# Patient Record
Sex: Male | Born: 1950 | Race: White | Hispanic: No | Marital: Married | Smoking: Current every day smoker
Health system: Southern US, Community
[De-identification: ages and names within clinical notes are randomized; demographics above are authoritative.]

## PROBLEM LIST (undated history)

## (undated) DIAGNOSIS — N529 Male erectile dysfunction, unspecified: Secondary | ICD-10-CM

## (undated) DIAGNOSIS — K5790 Diverticulosis of intestine, part unspecified, without perforation or abscess without bleeding: Secondary | ICD-10-CM

## (undated) DIAGNOSIS — H269 Unspecified cataract: Secondary | ICD-10-CM

## (undated) DIAGNOSIS — I10 Essential (primary) hypertension: Secondary | ICD-10-CM

## (undated) DIAGNOSIS — E119 Type 2 diabetes mellitus without complications: Secondary | ICD-10-CM

## (undated) DIAGNOSIS — E785 Hyperlipidemia, unspecified: Secondary | ICD-10-CM

## (undated) DIAGNOSIS — E669 Obesity, unspecified: Secondary | ICD-10-CM

## (undated) DIAGNOSIS — E1140 Type 2 diabetes mellitus with diabetic neuropathy, unspecified: Secondary | ICD-10-CM

## (undated) DIAGNOSIS — K635 Polyp of colon: Secondary | ICD-10-CM

## (undated) HISTORY — DX: Polyp of colon: K63.5

## (undated) HISTORY — DX: Obesity, unspecified: E66.9

## (undated) HISTORY — DX: Male erectile dysfunction, unspecified: N52.9

## (undated) HISTORY — DX: Essential (primary) hypertension: I10

## (undated) HISTORY — DX: Hyperlipidemia, unspecified: E78.5

## (undated) HISTORY — DX: Diverticulosis of intestine, part unspecified, without perforation or abscess without bleeding: K57.90

## (undated) HISTORY — DX: Unspecified cataract: H26.9

## (undated) HISTORY — DX: Type 2 diabetes mellitus with diabetic neuropathy, unspecified: E11.40

## (undated) HISTORY — PX: APPENDECTOMY: SHX54

## (undated) HISTORY — PX: TONSILLECTOMY: SUR1361

## (undated) HISTORY — DX: Type 2 diabetes mellitus without complications: E11.9

---

## 1972-01-16 HISTORY — PX: OTHER SURGICAL HISTORY: SHX169

## 1972-01-16 HISTORY — PX: TESTICLE REMOVAL: SHX68

## 1972-01-16 HISTORY — PX: INGUINAL HERNIA REPAIR: SHX194

## 1979-01-16 HISTORY — PX: VASECTOMY: SHX75

## 1984-01-16 HISTORY — PX: KNEE ARTHROSCOPY: SUR90

## 2001-04-08 ENCOUNTER — Emergency Department (HOSPITAL_COMMUNITY): Admission: EM | Admit: 2001-04-08 | Discharge: 2001-04-08 | Payer: Self-pay | Admitting: Emergency Medicine

## 2001-10-29 ENCOUNTER — Emergency Department (HOSPITAL_COMMUNITY): Admission: EM | Admit: 2001-10-29 | Discharge: 2001-10-29 | Payer: Self-pay | Admitting: Emergency Medicine

## 2002-01-07 ENCOUNTER — Emergency Department (HOSPITAL_COMMUNITY): Admission: EM | Admit: 2002-01-07 | Discharge: 2002-01-07 | Payer: Self-pay | Admitting: Emergency Medicine

## 2002-01-07 ENCOUNTER — Encounter: Payer: Self-pay | Admitting: Emergency Medicine

## 2002-06-30 ENCOUNTER — Encounter: Admission: RE | Admit: 2002-06-30 | Discharge: 2002-06-30 | Payer: Self-pay | Admitting: Sports Medicine

## 2002-06-30 ENCOUNTER — Encounter: Payer: Self-pay | Admitting: Diagnostic Radiology

## 2002-06-30 ENCOUNTER — Encounter: Payer: Self-pay | Admitting: Sports Medicine

## 2002-07-15 ENCOUNTER — Encounter: Payer: Self-pay | Admitting: Sports Medicine

## 2002-07-15 ENCOUNTER — Encounter: Admission: RE | Admit: 2002-07-15 | Discharge: 2002-07-15 | Payer: Self-pay | Admitting: Sports Medicine

## 2002-07-29 ENCOUNTER — Encounter: Payer: Self-pay | Admitting: Sports Medicine

## 2002-07-29 ENCOUNTER — Encounter: Admission: RE | Admit: 2002-07-29 | Discharge: 2002-07-29 | Payer: Self-pay | Admitting: Sports Medicine

## 2003-06-02 ENCOUNTER — Emergency Department (HOSPITAL_COMMUNITY): Admission: EM | Admit: 2003-06-02 | Discharge: 2003-06-02 | Payer: Self-pay | Admitting: Emergency Medicine

## 2007-01-16 HISTORY — PX: REPLACEMENT UNICONDYLAR JOINT KNEE: SUR1227

## 2008-02-12 ENCOUNTER — Ambulatory Visit: Payer: Self-pay | Admitting: Family Medicine

## 2008-02-12 DIAGNOSIS — E785 Hyperlipidemia, unspecified: Secondary | ICD-10-CM | POA: Insufficient documentation

## 2008-02-12 DIAGNOSIS — E1169 Type 2 diabetes mellitus with other specified complication: Secondary | ICD-10-CM | POA: Insufficient documentation

## 2008-02-12 DIAGNOSIS — N521 Erectile dysfunction due to diseases classified elsewhere: Secondary | ICD-10-CM

## 2008-02-12 DIAGNOSIS — I10 Essential (primary) hypertension: Secondary | ICD-10-CM | POA: Insufficient documentation

## 2008-03-08 ENCOUNTER — Ambulatory Visit: Payer: Self-pay | Admitting: Gastroenterology

## 2008-03-17 ENCOUNTER — Encounter: Payer: Self-pay | Admitting: Family Medicine

## 2008-03-19 ENCOUNTER — Encounter (INDEPENDENT_AMBULATORY_CARE_PROVIDER_SITE_OTHER): Payer: Self-pay | Admitting: *Deleted

## 2008-03-19 ENCOUNTER — Ambulatory Visit: Payer: Self-pay | Admitting: Family Medicine

## 2008-03-19 LAB — CONVERTED CEMR LAB
Cholesterol: 116 mg/dL (ref 0–200)
HDL: 27.4 mg/dL — ABNORMAL LOW (ref >39.0)
LDL Cholesterol: 58 mg/dL (ref 0–99)
Total CHOL/HDL Ratio: 4.2
Triglycerides: 153 mg/dL — ABNORMAL HIGH (ref 0–149)
VLDL: 31 mg/dL (ref 0–40)

## 2008-03-22 ENCOUNTER — Encounter: Payer: Self-pay | Admitting: Gastroenterology

## 2008-03-22 ENCOUNTER — Ambulatory Visit: Payer: Self-pay | Admitting: Gastroenterology

## 2008-03-24 ENCOUNTER — Encounter: Payer: Self-pay | Admitting: Gastroenterology

## 2008-03-26 ENCOUNTER — Telehealth: Payer: Self-pay | Admitting: Family Medicine

## 2008-03-29 ENCOUNTER — Ambulatory Visit: Payer: Self-pay | Admitting: Family Medicine

## 2008-03-29 LAB — CONVERTED CEMR LAB
ALT: 26 units/L (ref 0–53)
AST: 16 units/L (ref 0–37)
Albumin: 4.1 g/dL (ref 3.5–5.2)
Alkaline Phosphatase: 107 units/L (ref 39–117)
BUN: 15 mg/dL (ref 6–23)
Basophils Absolute: 0.1 10*3/uL (ref 0.0–0.1)
Basophils Relative: 0.6 % (ref 0.0–3.0)
Bilirubin, Direct: 0.1 mg/dL (ref 0.0–0.3)
CO2: 28 meq/L (ref 19–32)
Calcium: 9 mg/dL (ref 8.4–10.5)
Chloride: 101 meq/L (ref 96–112)
Creatinine, Ser: 1 mg/dL (ref 0.4–1.5)
Creatinine,U: 163.2 mg/dL
Eosinophils Absolute: 0.1 10*3/uL (ref 0.0–0.7)
Eosinophils Relative: 1.7 % (ref 0.0–5.0)
GFR calc Af Amer: 100 mL/min
GFR calc non Af Amer: 82 mL/min
Glucose, Bld: 388 mg/dL — ABNORMAL HIGH (ref 70–99)
HCT: 48 % (ref 39.0–52.0)
Hemoglobin: 16.7 g/dL (ref 13.0–17.0)
Hgb A1c MFr Bld: 10.9 % — ABNORMAL HIGH (ref 4.6–6.0)
Lymphocytes Relative: 28.5 % (ref 12.0–46.0)
MCHC: 34.8 g/dL (ref 30.0–36.0)
MCV: 96.1 fL (ref 78.0–100.0)
Microalb Creat Ratio: 14.1 mg/g (ref 0.0–30.0)
Microalb, Ur: 2.3 mg/dL — ABNORMAL HIGH (ref 0.0–1.9)
Monocytes Absolute: 0.6 10*3/uL (ref 0.1–1.0)
Monocytes Relative: 7.2 % (ref 3.0–12.0)
Neutro Abs: 5.3 10*3/uL (ref 1.4–7.7)
Neutrophils Relative %: 62 % (ref 43.0–77.0)
Platelets: 214 10*3/uL (ref 150–400)
Potassium: 4.4 meq/L (ref 3.5–5.1)
RBC: 5 M/uL (ref 4.22–5.81)
RDW: 11.9 % (ref 11.5–14.6)
Sodium: 137 meq/L (ref 135–145)
Total Bilirubin: 1.2 mg/dL (ref 0.3–1.2)
Total Protein: 7 g/dL (ref 6.0–8.3)
WBC: 8.5 10*3/uL (ref 4.5–10.5)

## 2008-04-01 ENCOUNTER — Ambulatory Visit: Payer: Self-pay | Admitting: Family Medicine

## 2008-04-01 DIAGNOSIS — E1142 Type 2 diabetes mellitus with diabetic polyneuropathy: Secondary | ICD-10-CM | POA: Insufficient documentation

## 2008-04-01 DIAGNOSIS — E1140 Type 2 diabetes mellitus with diabetic neuropathy, unspecified: Secondary | ICD-10-CM | POA: Insufficient documentation

## 2008-04-01 DIAGNOSIS — E1165 Type 2 diabetes mellitus with hyperglycemia: Secondary | ICD-10-CM

## 2008-04-01 DIAGNOSIS — Z794 Long term (current) use of insulin: Secondary | ICD-10-CM | POA: Insufficient documentation

## 2008-05-03 ENCOUNTER — Ambulatory Visit: Payer: Self-pay | Admitting: Family Medicine

## 2008-05-05 ENCOUNTER — Telehealth: Payer: Self-pay | Admitting: Family Medicine

## 2008-06-08 ENCOUNTER — Ambulatory Visit: Payer: Self-pay | Admitting: Family Medicine

## 2008-10-05 ENCOUNTER — Ambulatory Visit: Payer: Self-pay | Admitting: Family Medicine

## 2008-10-10 LAB — CONVERTED CEMR LAB: Hgb A1c MFr Bld: 7.1 % — ABNORMAL HIGH (ref 4.6–6.5)

## 2008-10-13 ENCOUNTER — Ambulatory Visit: Payer: Self-pay | Admitting: Family Medicine

## 2009-01-13 ENCOUNTER — Ambulatory Visit: Payer: Self-pay | Admitting: Family Medicine

## 2009-01-24 ENCOUNTER — Telehealth: Payer: Self-pay | Admitting: Family Medicine

## 2009-02-17 ENCOUNTER — Ambulatory Visit: Payer: Self-pay | Admitting: Family Medicine

## 2009-02-17 DIAGNOSIS — J069 Acute upper respiratory infection, unspecified: Secondary | ICD-10-CM | POA: Insufficient documentation

## 2009-02-17 LAB — CONVERTED CEMR LAB: Rapid Strep: NEGATIVE

## 2009-04-04 ENCOUNTER — Ambulatory Visit: Payer: Self-pay | Admitting: Family Medicine

## 2009-04-05 LAB — CONVERTED CEMR LAB
ALT: 20 units/L (ref 0–53)
AST: 13 units/L (ref 0–37)
BUN: 16 mg/dL (ref 6–23)
Bilirubin, Direct: 0.1 mg/dL (ref 0.0–0.3)
CO2: 26 meq/L (ref 19–32)
Calcium: 9.5 mg/dL (ref 8.4–10.5)
Cholesterol: 137 mg/dL (ref 0–200)
Glucose, Bld: 350 mg/dL — ABNORMAL HIGH (ref 70–99)
HDL: 44.4 mg/dL (ref >39.00)
Sodium: 137 meq/L (ref 135–145)
Total Bilirubin: 0.8 mg/dL (ref 0.3–1.2)
Total Protein: 7.4 g/dL (ref 6.0–8.3)

## 2009-05-19 ENCOUNTER — Ambulatory Visit: Payer: Self-pay | Admitting: Family Medicine

## 2009-05-19 DIAGNOSIS — F17200 Nicotine dependence, unspecified, uncomplicated: Secondary | ICD-10-CM | POA: Insufficient documentation

## 2009-05-30 ENCOUNTER — Encounter: Payer: Self-pay | Admitting: Family Medicine

## 2009-06-02 ENCOUNTER — Encounter: Payer: Self-pay | Admitting: Family Medicine

## 2009-06-23 ENCOUNTER — Telehealth: Payer: Self-pay | Admitting: Family Medicine

## 2009-07-27 ENCOUNTER — Ambulatory Visit: Payer: Self-pay | Admitting: Family Medicine

## 2009-07-27 LAB — CONVERTED CEMR LAB: Hgb A1c MFr Bld: 8.1 % — ABNORMAL HIGH (ref 4.6–6.5)

## 2009-10-03 ENCOUNTER — Telehealth: Payer: Self-pay | Admitting: Family Medicine

## 2009-11-17 ENCOUNTER — Ambulatory Visit: Payer: Self-pay | Admitting: Family Medicine

## 2009-11-17 DIAGNOSIS — J157 Pneumonia due to Mycoplasma pneumoniae: Secondary | ICD-10-CM | POA: Insufficient documentation

## 2009-11-30 ENCOUNTER — Telehealth: Payer: Self-pay | Admitting: Family Medicine

## 2010-02-16 NOTE — Progress Notes (Signed)
Summary: refill request for metformin  Phone Note Refill Request Message from:  Fax from Pharmacy  Refills Requested: Medication #1:  METFORMIN HCL 500 MG XR24H-TAB 4 by mouth daily Faxed request from [facility].  Request is for 3 tablets daily, chart says 4.  Please advise.  Initial call taken by: Lowella Petties CMA,  June 23, 2009 8:46 AM  Follow-up for Phone Call        refil 4 tabs, 3 is  wrong  Call in to the pharmacy of their choice Call in #30, 11 refills. OR if they prefer a 90 day supply, #90 with 3 refills is OK, too Prescription instructions above  Follow-up by: Hannah Beat MD,  June 23, 2009 9:02 AM    Prescriptions: METFORMIN HCL 500 MG XR24H-TAB (METFORMIN HCL) 4 by mouth daily  #360 x 4   Entered by:   Delilah Shan CMA (AAMA)   Authorized by:   Hannah Beat MD   Signed by:   Delilah Shan CMA (AAMA) on 06/23/2009   Method used:   Electronically to        [facility]* (mail-order)             ,          Ph: [phone]       Fax: [phone]   RxID:   [id number]

## 2010-02-16 NOTE — Progress Notes (Signed)
Summary: Amoxicillin  Phone Note Refill Request Message from:  [facility]line on January 24, 2009 9:01 AM  Refills Requested: Medication #1:  AMOX TR-K CLV 500-125 MG TAB [facility]  [address] [id number]   Last Fill Date:  No date sent   Pharmacy Phone:  [phone]   Method Requested: Electronic Initial call taken by: Delilah Shan CMA Duncan Dull),  January 24, 2009 9:01 AM  Follow-up for Phone Call        i would not refill augmentin  ok to call in zpak, 2 by mouth today, then 1 by mouth x 4 days. #6 pills, no refills  f/u with me if not better after this Follow-up by: Hannah Beat MD,  January 24, 2009 9:20 AM  Additional Follow-up for Phone Call Additional follow up Details #1::        done Additional Follow-up by: Benny Lennert CMA Duncan Dull),  January 24, 2009 9:34 AM    New/Updated Medications: ZITHROMAX 250 MG TABS (AZITHROMYCIN) take 2 today and 1 a day for 4 days Prescriptions: ZITHROMAX 250 MG TABS (AZITHROMYCIN) take 2 today and 1 a day for 4 days  #6 x 0   Entered by:   Benny Lennert CMA (AAMA)   Authorized by:   Hannah Beat MD   Signed by:   Benny Lennert CMA (AAMA) on 01/24/2009   Method used:   [facility]        [facility]  [address] [phone][facility] (retail)       [address]       [place], [place]  [address]       Ph: [phone]       Fax: [phone]   RxID:   [id number]

## 2010-02-16 NOTE — Assessment & Plan Note (Signed)
Summary: B RONCHITIS/DLO   Vital Signs:  Patient profile:   60 year old male Height:      72.25 inches Weight:      215.0 pounds BMI:     29.06 Temp:     98.1 degrees F oral Pulse rate:   72 / minute Pulse rhythm:   regular BP sitting:   120 / 90  (left arm) Cuff size:   large  Vitals Entered By: Benny Lennert CMA Duncan Dull) (November 17, 2009 8:04 AM)  History of Present Illness: Chief complaint Bronchitis  60 year old:    Acute Visit History:      The patient complains of cough, fever, and nasal discharge.  These symptoms began 3 weeks ago.  He denies abdominal pain, chest pain, earache, headache, musculoskeletal symptoms, nausea, rash, sinus problems, and sore throat.  Other comments include: + 30 years tob.        His highest temperature has been subj.        The patient notes wheezing.  The character of the cough is described as productive.  There is no history of sleep interference, shortness of breath, respiratory retractions, tachypnea, cyanosis, or interference with oral intake associated with his cough.        Urine output has been normal.  He is tolerating clear liquids.        Allergies (verified): No Known Drug Allergies  Past History:  Past medical, surgical, family and social histories (including risk factors) reviewed, and no changes noted (except as noted below).  Past Medical History: Reviewed history from 04/01/2008 and no changes required. Diabetes Mellitus, Type 2 Hyperlipidemia Hypertension ED, rare Obesity Diverticulosis Colonic Polyps  GI = Christella Hartigan  Past Surgical History: Reviewed history from 03/24/2008 and no changes required. Appendectomy Inguinal herniorrhaphy, 1974 Tonsillectomy L hydrocoele surgery, 1974 L testicle removed, 1974 R unicondylar knee replacement, 2009 Vasectomy, 1981 Arthroscopy, R knee, 1986  Colonoscopy, 03/2008, diverticulosis, polyps, Christella Hartigan  Family History: Reviewed history from 02/12/2008 and no changes  required. Alcoholism, Drug Addiction: 0 Colon CA: 0 Ovarian/Uterine CA: 0 Breast CA: 0 Lung CA: 0 Prostate CA: 0 CAD: 0 CVA: 0 Sudden death < 50: 0 DM: Father, Sister Mental Illness: ? mother  Father, alive, 18, mouth CA Mother, alive, 15  Social History: Reviewed history from 02/12/2008 and no changes required. 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 CV: No chest pain or SOB GI: No noted N or V Otherwise, pertinent positives and negatives are noted in the HPI.   Physical Exam  Additional Exam:  GEN: A and O x 3. WDWN. NAD.    ENT: Nose clear, ext NML.  No LAD.  No JVD.  TM's clear. Oropharynx clear.  PULM: Normal WOB, no distress. scattered LL crackles B and rare wheezes CV: RRR, no M/G/R, No rubs, No JVD.   ABD: S, NT, ND, + BS. No rebound. No guarding. No HSM.   EXT: warm and well-perfused, No c/c/e. PSYCH: Pleasant and conversant.    Impression & Recommendations:  Problem # 1:  WALKING PNEUMONIA (ICD-483.0) Assessment New 3 week cover with abx fluids, cough meds  His updated medication list for this problem includes:    Doxycycline Hyclate 100 Mg Caps (Doxycycline hyclate) .[name]... Take 1 tab twice a day  Problem # 2:  TOBACCO USE (ICD-305.1)  Complete  Medication List: 1)  Ramipril 10 Mg Caps (Ramipril) .[name].. 1 by mouth q day 2)  Baby Aspirin 81mg   .... Take one tablet once daily 3)  Multivitamin  .... Take one tablet once daily 4)  Levitra 10 Mg Tabs (Vardenafil hcl) .... One by mouth 30 minutes prior to intercourse 5)  Metformin Hcl 500 Mg Xr24h-tab (Metformin hcl) .... 4 by mouth daily 6)  Glucometer Elite Classic Kit (Blood glucose monitoring suppl) .... 250.00 7)  Glucometer Elite Test Strp (Glucose blood) .... Check blood sugar twice daily 8)  Glucometer Lancets  .Marland KitchenMarland Kitchen. 250.00 9)  Caduet 5-20 Mg  Tabs (Amlodipine-atorvastatin) .[name].. 1 by mouth daily 10)  Onglyza 5 Mg Tabs (Saxagliptin hcl) .[name].. 1 by mouth daily 11)  Doxycycline Hyclate 100 Mg Caps (Doxycycline hyclate) .... Take 1 tab twice a day Prescriptions: DOXYCYCLINE HYCLATE 100 MG CAPS (DOXYCYCLINE HYCLATE) Take 1 tab twice a day  #20 x 0   Entered and Authorized by:   Hannah Beat MD   Signed by:   Hannah Beat MD on 11/17/2009   Method used:   [facility]        [facility]  [address] [phone][facility] (retail)       [address]       [place], [place]  [address]       Ph: [phone]       Fax: [phone]   RxID:   [id number]    Orders Added: 1)  Est. Patient Level IV [88416]    Current Allergies (reviewed today): No known allergies

## 2010-02-16 NOTE — Progress Notes (Signed)
Summary: doxcycline  Phone Note Refill Request Message from:  [facility] on November 30, 2009 8:25 AM  Refills Requested: Medication #1:  doxcycline   [facility] Requested[facility] [address]   Method Requested: Electronic Initial call taken by: Benny Lennert CMA Duncan Dull),  November 30, 2009 8:25 AM  Follow-up for Phone Call        Pt was being treated for pneumonia.  He still has cough with green mucous.  No fever.  I advised his wife that the cough will linger.  She says he is better but still has the green mucous.  Call wife and let her knowRosey Bath, [phone]. Follow-up by: Lowella Petties CMA, AAMA,  November 30, 2009 2:37 PM  Additional Follow-up for Phone Call Additional follow up Details #1::        Patient wife calling saying that patient will not be able to come in for the next 3 week and wants to know if this can be refilled.Consuello Masse CMA   Additional Follow-up by: Benny Lennert CMA Duncan Dull),  December 01, 2009 4:27 PM    Additional Follow-up for Phone Call Additional follow up Details #2::    i know him well, would broaden abx avelox 400 mg, 1 by mouth daily x 10 days, #10 Follow-up by: Hannah Beat MD,  December 02, 2009 10:21 AM  Additional Follow-up for Phone Call Additional follow up Details #3:: Details for Additional Follow-up Action Taken: [facility], advised his wife.            Lowella Petties CMA, AAMA  December 02, 2009 11:31 AM   New/Updated Medications: AVELOX 400 MG TABS (MOXIFLOXACIN HCL) take one by mouth daily x 10 days  Prior Medications: RAMIPRIL 10 MG CAPS (RAMIPRIL) 1 by mouth q day BABY ASPIRIN 81MG  () take one tablet once daily MULTIVITAMIN () take one tablet once daily LEVITRA 10 MG  TABS (VARDENAFIL HCL) one by mouth 30 minutes prior to intercourse METFORMIN HCL 500 MG XR24H-TAB (METFORMIN HCL) 4 by mouth daily GLUCOMETER ELITE CLASSIC  KIT (BLOOD GLUCOSE MONITORING SUPPL) 250.00 GLUCOMETER ELITE TEST  STRP (GLUCOSE  BLOOD) Check blood sugar twice daily GLUCOMETER LANCETS () 250.00 CADUET 5-20 MG TABS (AMLODIPINE-ATORVASTATIN) 1 by mouth daily ONGLYZA 5 MG TABS (SAXAGLIPTIN HCL) 1 by mouth daily AVELOX 400 MG TABS (MOXIFLOXACIN HCL) take one by mouth daily x 10 days Current Allergies: No known allergies

## 2010-02-16 NOTE — Assessment & Plan Note (Signed)
Summary: CPX/RBH   Vital Signs:  Patient profile:   60 year old male Height:      72.25 inches Weight:      209.4 pounds BMI:     28.31 Temp:     98.1 degrees F oral Pulse rate:   72 / minute Pulse rhythm:   regular BP sitting:   140 / 90  (left arm) Cuff size:   large  Vitals Entered By: Benny Lennert CMA Duncan Dull) (May 19, 2009 8:25 AM)   History of Present Illness: Chief complaint cpx  60 year old male:  DM: very out of control, A1c = 12.   Diabetes Management History:      The patient is a 60 years old male who comes in for evaluation of DM Type 2.  He is (or has been) enrolled in the "Diabetic Education Program".  He states understanding of dietary principles but he is not following the appropriate diet.  No sensory loss is reported.  Self foot exams are not being performed.  He is checking home blood sugars.  He says that he is exercising.  Type of exercise includes: walking.  Duration of exercise is estimated to be 90 min.  He is doing this 3 times per week.        Hypoglycemic symptoms are not occurring.  No hyperglycemic symptoms are reported.        Symptoms which suggest diabetic complications include sexual dysfunction and paresthesias.  No changes have been made to his treatment plan since last visit.    Preventive Screening-Counseling & Management  Alcohol-Tobacco     Smoking Status: current     Smoking Cessation Counseling: yes     Smoke Cessation Stage: contemplative     Packs/Day: 0.5     Tobacco Counseling: to quit use of tobacco products  Caffeine-Diet-Exercise     Diet Counseling: to improve diet; diet is suboptimal     Does Patient Exercise: yes     Type of exercise: walking     Exercise (avg: min/session): 90 min.     Times/week: 3  Hep-HIV-STD-Contraception     STD Risk: no risk noted     Testicular SE Education/Counseling to perform regular STE      Sexual History:  currently monogamous.        Drug Use:  never.    Clinical Review  Panels:  Prevention   Last Colonoscopy:  Location:  [facility].  (03/22/2008)   Last PSA:  1.23 (04/04/2009)  Lipid Management   Cholesterol:  137 (04/04/2009)   LDL (bad choesterol):  56 (04/04/2009)   HDL (good cholesterol):  44.40 (04/04/2009)  Diabetes Management   HgBA1C:  12.4 (04/04/2009)   Creatinine:  0.7 (04/04/2009)   Last Dilated Eye Exam:  rec exam (05/19/2009)   Last Foot Exam:  yes (05/19/2009)  CBC   WBC:  8.5 (03/29/2008)   RBC:  5.00 (03/29/2008)   Hgb:  16.7 (03/29/2008)   Hct:  48.0 (03/29/2008)   Platelets:  214 (03/29/2008)   MCV  96.1 (03/29/2008)   MCHC  34.8 (03/29/2008)   RDW  11.9 (03/29/2008)   PMN:  62.0 (03/29/2008)   Lymphs:  28.5 (03/29/2008)   Monos:  7.2 (03/29/2008)   Eosinophils:  1.7 (03/29/2008)   Basophil:  0.6 (03/29/2008)  Complete Metabolic Panel   Glucose:  350 (04/04/2009)   Sodium:  137 (04/04/2009)   Potassium:  4.9 (04/04/2009)   Chloride:  107 (04/04/2009)   CO2:  26 (04/04/2009)   BUN:  16 (04/04/2009)   Creatinine:  0.7 (04/04/2009)   Albumin:  4.3 (04/04/2009)   Total Protein:  7.4 (04/04/2009)   Calcium:  9.5 (04/04/2009)   Total Bili:  0.8 (04/04/2009)   Alk Phos:  74 (04/04/2009)   SGPT (ALT):  20 (04/04/2009)   SGOT (AST):  13 (04/04/2009)   Allergies (verified): No Known Drug Allergies  Past History:  Past medical, surgical, family and social histories (including risk factors) reviewed, and no changes noted (except as noted below).  Past Medical History: Reviewed history from 04/01/2008 and no changes required. Diabetes Mellitus, Type 2 Hyperlipidemia Hypertension ED, rare Obesity Diverticulosis Colonic Polyps  GI = Christella Hartigan  Past Surgical History: Reviewed history from 03/24/2008 and no changes required. Appendectomy Inguinal herniorrhaphy, 1974 Tonsillectomy L hydrocoele surgery, 1974 L testicle removed, 1974 R unicondylar knee replacement, 2009 Vasectomy,  1981 Arthroscopy, R knee, 1986  Colonoscopy, 03/2008, diverticulosis, polyps, Christella Hartigan  Family History: Reviewed history from 02/12/2008 and no changes required. Alcoholism, Drug Addiction: 0 Colon CA: 0 Ovarian/Uterine CA: 0 Breast CA: 0 Lung CA: 0 Prostate CA: 0 CAD: 0 CVA: 0 Sudden death < 50: 0 DM: Father, Sister Mental Illness: ? mother  Father, alive, 16, mouth CA Mother, alive, 21  Social History: Reviewed history from 02/12/2008 and no changes required. 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 Rare ETOH, No Drugs Regular exercise-no Smoking Status:  current Packs/Day:  0.5 STD Risk:  no risk noted Sexual History:  currently monogamous Drug Use:  never  Diabetes Management Exam:    Foot Exam (with socks and/or shoes not present):       Sensory-Pinprick/Light touch:          Left medial foot (L-4): normal          Left dorsal foot (L-5): normal          Left lateral foot (S-1): normal          Right medial foot (L-4): normal          Right dorsal foot (L-5): normal          Right lateral foot (S-1): normal       Sensory-Monofilament:          Left foot: normal          Right foot: normal       Inspection:          Left foot: normal          Right foot: normal       Nails:          Left foot: normal          Right foot: normal    Eye Exam:       Eye Exam done elsewhere          Date: 05/19/2009          Results: rec exam   Impression & Recommendations:  Problem # 1:  HEALTH MAINTENANCE EXAM (ICD-V70.0) The patient's preventative maintenance and recommended screening tests for an annual wellness exam were reviewed in full today. Brought up to date unless services declined.  Counselled on the importance of diet, exercise, and its role in overall health and mortality. The patient's FH and SH was reviewed, including their home life, tobacco status, and drug and alcohol status.   Stop smoking, exercise more.    Problem # 2:  AODM (ICD-250.00) Assessment: Deteriorated Doing poorly add onglyza and  f/u  His updated medication list for this problem includes:    Ramipril 10 Mg Caps (Ramipril) .[name]... 1 by mouth q day    Metformin Hcl 500 Mg Xr24h-tab (Metformin hcl) .Marland KitchenMarland KitchenMarland KitchenMarland Kitchen 4 by mouth daily    Onglyza 5 Mg Tabs (Saxagliptin hcl) .[name]... 1 by mouth daily  Labs Reviewed: Creat: 0.7 (04/04/2009)     Last Eye Exam: rec exam (05/19/2009) Reviewed HgBA1c results: 12.4 (04/04/2009)  7.1 (10/05/2008)  Problem # 3:  TOBACCO USE (ICD-305.1) The patient does smoke cigarettes, and we discussed that tobacco is harmful to one's overall health, and that likely quitting smoking would be the single best thing that they could do for their health.   Problem # 4:  ERECTILE DYSFUNCTION (ICD-302.72) Assessment: Unchanged  His updated medication list for this problem includes:    Levitra 10 Mg Tabs (Vardenafil hcl) ..... One by mouth 30 minutes prior to intercourse  Problem # 5:  HYPERLIPIDEMIA (ICD-272.4)  His updated medication list for this problem includes:    Caduet 5-20 Mg Tabs (Amlodipine-atorvastatin) .[name]... 1 by mouth daily  Problem # 6:  HYPERTENSION (ICD-401.9) Assessment: Deteriorated Patient reports that this an anomally and has been normal at home will recheck at next office visit  His updated medication list for this problem includes:    Ramipril 10 Mg Caps (Ramipril) .[name]... 1 by mouth q day    Caduet 5-20 Mg Tabs (Amlodipine-atorvastatin) .[name]... 1 by mouth daily  BP today: 140/90 Prior BP: 142/64 (02/17/2009)  Labs Reviewed: K+: 4.9 (04/04/2009) Creat: : 0.7 (04/04/2009)   Chol: 137 (04/04/2009)   HDL: 44.40 (04/04/2009)   LDL: 56 (04/04/2009)   TG: 182.0 (04/04/2009)  Complete Medication List: 1)  Ramipril 10 Mg Caps (Ramipril) .[name].. 1 by mouth q day 2)  Baby Aspirin 81mg   .... Take one tablet once daily 3)  Multivitamin  .... Take one tablet once daily 4)  Levitra 10 Mg Tabs (Vardenafil  hcl) .... One by mouth 30 minutes prior to intercourse 5)  Metformin Hcl 500 Mg Xr24h-tab (Metformin hcl) .... 4 by mouth daily 6)  Glucometer Elite Classic Kit (Blood glucose monitoring suppl) .... 250.00 7)  Glucometer Elite Test Strp (Glucose blood) .... Check blood sugar twice daily 8)  Glucometer Lancets  .Marland KitchenMarland Kitchen. 250.00 9)  Caduet 5-20 Mg Tabs (Amlodipine-atorvastatin) .[name].. 1 by mouth daily 10)  Onglyza 5 Mg Tabs (Saxagliptin hcl) .[name].. 1 by mouth daily  Diabetes Management Assessment/Plan:      The following lipid goals have been established for the patient: Total cholesterol goal of 200; LDL cholesterol goal of 100; HDL cholesterol goal of 40; Triglyceride goal of 200.    Patient Instructions: 1)  f/u 1 month Prescriptions: ONGLYZA 5 MG TABS (SAXAGLIPTIN HCL) 1 by mouth daily  #30 x 1   Entered and Authorized by:   Hannah Beat MD   Signed by:   Hannah Beat MD on 05/19/2009   Method used:   [facility]  [address] [phone][facility] ([facility]ail)       [address]       [place], [place]  [address]       Ph: [phone]       Fax: [phone]   RxID:   [id number]   Current Allergies (reviewed today): No known allergies     Review of  Systems General: denies fatigue, malaise, fever, weight loss Eyes: denies blurring,  diplopia, irritation, discharge Ear/Nose/Throat: denies ear pain or discharge, nasal obstruction or discharge, sore throat Cardiovascular: denies chest pain, palpitations, paroxysmal nocturnal dyspnea, orthopnea, edema Respiratory: denies coughing, wheezing, dyspnea, hemoptysis Gastrointestinal: denies abdominal pain, dysphagia, nausea, vomiting, diarrhea, constipation Genitourinary: denies hematuria, frequency, urgency, dysuria, discharge, impotence, incontinence Musculoskeletal: denies back pain, joint swelling, joint stiffness, joint pain Skin: denies rashes, itching, lumps, sores, lesions, color change Neurologic:  denies syncope, seizures, transient paralysis, weakness, SOME OCC PARASTHESIAS ON BOTTOM OF FEET Psychiatric: denies depression, anxiety, mental disturbance, difficulty sleeping, suicidal ideation, hallucinations, paranoia Endocrine: denies polyuria, polydipsia, polyphagia, weight change, heat or cold intolerance Heme/Lymphatic: denies easy or excessive bruising, history of blood transfusions, anemia, bleeding disorders, adenopathy, chills, sweats Allergic/Immunologic: denies urticaria, hay fever, frequent UTIs; denies HIV high risk behaviors   Physical Exam General Appearance: well developed, well nourished, no acute distress Eyes: conjunctiva and lids normal, PERRLA, EOMI Ears, Nose, Mouth, Throat: TM clear, nares clear, oral exam WNL Neck: supple, no lymphadenopathy, no thyromegaly, no JVD Respiratory: clear to auscultation and percussion, respiratory effort normal Cardiovascular: regular rate and rhythm, S1-S2, no murmur, rub or gallop, no bruits, peripheral pulses normal and symmetric, no cyanosis, clubbing, edema or varicosities Chest: no scars, masses, tenderness; no asymmetry, skin changes, nipple discharge, no gynecomastia   Gastrointestinal: soft, non-tender; no hepatosplenomegaly, masses; active bowel sounds all quadrants,  no masses, tenderness, hemorrhoids  Genitourinary: no hernia, testicular mass, penile discharge, priapism or prostate enlargement Lymphatic: no cervical, axillary or inguinal adenopathy Musculoskeletal: gait normal, muscle tone and strength WNL, no joint swelling, effusions, discoloration, crepitus  Skin: clear, good turgor, color WNL, no rashes, lesions, or ulcerations Neurologic: normal mental status, normal reflexes, normal strength, sensation, and motion Psychiatric: alert; oriented to person, place and time Other Exam:

## 2010-02-16 NOTE — Assessment & Plan Note (Signed)
Summary: 1 M F/U DLO   Vital Signs:  Patient profile:   60 year old male Height:      72.25 inches Weight:      210.6 pounds BMI:     28.47 Temp:     98.6 degrees F oral Pulse rate:   72 / minute Pulse rhythm:   regular BP sitting:   140 / 100  (left arm) Cuff size:   large  Vitals Entered By: Benny Lennert CMA Duncan Dull) (July 27, 2009 8:10 AM)  History of Present Illness: Chief complaint 1 month follow up  DM: improved, looking at [facility] compared to prior A1c of 12, needs recheck today. Doing better with diet  HTN, 120/78 on my recheck in the office. drank half a pot of coffee and was really active before initially coming in.  Lipids, at goal, reviewed labs from work.  ED, with rare levitra use, usually does not need  Valorie Roosevelt and Gamble.   120/78  Allergies (verified): No Known Drug Allergies  Past History:  Past medical, surgical, family and social histories (including risk factors) reviewed, and no changes noted (except as noted below).  Past Medical History: Reviewed history from 04/01/2008 and no changes required. Diabetes Mellitus, Type 2 Hyperlipidemia Hypertension ED, rare Obesity Diverticulosis Colonic Polyps  GI = Christella Hartigan  Past Surgical History: Reviewed history from 03/24/2008 and no changes required. Appendectomy Inguinal herniorrhaphy, 1974 Tonsillectomy L hydrocoele surgery, 1974 L testicle removed, 1974 R unicondylar knee replacement, 2009 Vasectomy, 1981 Arthroscopy, R knee, 1986  Colonoscopy, 03/2008, diverticulosis, polyps, Christella Hartigan  Family History: Reviewed history from 02/12/2008 and no changes required. Alcoholism, Drug Addiction: 0 Colon CA: 0 Ovarian/Uterine CA: 0 Breast CA: 0 Lung CA: 0 Prostate CA: 0 CAD: 0 CVA: 0 Sudden death < 50: 0 DM: Father, Sister Mental Illness: ? mother  Father, alive, 75, mouth CA Mother, alive, 69  Social History: Reviewed history from 02/12/2008 and no changes required. Married Scientist, research (physical sciences), [employer] Former Smoker, 30 pack years Rare ETOH, No Drugs Regular exercise-no  Review of Systems       ROS: GEN: No acute illnesses, no fevers, chills, sweats, fatigue, weight loss, or URI sx. GI: No n/v/d Pulm: No SOB, cough, wheezing Interactive and getting along well at home.  Otherwise, ROS is as per the HPI.   Physical Exam  Additional Exam:  GEN: WDWN, NAD, Non-toxic, A & O x 3 HEENT: Atraumatic, Normocephalic. Neck supple. No masses, No LAD. Ears and Nose: No external deformity. CV: RRR, No M/G/R. No JVD. No thrill. No extra heart sounds. PULM: CTA B, no wheezes, crackles, rhonchi. No retractions. No resp. distress. No accessory muscle use. EXTR: No c/c/e NEURO: Normal gait.  PSYCH: Normally interactive. Conversant. Not depressed or anxious appearing.  Calm demeanor.     Impression & Recommendations:  Problem # 1:  AODM (ICD-250.00) Assessment Improved  His updated medication list for this problem includes:    Ramipril 10 Mg Caps (Ramipril) .[name]... 1 by mouth q day    Metformin Hcl 500 Mg Xr24h-tab (Metformin hcl) .Marland KitchenMarland KitchenMarland KitchenMarland Kitchen 4 by mouth daily    Onglyza 5 Mg Tabs (Saxagliptin hcl) .[name]... 1 by mouth daily  Orders: Venipuncture (29528) TLB-A1C / Hgb A1C (Glycohemoglobin) (83036-A1C)  Problem # 2:  HYPERTENSION (ICD-401.9) Assessment: Unchanged at goal  His updated medication list for this problem includes:    Ramipril 10 Mg Caps (Ramipril) .[name]... 1 by mouth q day    Caduet 5-20 Mg Tabs (Amlodipine-atorvastatin) .[name]... 1 by  mouth daily  Problem # 3:  HYPERLIPIDEMIA (ICD-272.4) at goal  His updated medication list for this problem includes:    Caduet 5-20 Mg Tabs (Amlodipine-atorvastatin) .[name]... 1 by mouth daily  Problem # 4:  ERECTILE DYSFUNCTION (ICD-302.72) refill meds  His updated medication list for this problem includes:    Levitra 10 Mg Tabs (Vardenafil hcl) ..... One by mouth 30 minutes prior to intercourse  Complete Medication  List: 1)  Ramipril 10 Mg Caps (Ramipril) .[name].. 1 by mouth q day 2)  Baby Aspirin 81mg   .... Take one tablet once daily 3)  Multivitamin  .... Take one tablet once daily 4)  Levitra 10 Mg Tabs (Vardenafil hcl) .... One by mouth 30 minutes prior to intercourse 5)  Metformin Hcl 500 Mg Xr24h-tab (Metformin hcl) .... 4 by mouth daily 6)  Glucometer Elite Classic Kit (Blood glucose monitoring suppl) .... 250.00 7)  Glucometer Elite Test Strp (Glucose blood) .... Check blood sugar twice daily 8)  Glucometer Lancets  .Marland KitchenMarland Kitchen. 250.00 9)  Caduet 5-20 Mg Tabs (Amlodipine-atorvastatin) .[name].. 1 by mouth daily 10)  Onglyza 5 Mg Tabs (Saxagliptin hcl) .[name].. 1 by mouth daily Prescriptions: LEVITRA 10 MG  TABS (VARDENAFIL HCL) one by mouth 30 minutes prior to intercourse  #10 x 11   Entered and Authorized by:   Hannah Beat MD   Signed by:   Hannah Beat MD on 07/27/2009   Method used:   Print then Give to Patient   RxID:   [id number] ONGLYZA 5 MG TABS (SAXAGLIPTIN HCL) 1 by mouth daily  #90 x 3   Entered and Authorized by:   Hannah Beat MD   Signed by:   Hannah Beat MD on 07/27/2009   Method used:   Print then Give to Patient   RxID:   [id number]   Current Allergies (reviewed today): No known allergies

## 2010-02-16 NOTE — Assessment & Plan Note (Signed)
Summary: FEVER,BODY ACHES,ST/CLE   Vital Signs:  Patient profile:   60 year old male Weight:      221.13 pounds BMI:     29.89 Temp:     98.2 degrees F oral Pulse rate:   72 / minute Pulse rhythm:   regular BP sitting:   142 / 64  (left arm) Cuff size:   large  Vitals Entered By: Linde Gillis CMA Duncan Dull) (February 17, 2009 11:59 AM) CC: fever, sore throat, body aches   Acute Visit History:      The patient complains of cough, nasal discharge, sinus problems, and sore throat.  These symptoms began 3 days ago.  He denies chest pain and fever.        The character of the cough is described as productive.  He has no history of COPD.  There is no history of wheezing, sleep interference, shortness of breath, respiratory retractions, tachypnea, cyanosis, or interference with oral intake associated with his cough.        'Cold' or URI symptoms have been present with the sore throat.  There is no history of dysphagia, drooling, or recent exposure to strep.        Urine output has been normal.  He is tolerating clear liquids.        Allergies (verified): No Known Drug Allergies  Past History:  Past medical, surgical, family and social histories (including risk factors) reviewed, and no changes noted (except as noted below).  Past Medical History: Reviewed history from 04/01/2008 and no changes required. Diabetes Mellitus, Type 2 Hyperlipidemia Hypertension ED, rare Obesity Diverticulosis Colonic Polyps  GI = Christella Hartigan  Past Surgical History: Reviewed history from 03/24/2008 and no changes required. Appendectomy Inguinal herniorrhaphy, 1974 Tonsillectomy L hydrocoele surgery, 1974 L testicle removed, 1974 R unicondylar knee replacement, 2009 Vasectomy, 1981 Arthroscopy, R knee, 1986  Colonoscopy, 03/2008, diverticulosis, polyps, Christella Hartigan  Family History: Reviewed history from 02/12/2008 and no changes required. Alcoholism, Drug Addiction: 0 Colon CA: 0 Ovarian/Uterine CA:  0 Breast CA: 0 Lung CA: 0 Prostate CA: 0 CAD: 0 CVA: 0 Sudden death < 50: 0 DM: Father, Sister Mental Illness: ? mother  Father, alive, 46, mouth CA Mother, alive, 9  Social History: Reviewed history from 02/12/2008 and no changes required. 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 CV: No chest pain or SOB GI: No noted N or V Otherwise, pertinent positives and negatives are noted in the HPI.   Physical Exam  Additional Exam:  GEN: WDWN, NAD; alert,appropriate and cooperative throughout exam HEENT: Normocephalic and atraumatic. Throat clear, w/o exudate, no LAD, R TM clear, L TM - good landmarks, No fluid present. rhinnorhea.  Left frontal and maxillary sinuses: NT Right frontal and maxillary sinuses: NT NECK: No ant or post LAD CV: RRR, No M/G/R PULM: no resp distress, no accessory muscles.  No retractions. no w/c/r ABD: S,NT,ND,+BS, No HSM EXTR: no c/c/e PSYCH: full affect, pleasant, conversant    Impression & Recommendations:  Problem # 1:  URI (ICD-465.9)  Given Robitussin samples  I discussed upper respiratory tract infections with the patient and explained viral infections in general.  Recommended plenty of sleep. Symptomatic care with pushing fluids. Symptomatic care with over-the-counter expectorant, such as Mucinex DM or Robitussin-DM, including a cough suppressant. Oral acetaminophen or NSAIDs as tolerated for body aches,  chills, fevers.  follow-up if acutely worsens   Orders: Rapid Strep (78295)  Complete Medication List: 1)  Ramipril 10 Mg Caps (Ramipril) .[name].. 1 by mouth q day 2)  Baby Aspirin 81mg   .... Take one tablet once daily 3)  Multivitamin  .... Take one tablet once daily 4)  Levitra 10 Mg Tabs (Vardenafil hcl) .... One by mouth 30 minutes prior to intercourse 5)  Metformin Hcl 500 Mg  Xr24h-tab (Metformin hcl) .... 3 by mouth daily 6)  Glucometer Elite Classic Kit (Blood glucose monitoring suppl) .... 250.00 7)  Glucometer Elite Test Strp (Glucose blood) .... Check blood sugar twice daily 8)  Glucometer Lancets  .Marland KitchenMarland Kitchen. 250.00 9)  Caduet 5-20 Mg Tabs (Amlodipine-atorvastatin) .[name].. 1 by mouth daily 10)  Zithromax 250 Mg Tabs (Azithromycin) .... Take 2 today and 1 a day for 4 days  Current Allergies (reviewed today): No known allergies   Laboratory Results    Other Tests  Rapid Strep: negative  Kit Test Internal QC: Positive   (Normal Range: Negative)

## 2010-02-16 NOTE — Progress Notes (Signed)
Summary: need written 90 day scripts  Phone Note Call from Patient   Caller: Spouse Summary of Call: Pt has new insurance and has to start using mail order pharmacy.  He needs 90 day written scripts for ramipril, levitra, metformin, caduet and  onglyza.  His wife wants to pick these up on wednesday when she comes in for her appt. Initial call taken by: Lowella Petties CMA,  October 03, 2009 11:38 AM  Follow-up for Phone Call        in inbox for signature Follow-up by: Benny Lennert CMA Duncan Dull),  October 03, 2009 1:05 PM    New/Updated Medications: METFORMIN HCL 500 MG XR24H-TAB (METFORMIN HCL) 4 by mouth daily CADUET 5-20 MG TABS (AMLODIPINE-ATORVASTATIN) 1 by mouth daily ONGLYZA 5 MG TABS (SAXAGLIPTIN HCL) 1 by mouth daily Prescriptions: METFORMIN HCL 500 MG XR24H-TAB (METFORMIN HCL) 4 by mouth daily  #360 x 3   Entered by:   Benny Lennert CMA (AAMA)   Authorized by:   Kerby Nora MD   Signed by:   Benny Lennert CMA (AAMA) on 10/03/2009   Method used:   Print then Give to Patient   RxID:   [id number] LEVITRA 10 MG  TABS (VARDENAFIL HCL) one by mouth 30 minutes prior to intercourse  #20 x 2   Entered by:   Benny Lennert CMA (AAMA)   Authorized by:   Kerby Nora MD   Signed by:   Benny Lennert CMA (AAMA) on 10/03/2009   Method used:   Print then Give to Patient   RxID:   [id number] ONGLYZA 5 MG TABS (SAXAGLIPTIN HCL) 1 by mouth daily  #90 x 3   Entered by:   Benny Lennert CMA (AAMA)   Authorized by:   Kerby Nora MD   Signed by:   Benny Lennert CMA (AAMA) on 10/03/2009   Method used:   Print then Give to Patient   RxID:   [id number] RAMIPRIL 10 MG CAPS (RAMIPRIL) 1 by mouth q day  #90 x 3   Entered by:   Benny Lennert CMA (AAMA)   Authorized by:   Kerby Nora MD   Signed by:   Benny Lennert CMA (AAMA) on 10/03/2009   Method used:   Print then Give to Patient   RxID:   [id number] CADUET 5-20 MG TABS (AMLODIPINE-ATORVASTATIN) 1 by  mouth daily  #90 x 3   Entered by:   Benny Lennert CMA (AAMA)   Authorized by:   Kerby Nora MD   Signed by:   Benny Lennert CMA (AAMA) on 10/03/2009   Method used:   Print then Give to Patient   RxID:   [id number]

## 2010-04-11 ENCOUNTER — Encounter: Payer: Self-pay | Admitting: Family Medicine

## 2010-06-02 NOTE — Consult Note (Signed)
NAME:  Connor Moore, Connor Moore                       ACCOUNT NO.:  [id number]   MEDICAL RECORD NO.:  [id number]                   PATIENT TYPE:  EMS   LOCATION:  [facility]                                 FACILITY:  [facility]   PHYSICIAN:  Artist Pais. Mina Marble, M.D.           DATE OF BIRTH:  1950-03-07   DATE OF CONSULTATION:  DATE OF DISCHARGE:                                   CONSULTATION   REQUESTING PHYSICIAN:  Dr. Lynelle Doctor.   REASON FOR CONSULTATION:  Mr.   Bethann Humble ended at this point.                                               Artist Pais Mina Marble, M.D.    MAW/MEDQ  D:  06/03/2003  T:  06/04/2003  Job:  [id number]

## 2010-06-02 NOTE — Consult Note (Signed)
NAME:  Connor Moore, Connor Moore                       ACCOUNT NO.:  [id number]   MEDICAL RECORD NO.:  [id number]                   PATIENT TYPE:  EMS   LOCATION:  [facility]                                 FACILITY:  [facility]   PHYSICIAN:  Artist Pais. Mina Marble, M.D.           DATE OF BIRTH:  [birth year]   DATE OF CONSULTATION:  DATE OF DISCHARGE:  06/02/2003                                   CONSULTATION   REFERRING PHYSICIAN:  Devoria Albe, M.D.   REASON FOR CONSULTATION:  [name] is a 60 year old right-hand  dominant male who accidentally amputated the tip of his dominant right index  finger while at work today.  He presents today with a nail plate avulsion,  open distal phalangeal fracture and transverse type amputation.  He is 60  years old.   ALLERGIES:  No known drug allergies.   CURRENT MEDICATIONS:  None.   PAST MEDICAL HISTORY:  No recent hospitalizations or surgeries.   FAMILY HISTORY:  Noncontributory.   SOCIAL HISTORY:  Noncontributory.   PHYSICAL EXAMINATION:  Examination reveals a transverse type amputation with  exposed distal phalangeal bone, nail bed laceration, loss of the nail plate  and again exposed distal phalangeal bone.  X-rays show transverse fracture,  distal third of the distal phalanx.   PROCEDURE:  A 60 year old male with an open grade I injury of the distal  phalanx, nail bed laceration, tip amputation.  The patient was given a 2%  plain lidocaine digital sheath block.  Once adequate anesthesia was  obtained, he was prepped and draped in the usual sterile fashion.  A Penrose  drain was used as a tourniquet.  The wound was thoroughly irrigated and  debrided with a liter of normal saline.  The fracture was debrided.  A VY  flap was fashioned and slowly advanced over the tip and sewn to the lateral  edges using 4-0 nylon and 6-0 chromic to the nail bed.  The nail bed itself  was repaired using 6-0 chromic and the VY flap was closed with nylon as  well.  the  patient was then placed in a sterile dressing of Xeroform, 4x4s,  Coban wrapped and compressive dressing. He was discharged from the emergency  department with Keflex 500 mg one p.o. q.i.d. for a week for antibiotic  prophylaxis, Percocet 7.5 mg as needed for pain #30 with no refills.  Follow-  up in my office on Jun 08, 2003.                                               Artist Pais Mina Marble, M.D.    MAW/MEDQ  D:  06/02/2003  T:  06/03/2003  Job:  [id number]   cc:   Devoria Albe, M.D.

## 2010-06-02 NOTE — Consult Note (Signed)
NAME:  Connor Moore, Connor Moore                       ACCOUNT NO.:  [id number]   MEDICAL RECORD NO.:  [id number]                   PATIENT TYPE:  EMS   LOCATION:  [facility]                                 FACILITY:  [facility]   PHYSICIAN:  Charlies Constable, M.D. [facility]              DATE OF BIRTH:  1950-05-01   DATE OF CONSULTATION:  01/07/2002  DATE OF DISCHARGE:                                   CONSULTATION   CARDIOLOGY CONSULTATION:   PRIMARY CARE PHYSICIAN:  Stanley C. Andrey Campanile, M.D.   CHIEF COMPLAINT:  Chest pain.   CLINICAL HISTORY:  The patient is a 60 year old gentleman with no prior  history of heart disease but with cardiac risk factors as described below.  Two days ago, on Monday, while he was sitting in a chair, he developed some  left upper chest discomfort which he describes an intermittent squeezing  sensation which was fairly well localized to the left upper chest and lasted  about 1 minute.  He described this as a pressure feeling.  He had recurrence  yesterday that was quite similar.  His wife was quite concerned and she  arranged for him to see Dr. Andrey Campanile who saw him in the office today and sent  him over to the emergency room for further consultation.  He had no other  associated symptoms with this and specifically had no shortness of breath or  palpitations or diaphoresis or nausea.   PAST MEDICAL HISTORY:  His past medical history is significant for  hypertension and he is a smoker.  He has been told that his cholesterol is  borderline and that his glucose is borderline.   PAST SURGICAL HISTORY:  He has a history of knee surgery at age 57.   CURRENT MEDICATIONS:  His current medications are Altace 10 mg daily and  ibuprofen.   SOCIAL HISTORY:  He lives in [place] with his wife and they have two  children.  He works as a Surveyor, minerals.  His wife's father, Mitchel Honour, had  been a patient of mine.   FAMILY HISTORY:  His family history is positive for diabetes in his father  and diabetes and hypertension in a sister and brother and hypertension in  one other brother but no known heart disease.   SOCIAL HISTORY, FAMILY HISTORY AND REVIEW OF SYSTEMS:  For details of social  history, family history and review of systems please see Theodoro Grist Rinehuls  complete note.   PHYSICAL EXAMINATION:  VITAL SIGNS:  Blood pressure 156/96, pulse 56 and  regular.  NECK:  There was no venous distention.  The carotid pulses were 4 without  bruits.  CHEST:  Clear without rales or rhonchi.  HEART:  Cardiac rhythm was regular.  The first and second heart sounds were  normal.  There were no murmurs, rubs, or gallops.  ABDOMEN:  Soft without hepatosplenomegaly or pulsatile masses.  Bowel sounds  were normal.  EXTREMITIES:  No edema and good pedal pulses.  MUSCULOSKELETAL:  No deformities.  SKIN:  Warm and dry.  NEUROLOGICAL:  No focal neurological signs.   ELECTROCARDIOGRAM:  Normal.   IMPRESSION:  1. Atypical chest pain.  2. Hypertension.  3. Borderline diabetes.  4. Borderline glucose intolerance.  5. Tobacco use.   RECOMMENDATIONS:  The patient certainly has positive risk factors for  coronary heart disease and is at risk for coronary heart disease.  His  symptoms are atypical for ischemia and his EKG is normal.  I think he can be  evaluated as an outpatient if his initial enzymes and other laboratory  studies are negative.  We had him scheduled for a Cardiolite scan on Friday  and if his troponin and CK-MB's are negative we will discharge him today and  have him return for that scan on Friday with a followup visit after that.  He will be made attention to primary risk factor modification if he has no  evidence of ischemic heart disease.                                               Charlies Constable, M.D. [facility]    BB/MEDQ  D:  01/07/2002  T:  01/08/2002  Job:  [id number]   cc:   [place]ale [place]. Andrey Campanile, M.D.  [address]  [place]  [place] [address]  Fax: [phone]

## 2010-06-16 ENCOUNTER — Ambulatory Visit (INDEPENDENT_AMBULATORY_CARE_PROVIDER_SITE_OTHER): Payer: 59 | Admitting: Family Medicine

## 2010-06-16 ENCOUNTER — Ambulatory Visit: Payer: Self-pay | Admitting: Family Medicine

## 2010-06-16 ENCOUNTER — Telehealth: Payer: Self-pay | Admitting: *Deleted

## 2010-06-16 ENCOUNTER — Encounter: Payer: Self-pay | Admitting: Family Medicine

## 2010-06-16 VITALS — BP 128/82 | HR 82 | Temp 98.2°F | Wt 219.0 lb

## 2010-06-16 DIAGNOSIS — R600 Localized edema: Secondary | ICD-10-CM

## 2010-06-16 DIAGNOSIS — R609 Edema, unspecified: Secondary | ICD-10-CM

## 2010-06-16 NOTE — Telephone Encounter (Signed)
Called report from [facility]- negative for DVT but pt does have one large popliteal fossa cyst behind knee, to low to mid calf.  Per Dr. Para March, advised pt to go home and rest, take it easy over the weekend, elevate leg and call back on Monday with report on how he is doing.

## 2010-06-16 NOTE — Assessment & Plan Note (Addendum)
Potentially a DVT.  Refer for u/s today.  It is possible this is just a calf strain, but with the swelling I would eval for DVT.  D/w pt and he understood.  Not sob an okay for outpatient doppler.  >25 min spent with face to face with patient for counseling and coordinating care (>50%).

## 2010-06-16 NOTE — Progress Notes (Signed)
Golf trip 3 weeks ago, played a lot.  On concrete at work 12h per day otherwise.  R knee- prev replacement- prev swollen and now R calf swollen for ~8 days.  No knee pain.  No injury, no pop or snap heard in the knee.  Pain in calf with plantar flexion but not motor deficit.  Feeling well o/w.   Meds, vitals, and allergies reviewed.   ROS: See HPI.  Otherwise, noncontributory.  nad R knee with scar noted, not ttp and not puffy R calf diffusely puffy with 1-2+ pitting edema.  R calf if 43cm, L calf is 39.5cm No erythema Pain with plantar flexion, no pain with resisted dorsiflexion of the foot.  Normal DP pulse

## 2010-06-20 ENCOUNTER — Encounter: Payer: Self-pay | Admitting: Family Medicine

## 2010-11-07 ENCOUNTER — Other Ambulatory Visit: Payer: Self-pay | Admitting: Family Medicine

## 2010-11-07 NOTE — Telephone Encounter (Signed)
Patient not seen in over a year for cpx

## 2010-11-07 NOTE — Telephone Encounter (Signed)
Patient advised to schedule appt and rx sent to pharmacy

## 2010-11-07 NOTE — Telephone Encounter (Signed)
Can you refill  3 month supply of each and schedule for CPX?

## 2010-12-06 ENCOUNTER — Encounter: Payer: Self-pay | Admitting: Family Medicine

## 2010-12-06 ENCOUNTER — Ambulatory Visit (INDEPENDENT_AMBULATORY_CARE_PROVIDER_SITE_OTHER): Payer: 59 | Admitting: Family Medicine

## 2010-12-06 VITALS — BP 130/90 | HR 72 | Temp 97.9°F | Ht 71.0 in | Wt 218.1 lb

## 2010-12-06 DIAGNOSIS — Z125 Encounter for screening for malignant neoplasm of prostate: Secondary | ICD-10-CM

## 2010-12-06 DIAGNOSIS — F17200 Nicotine dependence, unspecified, uncomplicated: Secondary | ICD-10-CM

## 2010-12-06 DIAGNOSIS — E1140 Type 2 diabetes mellitus with diabetic neuropathy, unspecified: Secondary | ICD-10-CM

## 2010-12-06 DIAGNOSIS — E119 Type 2 diabetes mellitus without complications: Secondary | ICD-10-CM

## 2010-12-06 DIAGNOSIS — I10 Essential (primary) hypertension: Secondary | ICD-10-CM

## 2010-12-06 DIAGNOSIS — E785 Hyperlipidemia, unspecified: Secondary | ICD-10-CM

## 2010-12-06 DIAGNOSIS — E1149 Type 2 diabetes mellitus with other diabetic neurological complication: Secondary | ICD-10-CM

## 2010-12-06 DIAGNOSIS — E1142 Type 2 diabetes mellitus with diabetic polyneuropathy: Secondary | ICD-10-CM

## 2010-12-06 DIAGNOSIS — Z0000 Encounter for general adult medical examination without abnormal findings: Secondary | ICD-10-CM

## 2010-12-06 LAB — CBC WITH DIFFERENTIAL/PLATELET
Basophils Relative: 0.5 % (ref 0.0–3.0)
Eosinophils Absolute: 0.2 10*3/uL (ref 0.0–0.7)
Eosinophils Relative: 1.7 % (ref 0.0–5.0)
HCT: 47.3 % (ref 39.0–52.0)
Hemoglobin: 15.9 g/dL (ref 13.0–17.0)
MCHC: 33.6 g/dL (ref 30.0–36.0)
MCV: 97.8 fl (ref 78.0–100.0)
Monocytes Absolute: 0.7 10*3/uL (ref 0.1–1.0)
Neutro Abs: 6.1 10*3/uL (ref 1.4–7.7)
RBC: 4.84 Mil/uL (ref 4.22–5.81)
WBC: 9.3 10*3/uL (ref 4.5–10.5)

## 2010-12-06 LAB — MICROALBUMIN / CREATININE URINE RATIO
Microalb Creat Ratio: 0.5 mg/g (ref 0.0–30.0)
Microalb, Ur: 0.6 mg/dL (ref 0.0–1.9)

## 2010-12-06 LAB — BASIC METABOLIC PANEL
CO2: 26 mEq/L (ref 19–32)
Chloride: 103 mEq/L (ref 96–112)
Creatinine, Ser: 0.9 mg/dL (ref 0.4–1.5)
Potassium: 5.4 mEq/L — ABNORMAL HIGH (ref 3.5–5.1)
Sodium: 136 mEq/L (ref 135–145)

## 2010-12-06 LAB — LIPID PANEL
HDL: 37.7 mg/dL — ABNORMAL LOW (ref >39.00)
Total CHOL/HDL Ratio: 4
Triglycerides: 149 mg/dL (ref 0.0–149.0)

## 2010-12-06 LAB — HEPATIC FUNCTION PANEL
ALT: 26 U/L (ref 0–53)
Albumin: 4.7 g/dL (ref 3.5–5.2)
Total Protein: 7.5 g/dL (ref 6.0–8.3)

## 2010-12-06 LAB — PSA: PSA: 0.56 ng/mL (ref 0.10–4.00)

## 2010-12-06 NOTE — Progress Notes (Signed)
Patient Name: Connor Moore Date of Birth: November 11, 1950 Age: 60 y.o. Medical Record Number: [id number] Gender: male  History of Present Illness:  Connor Moore is a 59 y.o. very pleasant male patient who presents with the following:  Preventative Health Maintenance Visit:  Health Maintenance Summary Reviewed and updated, unless pt declines services.  Tobacco History Reviewed. Alcohol: No concerns, no excessive use Exercise Habits: Some activity, rec at least 30 mins 5 times a week - some labor with work but no routine exercise STD concerns: no risk or activity to increase risk Drug Use: None Encouraged self-testicular check  Health Maintenance  Topic Date Due  . Foot Exam  05/20/2010  . Hemoglobin A1c  06/05/2011  . Influenza Vaccine  10/16/2011  . Urine Microalbumin  12/06/2011  . Ophthalmology Exam  12/07/2011  . Pneumococcal Polysaccharide Vaccine (#2) 12/07/2015  . Colonoscopy  03/23/2018  . Tetanus/tdap  12/06/2020    Every 2 weeks going from days to nights.  4 on, 3 off.  Checks every so often with his BS -- not in 3 months. Sometimes  Will get a tingling sensation in feet.   Diabetes Mellitus: Tolerating Medications: yes Compliance with diet: fair Exercise: minimal aside from work Avg blood sugars at home: rarely checks -- last 3 months ago, abuout 130 Foot problems: some new tingling Hypoglycemia: none No nausea, vomitting, blurred vision, polyuria.  Lab Results  Component Value Date   HGBA1C 7.1* 12/06/2010    Wt Readings from Last 3 Encounters:  12/06/10 218 lb 1.9 oz (98.939 kg)  06/16/10 219 lb 0.6 oz (99.356 kg)  11/17/09 215 lb (97.523 kg)    Body mass index is 30.42 kg/(m^2).  HTN: Tolerating all medications without side effects Stable and at goal No CP, no sob. No HA.  BP Readings from Last 3 Encounters:  12/06/10 130/90  06/16/10 128/82  11/17/09 120/90    Basic Metabolic Panel:    Component Value Date/Time   NA 136 12/06/2010  0910   K 5.4* 12/06/2010 0910   CL 103 12/06/2010 0910   CO2 26 12/06/2010 0910   BUN 26* 12/06/2010 0910   CREATININE 0.9 12/06/2010 0910   GLUCOSE 158* 12/06/2010 0910   CALCIUM 9.7 12/06/2010 0910    Lipids: Doing well, stable. Tolerating meds fine with no SE. Panel reviewed with patient.  Lipids:    Component Value Date/Time   CHOL 143 12/06/2010 0910   TRIG 149.0 12/06/2010 0910   HDL 37.70* 12/06/2010 0910   VLDL 29.8 12/06/2010 0910   CHOLHDL 4 12/06/2010 0910    Lab Results  Component Value Date   ALT 26 12/06/2010   AST 22 12/06/2010   ALKPHOS 71 12/06/2010   BILITOT 1.0 12/06/2010    Does not want flu or pneumonia shot.   Smoking, less than a pack a day.   Past Medical History, Surgical History, Social History, Family History, and Problem List have been reviewed in EHR and updated if relevant.  Review of Systems:  General: Denies fever, chills, sweats. No significant weight loss. Eyes: Denies blurring,significant itching ENT: Denies earache, sore throat, and hoarseness. Cardiovascular: Denies chest pains, palpitations, dyspnea on exertion Respiratory: Denies cough, dyspnea at rest,wheeezing Breast: no concerns about lumps GI: Denies nausea, vomiting, diarrhea, constipation, change in bowel habits, abdominal pain, melena, hematochezia GU: Denies penile discharge, ED, urinary flow / outflow problems. No STD concerns. Musculoskeletal: Denies back pain, joint pain Derm: Denies rash, itching Neuro: above Psych: Denies depression, anxiety Endocrine: Denies  cold intolerance, heat intolerance, polydipsia Heme: Denies enlarged lymph nodes Allergy: No hayfever   Physical Examination: Filed Vitals:   12/06/10 0823  BP: 130/90  Pulse: 72  Temp: 97.9 F (36.6 C)  TempSrc: Oral  Height: 5\' 11"  (1.803 m)  Weight: 218 lb 1.9 oz (98.939 kg)  SpO2: 99%   Wt Readings from Last 3 Encounters:  12/06/10 218 lb 1.9 oz (98.939 kg)  06/16/10 219 lb 0.6 oz  (99.356 kg)  11/17/09 215 lb (97.523 kg)    GEN: well developed, well nourished, no acute distress Eyes: conjunctiva and lids normal, PERRLA, EOMI ENT: TM clear, nares clear, oral exam WNL Neck: supple, no lymphadenopathy, no thyromegaly, no JVD Pulm: clear to auscultation and percussion, respiratory effort normal CV: regular rate and rhythm, S1-S2, no murmur, rub or gallop, no bruits, peripheral pulses normal and symmetric, no cyanosis, clubbing, edema or varicosities Chest: no scars, masses GI: soft, non-tender; no hepatosplenomegaly, masses; active bowel sounds all quadrants GU: no hernia, testicular mass, penile discharge, or prostate enlargement Lymph: no cervical, axillary or inguinal adenopathy MSK: gait normal, muscle tone and strength WNL, no joint swelling, effusions, discoloration, crepitus  SKIN: clear, good turgor, color WNL, no rashes, lesions, or ulcerations Neuro: normal mental status, normal strength, sensation, and motion Psych: alert; oriented to person, place and time, normally interactive and not anxious or depressed in appearance.   Assessment and Plan: 1. Routine general medical examination at a health care facility : The patient's preventative maintenance and recommended screening tests for an annual wellness exam were reviewed in full today. Brought up to date unless services declined.  Counselled on the importance of diet, exercise, and its role in overall health and mortality. The patient's FH and SH was reviewed, including their home life, tobacco status, and drug and alcohol status.   Declines all vaccines CBC with Differential  2. Special screening for malignant neoplasm of prostate  PSA  3. AODM : stable, check labs, adjust meds if needed Basic metabolic panel, Microalbumin / creatinine urine ratio, Hemoglobin A1c  4. HYPERLIPIDEMIA : cont with atorva Hepatic function panel, Lipid panel  5. HYPERTENSION : cont current meds   6. TOBACCO USE : advised d/c    7. Diabetic neuropathy

## 2010-12-07 ENCOUNTER — Encounter: Payer: Self-pay | Admitting: Family Medicine

## 2010-12-07 DIAGNOSIS — E1140 Type 2 diabetes mellitus with diabetic neuropathy, unspecified: Secondary | ICD-10-CM

## 2010-12-07 DIAGNOSIS — E1142 Type 2 diabetes mellitus with diabetic polyneuropathy: Secondary | ICD-10-CM | POA: Insufficient documentation

## 2010-12-07 HISTORY — DX: Type 2 diabetes mellitus with diabetic neuropathy, unspecified: E11.40

## 2010-12-11 ENCOUNTER — Encounter: Payer: Self-pay | Admitting: *Deleted

## 2011-01-31 ENCOUNTER — Other Ambulatory Visit: Payer: Self-pay | Admitting: Family Medicine

## 2011-02-22 ENCOUNTER — Other Ambulatory Visit: Payer: Self-pay | Admitting: Family Medicine

## 2011-03-15 ENCOUNTER — Other Ambulatory Visit: Payer: Self-pay | Admitting: Family Medicine

## 2011-03-23 ENCOUNTER — Other Ambulatory Visit: Payer: Self-pay | Admitting: *Deleted

## 2011-03-23 MED ORDER — METFORMIN HCL ER 500 MG PO TB24: ORAL_TABLET | ORAL | Status: DC

## 2011-03-23 MED ORDER — RAMIPRIL 10 MG PO CAPS: 10.0000 mg | ORAL_CAPSULE | Freq: Every day | ORAL | Status: DC

## 2011-03-23 NOTE — Telephone Encounter (Signed)
Rx's were called to [facility] [phone].

## 2011-04-10 ENCOUNTER — Other Ambulatory Visit: Payer: Self-pay

## 2011-04-10 MED ORDER — AMLODIPINE-ATORVASTATIN 5-20 MG PO TABS: 1.0000 | ORAL_TABLET | Freq: Every day | ORAL | Status: DC

## 2011-04-10 NOTE — Telephone Encounter (Signed)
pts wife said Caduet that was sent electronically on 02/22/11 was not received by [facility]. Resend to [facility] Caduet 5-20 mg #90 x0.

## 2011-07-12 ENCOUNTER — Other Ambulatory Visit: Payer: Self-pay | Admitting: Family Medicine

## 2011-07-30 ENCOUNTER — Encounter: Payer: Self-pay | Admitting: Family Medicine

## 2011-07-30 ENCOUNTER — Ambulatory Visit (INDEPENDENT_AMBULATORY_CARE_PROVIDER_SITE_OTHER): Payer: BC Managed Care – PPO | Admitting: Family Medicine

## 2011-07-30 VITALS — BP 124/78 | HR 88 | Temp 98.8°F | Wt 220.8 lb

## 2011-07-30 DIAGNOSIS — J40 Bronchitis, not specified as acute or chronic: Secondary | ICD-10-CM

## 2011-07-30 MED ORDER — DOXYCYCLINE HYCLATE 100 MG PO TABS: 100.0000 mg | ORAL_TABLET | Freq: Two times a day (BID) | ORAL | Status: AC

## 2011-07-30 MED ORDER — ALBUTEROL SULFATE HFA 108 (90 BASE) MCG/ACT IN AERS: 2.0000 | INHALATION_SPRAY | Freq: Four times a day (QID) | RESPIRATORY_TRACT | Status: DC | PRN

## 2011-07-30 NOTE — Patient Instructions (Addendum)
Start the antibiotics and use the inhaler.  This should gradually improve.  Take care.

## 2011-07-30 NOTE — Progress Notes (Signed)
duration of symptoms: since 07/19/11 Cough with sputum, ache, fevers, pressure on chest, deep breath triggers a cough.  No inhaler use.  Stopped smoking after 40 years.   Fatigued.  Some wheeze at night, more recently.  Sugar has been controlled.   ROS: See HPI.  Otherwise negative.    Meds, vitals, and allergies reviewed.   GEN: nad, alert and oriented HEENT: mucous membranes moist, TM w/o erythema, nasal epithelium mildly injected, OP with cobblestoning NECK: supple w/o LA CV: rrr. PULM: diffuse ronchi and wheeze, no inc wob, cough noted, no UAN ABD: soft, +bs EXT: no edema

## 2011-07-31 DIAGNOSIS — J40 Bronchitis, not specified as acute or chronic: Secondary | ICD-10-CM | POA: Insufficient documentation

## 2011-07-31 NOTE — Assessment & Plan Note (Signed)
Nontoxic, avoid steroids due to DM2, use doxy and start SABA with routine cautions and instructions for both.  F/u prn. Nontoxic. He agrees.

## 2011-10-08 ENCOUNTER — Other Ambulatory Visit: Payer: Self-pay | Admitting: *Deleted

## 2011-10-08 MED ORDER — RAMIPRIL 10 MG PO CAPS: 10.0000 mg | ORAL_CAPSULE | Freq: Every day | ORAL | Status: DC

## 2011-10-08 MED ORDER — AMLODIPINE-ATORVASTATIN 5-20 MG PO TABS: ORAL_TABLET | ORAL | Status: DC

## 2011-10-08 MED ORDER — METFORMIN HCL ER 500 MG PO TB24: ORAL_TABLET | ORAL | Status: DC

## 2011-10-08 MED ORDER — SAXAGLIPTIN HCL 5 MG PO TABS: ORAL_TABLET | ORAL | Status: DC

## 2011-10-08 NOTE — Telephone Encounter (Signed)
Patient needs office visit before further refills, wife advised.  Scripts given to patients wife for mail in.

## 2011-10-22 ENCOUNTER — Encounter: Payer: Self-pay | Admitting: Family Medicine

## 2011-10-22 ENCOUNTER — Ambulatory Visit (INDEPENDENT_AMBULATORY_CARE_PROVIDER_SITE_OTHER): Payer: BC Managed Care – PPO | Admitting: Family Medicine

## 2011-10-22 VITALS — BP 140/80 | HR 64 | Temp 98.5°F | Ht 71.0 in | Wt 227.5 lb

## 2011-10-22 DIAGNOSIS — E785 Hyperlipidemia, unspecified: Secondary | ICD-10-CM

## 2011-10-22 DIAGNOSIS — I889 Nonspecific lymphadenitis, unspecified: Secondary | ICD-10-CM

## 2011-10-22 DIAGNOSIS — E119 Type 2 diabetes mellitus without complications: Secondary | ICD-10-CM

## 2011-10-22 DIAGNOSIS — F17200 Nicotine dependence, unspecified, uncomplicated: Secondary | ICD-10-CM

## 2011-10-22 LAB — CBC WITH DIFFERENTIAL/PLATELET
Basophils Relative: 0.3 % (ref 0.0–3.0)
Eosinophils Absolute: 0.1 10*3/uL (ref 0.0–0.7)
Eosinophils Relative: 1.7 % (ref 0.0–5.0)
Hemoglobin: 15.6 g/dL (ref 13.0–17.0)
Lymphocytes Relative: 31.4 % (ref 12.0–46.0)
MCHC: 32.9 g/dL (ref 30.0–36.0)
Monocytes Relative: 9 % (ref 3.0–12.0)
Neutrophils Relative %: 57.6 % (ref 43.0–77.0)
RBC: 4.87 Mil/uL (ref 4.22–5.81)
WBC: 7.6 10*3/uL (ref 4.5–10.5)

## 2011-10-22 MED ORDER — AMOXICILLIN-POT CLAVULANATE 875-125 MG PO TABS: 1.0000 | ORAL_TABLET | Freq: Two times a day (BID) | ORAL | Status: DC

## 2011-10-22 NOTE — Progress Notes (Signed)
Nature conservation officer at [facility] [address] [place] [address] Phone: [phone] Fax: [phone]  Date:  10/22/2011   Name:  Connor Moore   DOB:  [birth year]   MRN:  [id number] Gender: male Age: 61 y.o.  PCP:  Hannah Beat, MD    Chief Complaint: tender spot on throat   History of Present Illness:  Connor Moore is a 61 y.o. pleasant patient who presents with the following:  LAD enlarged: enlarged L anterior LAD, somewhat tender to palpation. Some ST with drainage. H/o 40 pack years smoking  Diabetes Mellitus: Tolerating Medications: yes Compliance with diet: fair Avg blood sugars at home: not checking Foot problems: none Hypoglycemia: none No nausea, vomitting, blurred vision, polyuria.  Lab Results  Component Value Date   HGBA1C 7.1* 12/06/2010    Wt Readings from Last 3 Encounters:  10/22/11 227 lb 8 oz (103.193 kg)  07/30/11 220 lb 12 oz (100.132 kg)  12/06/10 218 lb 1.9 oz (98.939 kg)    Body mass index is 31.73 kg/(m^2).   Lipids: Doing well, stable. Tolerating meds fine with no SE. Panel reviewed with patient.  Lipids:    Component Value Date/Time   CHOL 143 12/06/2010 0910   TRIG 149.0 12/06/2010 0910   HDL 37.70* 12/06/2010 0910   VLDL 29.8 12/06/2010 0910   CHOLHDL 4 12/06/2010 0910    Lab Results  Component Value Date   ALT 26 12/06/2010   AST 22 12/06/2010   ALKPHOS 71 12/06/2010   BILITOT 1.0 12/06/2010   Check a1c labs  Patient Active Problem List  Diagnosis  . AODM  . HYPERLIPIDEMIA  . ERECTILE DYSFUNCTION  . TOBACCO USE  . HYPERTENSION  . Diabetic neuropathy  . Bronchitis    Past Medical History  Diagnosis Date  . Diabetes mellitus type II   . HLD (hyperlipidemia)   . HTN (hypertension)   . ED (erectile dysfunction)     rare  . Obesity   . Diverticulosis   . Colon polyps   . Diabetic neuropathy 12/07/2010    Past Surgical History  Procedure Date  . Appendectomy   . Inguinal hernia  repair 1974  . Tonsillectomy   . Left hydrocele surgery 1974  . Testicle removal 1974    Left  . Replacement unicondylar joint knee 2009  . Vasectomy 1981  . Knee arthroscopy 1986    Right    History  Substance Use Topics  . Smoking status: Former Smoker -- 30 years  . Smokeless tobacco: Never Used   Comment: ? quit 2011  . Alcohol Use: Yes     Socially    Family History  Problem Relation Age of Onset  . Diabetes Father   . Diabetes Sister   . Mental illness Mother     ?   [name] Cancer Father     mouth    No Known Allergies  Medication list has been reviewed and updated.  Outpatient Prescriptions Prior to Visit  Medication Sig Dispense Refill  . albuterol (PROVENTIL HFA;VENTOLIN HFA) 108 (90 BASE) MCG/ACT inhaler Inhale 2 puffs into the lungs every 6 (six) hours as needed for wheezing.  1 Inhaler  0  . amLODipine-atorvastatin (CADUET) 5-20 MG per tablet Take one tablet by mouth daily  90 tablet  0  . aspirin 81 MG tablet Take 81 mg by mouth daily.        [name] glucose blood (GLUCOMETER ELITE TEST STRIPS) test strip 1 each by Other  route 2 (two) times daily. Use as instructed       . Lancets 28G MISC by Does not apply route as directed.        . metFORMIN (GLUCOPHAGE-XR) 500 MG 24 hr tablet Take four tablets by mouth daily.  360 tablet  0  . Multiple Vitamin (MULTIVITAMIN) tablet Take 1 tablet by mouth daily.      . ramipril (ALTACE) 10 MG capsule Take 1 capsule (10 mg total) by mouth daily.  90 capsule  0  . saxagliptin HCl (ONGLYZA) 5 MG TABS tablet Take one tablet by mouth daily  90 tablet  0  . vardenafil (LEVITRA) 10 MG tablet Take 10 mg by mouth daily as needed. 30 minutes prior to intercourse         Review of Systems:   GEN: No acute illnesses, no fevers, chills. GI: No n/v/d, eating normally Pulm: No SOB Interactive and getting along well at home.  Otherwise, ROS is as per the HPI.   Physical Examination: Filed Vitals:   10/22/11 0824  BP: 140/80  Pulse:  64  Temp: 98.5 F (36.9 C)   Filed Vitals:   10/22/11 0824  Height: 5\' 11"  (1.803 m)  Weight: 227 lb 8 oz (103.193 kg)   Body mass index is 31.73 kg/(m^2). Ideal Body Weight: Weight in (lb) to have BMI = 25: 178.9    GEN: WDWN, NAD, Non-toxic, A & O x 3 HEENT: Atraumatic, Normocephalic. Neck supple. No masses, No LAD. Ears and Nose: No external deformity. CV: RRR, No M/G/R. No JVD. No thrill. No extra heart sounds. PULM: CTA B, no wheezes, crackles, rhonchi. No retractions. No resp. distress. No accessory muscle use. EXTR: No c/c/e NEURO Normal gait.  PSYCH: Normally interactive. Conversant. Not depressed or anxious appearing.  Calm demeanor.    Assessment and Plan:  1. Lymphadenitis  CBC with Differential  2. AODM  Hemoglobin A1c  3. HYPERLIPIDEMIA    4. TOBACCO USE     Augmentin, CBC -- follow, if not improved in a month will get ENT opinion  DM, check labs  D/c tob and still off  Orders Today:  Orders Placed This Encounter  Procedures  . CBC with Differential  . Hemoglobin A1c    Updated Medication List: (Includes new medications, updates to list, dose adjustments) Meds ordered this encounter  Medications  . amoxicillin-clavulanate (AUGMENTIN) 875-125 MG per tablet    Sig: Take 1 tablet by mouth 2 (two) times daily.    Dispense:  20 tablet    Refill:  0    Medications Discontinued: There are no discontinued medications.   Hannah Beat, MD

## 2011-10-23 ENCOUNTER — Encounter: Payer: Self-pay | Admitting: *Deleted

## 2011-11-20 ENCOUNTER — Encounter: Payer: Self-pay | Admitting: Family Medicine

## 2011-11-20 ENCOUNTER — Ambulatory Visit (INDEPENDENT_AMBULATORY_CARE_PROVIDER_SITE_OTHER): Payer: BC Managed Care – PPO | Admitting: Family Medicine

## 2011-11-20 VITALS — BP 128/80 | HR 96 | Temp 99.3°F | Wt 229.0 lb

## 2011-11-20 DIAGNOSIS — J189 Pneumonia, unspecified organism: Secondary | ICD-10-CM

## 2011-11-20 MED ORDER — LEVOFLOXACIN 500 MG PO TABS: 500.0000 mg | ORAL_TABLET | Freq: Every day | ORAL | Status: DC

## 2011-11-20 MED ORDER — ALBUTEROL SULFATE HFA 108 (90 BASE) MCG/ACT IN AERS: 2.0000 | INHALATION_SPRAY | Freq: Four times a day (QID) | RESPIRATORY_TRACT | Status: DC | PRN

## 2011-11-20 NOTE — Progress Notes (Signed)
Started with sx 2 weeks ago.  Cough and sputum (clear).  Fever started yesterday.  Sweaty.  Feels worse in the last 2 days.  Dec in appetite.  Can get a deep breath but it makes him cough.  More wheeze than normal.  No vomiting, diarrhea.  Hasn't used the inhaler recently.    Meds, vitals, and allergies reviewed.   ROS: See HPI.  Otherwise, noncontributory.  NARD but looks fatigued, nontoxic.  Tm wnl ncat Mmm Neck supple, no LA rrr Diffuse exp wheeze B in all fields with rhonchi concentrated in RLL No inc in wob abd soft Ext well perfused.

## 2011-11-20 NOTE — Patient Instructions (Addendum)
Call back with update in 2 days.  If more short of breath, go to the ER.  Start the antibiotics today and use the inhaler if needed.  Take care.

## 2011-11-21 DIAGNOSIS — J189 Pneumonia, unspecified organism: Secondary | ICD-10-CM | POA: Insufficient documentation

## 2011-11-21 NOTE — Assessment & Plan Note (Addendum)
Presumed, start levaquin and SABA.  Did not image as it wouldn't change mgmt. D/w pt. If profoundly sob, then to ER.  Okay for outpatient f/u.  He'll call back with update.  He agrees. Inhaler instructions given.

## 2011-12-07 ENCOUNTER — Other Ambulatory Visit: Payer: Self-pay

## 2011-12-07 MED ORDER — SAXAGLIPTIN HCL 5 MG PO TABS: ORAL_TABLET | ORAL | Status: DC

## 2011-12-07 MED ORDER — AMLODIPINE-ATORVASTATIN 5-20 MG PO TABS: ORAL_TABLET | ORAL | Status: DC

## 2011-12-07 NOTE — Telephone Encounter (Signed)
Pt leaving for [place] this afternoon; waiting on mail order med to arrive; request 2 weeks med for onglyza and amlodipine-hctz [facility]. Advised pt done.

## 2012-02-26 IMAGING — US US EXTREM LOW VENOUS*R*
1 series · 17 of 24 positions shown · non-contrast
Comparison: none

REASON FOR EXAM: rt leg edema swelling pain eval DVT
COMMENTS:

PROCEDURE:     US  - US DOPPLER LOW EXTR RIGHT  - June 16, 2010 [DATE]
RESULT:     Comparison: None

[Series 1: us extrem low venous*right* · 17 of 45 slices shown]
[im 1/45]
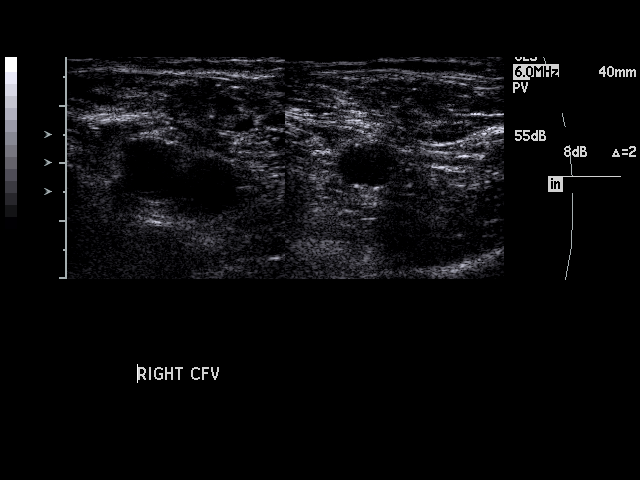
[im 4/45]
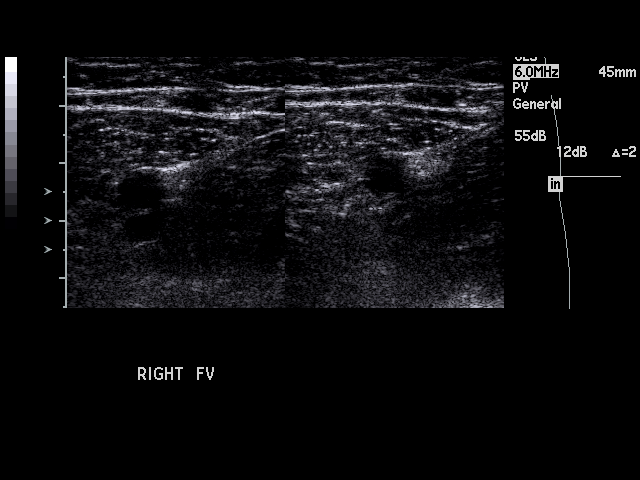
[im 6/45]
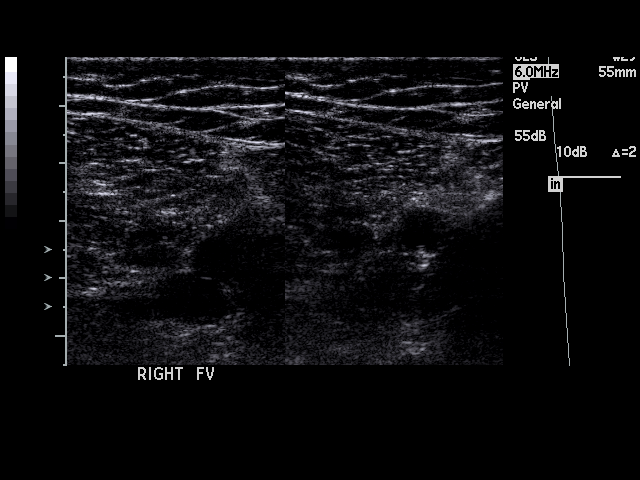
[im 8/45]
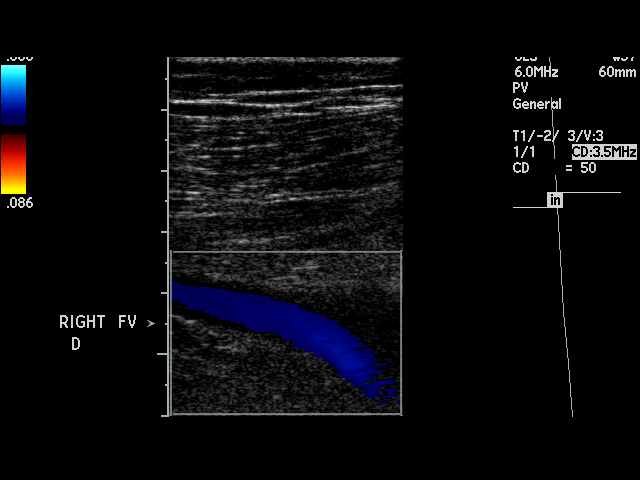
[im 12/45]
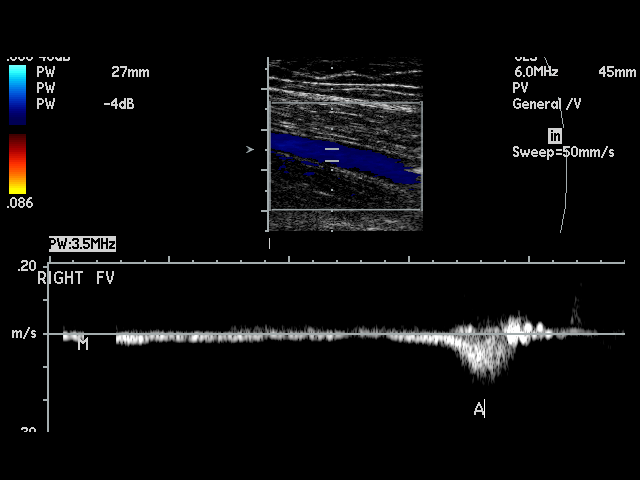
[im 14/45]
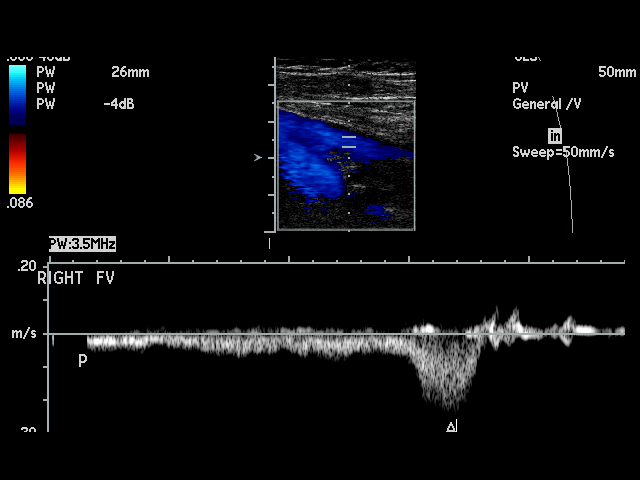
[im 18/45]
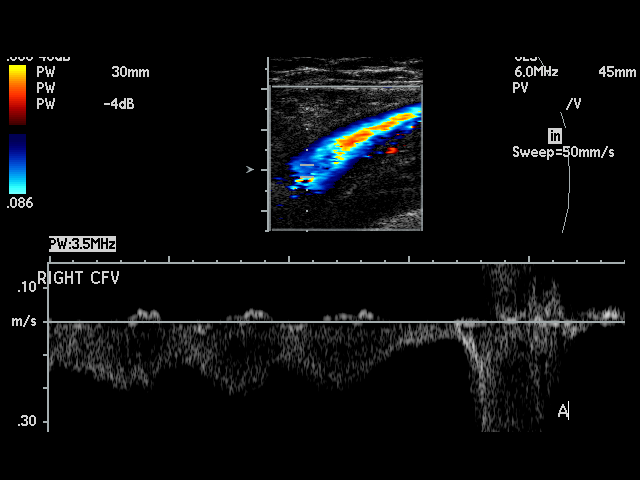
[im 20/45]
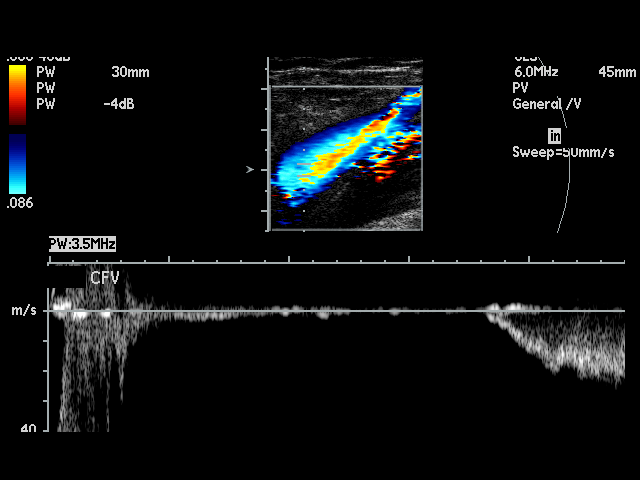
[im 23/45]
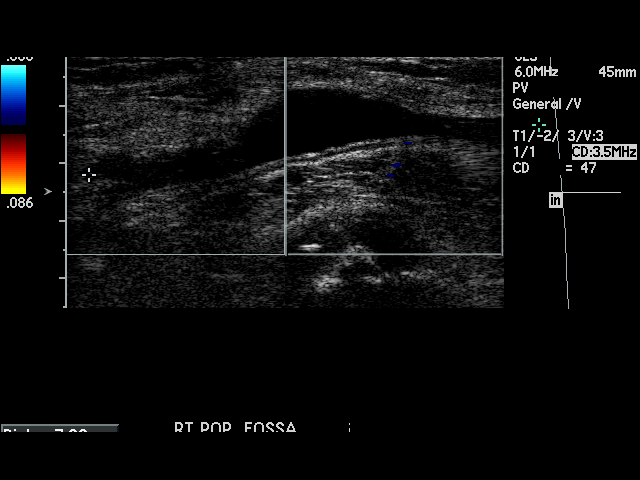
[im 25/45]
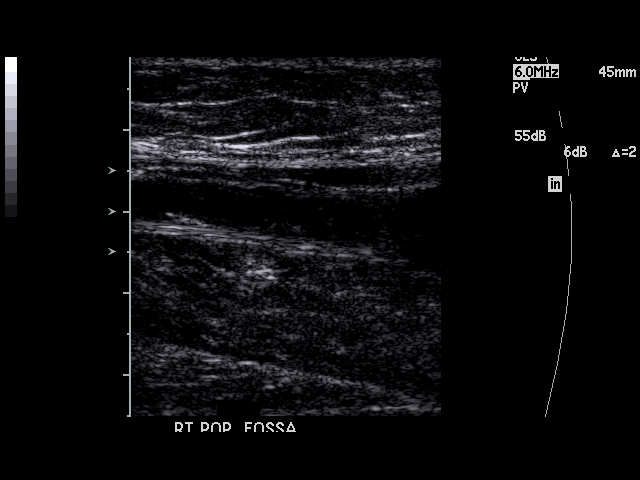
[im 27/45]
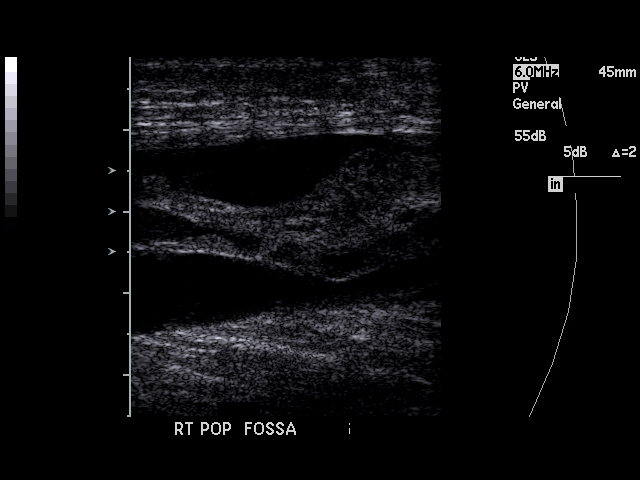
[im 31/45]
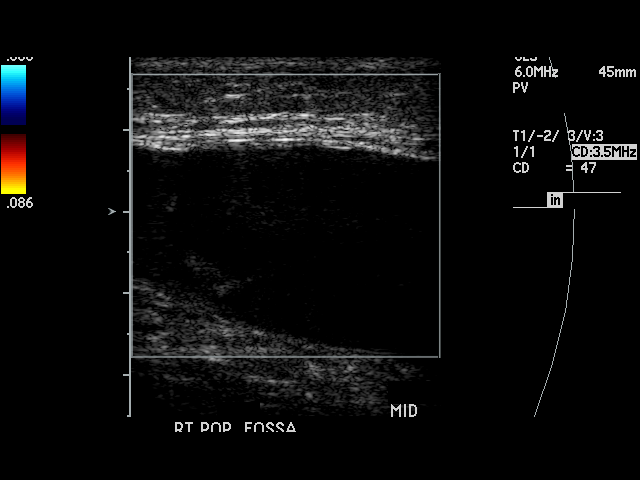
[im 33/45]
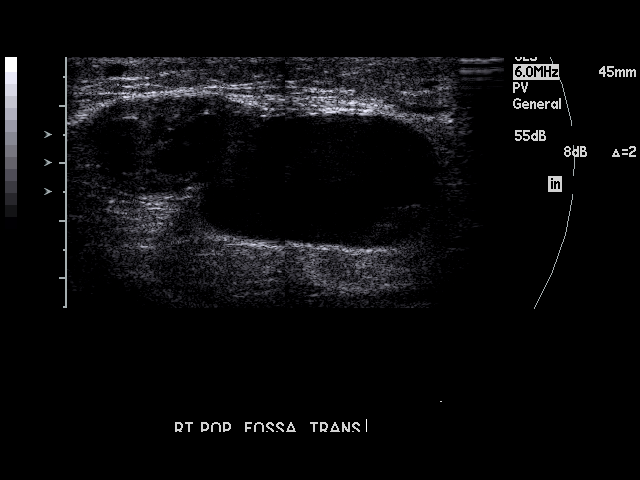
[im 37/45]
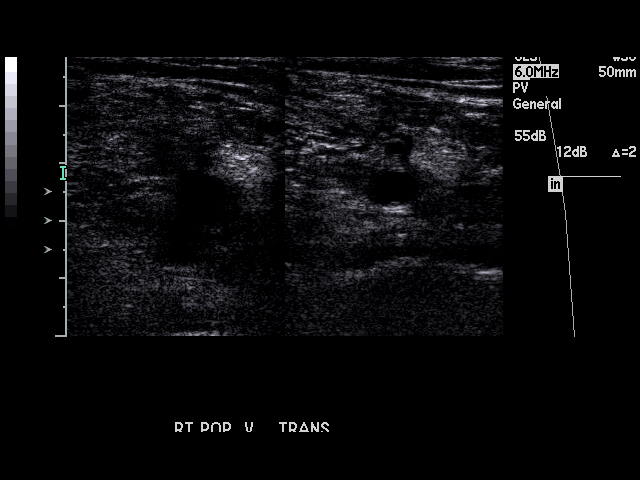
[im 39/45]
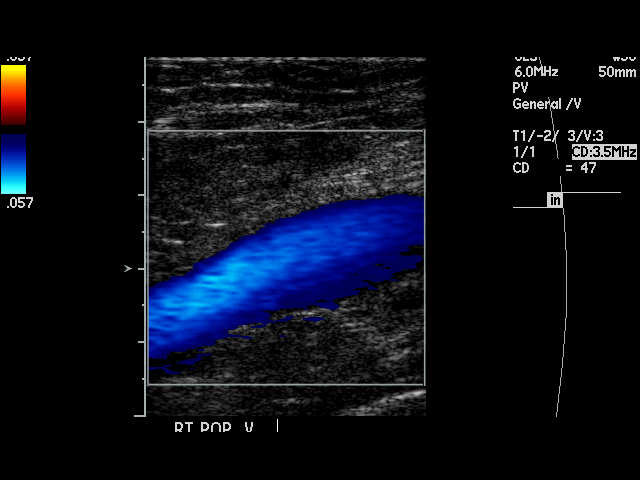
[im 41/45]
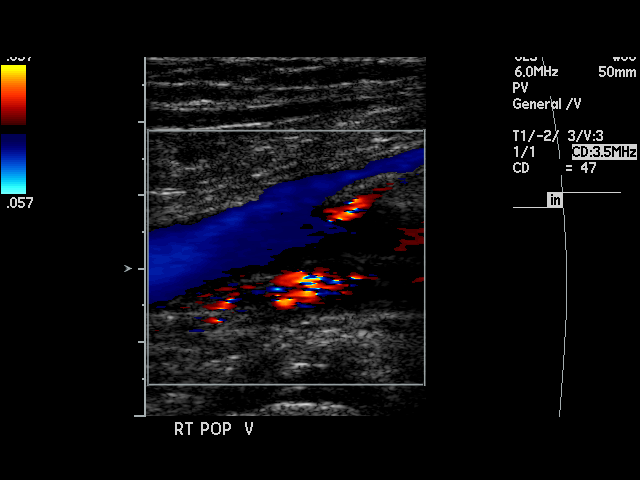
[im 45/45]
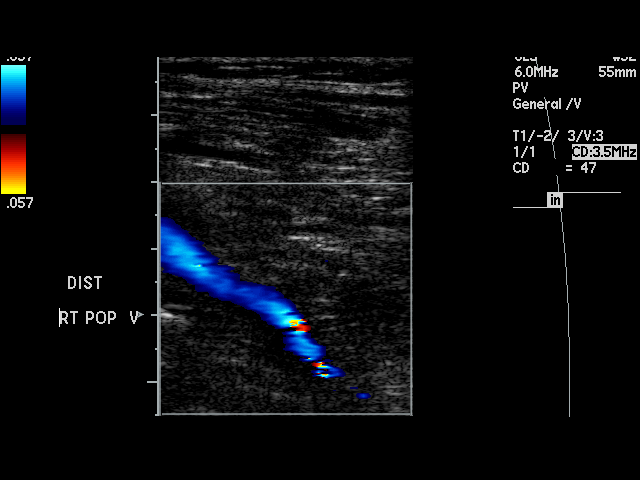

[17 of 24 positions shown; findings below may reference images not displayed]

FINDINGS: Multiple longitudinal and transverse gray-scale as well as color
and spectral Doppler images of the right lower extremity veins were obtained
from the common femoral veins through the popliteal veins.

The right common femoral, greater saphenous, femoral, popliteal veins, and
venous trifurcation are patent, demonstrating normal color-flow and
compressibility. No intraluminal thrombus is identified.There is normal
respiratory variation and augmentation demonstrated at all vein levels.

There is a large right popliteal fossa cyst extending inferiorly and into
the medial aspect of the calf most consistent with a Baker's cyst.
IMPRESSION: No evidence of DVT in the right lower extremity.

## 2012-03-10 ENCOUNTER — Other Ambulatory Visit: Payer: Self-pay | Admitting: *Deleted

## 2012-03-10 MED ORDER — SAXAGLIPTIN HCL 5 MG PO TABS: ORAL_TABLET | ORAL | Status: DC

## 2012-03-10 MED ORDER — AMLODIPINE-ATORVASTATIN 5-20 MG PO TABS: ORAL_TABLET | ORAL | Status: DC

## 2012-05-19 ENCOUNTER — Other Ambulatory Visit: Payer: Self-pay | Admitting: *Deleted

## 2012-05-19 MED ORDER — RAMIPRIL 10 MG PO CAPS: 10.0000 mg | ORAL_CAPSULE | Freq: Every day | ORAL | Status: DC

## 2012-05-19 MED ORDER — METFORMIN HCL ER 500 MG PO TB24: ORAL_TABLET | ORAL | Status: DC

## 2012-07-09 ENCOUNTER — Other Ambulatory Visit: Payer: Self-pay | Admitting: *Deleted

## 2012-07-09 MED ORDER — SAXAGLIPTIN HCL 5 MG PO TABS: ORAL_TABLET | ORAL | Status: DC

## 2012-07-09 MED ORDER — AMLODIPINE-ATORVASTATIN 5-20 MG PO TABS: ORAL_TABLET | ORAL | Status: DC

## 2012-07-24 ENCOUNTER — Other Ambulatory Visit: Payer: Self-pay

## 2012-09-16 ENCOUNTER — Other Ambulatory Visit: Payer: Self-pay | Admitting: *Deleted

## 2012-09-16 MED ORDER — AMLODIPINE-ATORVASTATIN 5-20 MG PO TABS: ORAL_TABLET | ORAL | Status: DC

## 2012-09-16 MED ORDER — METFORMIN HCL ER 500 MG PO TB24: ORAL_TABLET | ORAL | Status: DC

## 2012-09-16 MED ORDER — SAXAGLIPTIN HCL 5 MG PO TABS: ORAL_TABLET | ORAL | Status: DC

## 2012-09-16 MED ORDER — RAMIPRIL 10 MG PO CAPS: 10.0000 mg | ORAL_CAPSULE | Freq: Every day | ORAL | Status: DC

## 2013-01-26 ENCOUNTER — Other Ambulatory Visit: Payer: Self-pay | Admitting: Family Medicine

## 2013-02-09 ENCOUNTER — Other Ambulatory Visit: Payer: Self-pay | Admitting: Family Medicine

## 2013-02-10 ENCOUNTER — Encounter: Payer: Self-pay | Admitting: Gastroenterology

## 2013-02-11 LAB — HM DIABETES EYE EXAM

## 2013-02-23 ENCOUNTER — Encounter: Payer: Self-pay | Admitting: Family Medicine

## 2013-03-02 ENCOUNTER — Other Ambulatory Visit (INDEPENDENT_AMBULATORY_CARE_PROVIDER_SITE_OTHER): Payer: BC Managed Care – PPO

## 2013-03-02 DIAGNOSIS — Z79899 Other long term (current) drug therapy: Secondary | ICD-10-CM

## 2013-03-02 DIAGNOSIS — Z125 Encounter for screening for malignant neoplasm of prostate: Secondary | ICD-10-CM

## 2013-03-02 DIAGNOSIS — E119 Type 2 diabetes mellitus without complications: Secondary | ICD-10-CM

## 2013-03-02 DIAGNOSIS — E785 Hyperlipidemia, unspecified: Secondary | ICD-10-CM

## 2013-03-02 LAB — LIPID PANEL
CHOL/HDL RATIO: 3
Cholesterol: 125 mg/dL (ref 0–200)
HDL: 40.8 mg/dL (ref >39.00)
LDL CALC: 61 mg/dL (ref 0–99)
TRIGLYCERIDES: 117 mg/dL (ref 0.0–149.0)
VLDL: 23.4 mg/dL (ref 0.0–40.0)

## 2013-03-02 LAB — CBC WITH DIFFERENTIAL/PLATELET
BASOS ABS: 0 10*3/uL (ref 0.0–0.1)
Basophils Relative: 0.4 % (ref 0.0–3.0)
EOS ABS: 0.1 10*3/uL (ref 0.0–0.7)
Eosinophils Relative: 1.3 % (ref 0.0–5.0)
HCT: 51.3 % (ref 39.0–52.0)
HEMOGLOBIN: 16.7 g/dL (ref 13.0–17.0)
LYMPHS PCT: 25.7 % (ref 12.0–46.0)
Lymphs Abs: 2.7 10*3/uL (ref 0.7–4.0)
MCHC: 32.7 g/dL (ref 30.0–36.0)
MCV: 98.8 fl (ref 78.0–100.0)
Monocytes Absolute: 0.6 10*3/uL (ref 0.1–1.0)
Monocytes Relative: 5.8 % (ref 3.0–12.0)
NEUTROS ABS: 7 10*3/uL (ref 1.4–7.7)
NEUTROS PCT: 66.8 % (ref 43.0–77.0)
Platelets: 202 10*3/uL (ref 150.0–400.0)
RBC: 5.19 Mil/uL (ref 4.22–5.81)
RDW: 13.5 % (ref 11.5–14.6)
WBC: 10.4 10*3/uL (ref 4.5–10.5)

## 2013-03-02 LAB — PSA: PSA: 0.75 ng/mL (ref 0.10–4.00)

## 2013-03-02 LAB — HEMOGLOBIN A1C: Hgb A1c MFr Bld: 12.8 % — ABNORMAL HIGH (ref 4.6–6.5)

## 2013-03-09 ENCOUNTER — Encounter: Payer: Self-pay | Admitting: Family Medicine

## 2013-03-09 ENCOUNTER — Ambulatory Visit (INDEPENDENT_AMBULATORY_CARE_PROVIDER_SITE_OTHER): Payer: BC Managed Care – PPO | Admitting: Family Medicine

## 2013-03-09 VITALS — BP 130/72 | HR 82 | Temp 98.2°F | Ht 71.25 in | Wt 195.2 lb

## 2013-03-09 DIAGNOSIS — F528 Other sexual dysfunction not due to a substance or known physiological condition: Secondary | ICD-10-CM

## 2013-03-09 DIAGNOSIS — E1142 Type 2 diabetes mellitus with diabetic polyneuropathy: Secondary | ICD-10-CM

## 2013-03-09 DIAGNOSIS — E1165 Type 2 diabetes mellitus with hyperglycemia: Secondary | ICD-10-CM

## 2013-03-09 DIAGNOSIS — E119 Type 2 diabetes mellitus without complications: Secondary | ICD-10-CM

## 2013-03-09 DIAGNOSIS — E1149 Type 2 diabetes mellitus with other diabetic neurological complication: Secondary | ICD-10-CM

## 2013-03-09 DIAGNOSIS — E1140 Type 2 diabetes mellitus with diabetic neuropathy, unspecified: Secondary | ICD-10-CM

## 2013-03-09 DIAGNOSIS — IMO0002 Reserved for concepts with insufficient information to code with codable children: Secondary | ICD-10-CM

## 2013-03-09 DIAGNOSIS — K5730 Diverticulosis of large intestine without perforation or abscess without bleeding: Secondary | ICD-10-CM

## 2013-03-09 DIAGNOSIS — Z0000 Encounter for general adult medical examination without abnormal findings: Secondary | ICD-10-CM

## 2013-03-09 DIAGNOSIS — K635 Polyp of colon: Secondary | ICD-10-CM

## 2013-03-09 DIAGNOSIS — Q550 Absence and aplasia of testis: Secondary | ICD-10-CM

## 2013-03-09 DIAGNOSIS — E785 Hyperlipidemia, unspecified: Secondary | ICD-10-CM

## 2013-03-09 DIAGNOSIS — I10 Essential (primary) hypertension: Secondary | ICD-10-CM

## 2013-03-09 DIAGNOSIS — K5790 Diverticulosis of intestine, part unspecified, without perforation or abscess without bleeding: Secondary | ICD-10-CM

## 2013-03-09 DIAGNOSIS — D126 Benign neoplasm of colon, unspecified: Secondary | ICD-10-CM

## 2013-03-09 MED ORDER — GLIPIZIDE ER 10 MG PO TB24: 20.0000 mg | ORAL_TABLET | Freq: Every day | ORAL | Status: DC

## 2013-03-09 MED ORDER — ATORVASTATIN CALCIUM 20 MG PO TABS: 20.0000 mg | ORAL_TABLET | Freq: Every day | ORAL | Status: DC

## 2013-03-09 NOTE — Patient Instructions (Addendum)
Glipizide XL, 1 tablet a day for 1 week, then increase to 2 week  HIGH PROTEIN PASTA FROM BARILLA  Stop caduet Start ATORVASTATIN (HAS CHOL MEDS)  F/u 4 months  [facility] Low Glycemic Diet (Source: [facility], 2006)  Low Glycemic Foods (20-49) (Decrease risk of developing heart disease)  Best for Diabetes: Eat Mostly these  Breakfast Cereals: All-Bran All-Bran Fruit 'n Oats Fiber One Oatmeal (not instant) Oat bran  Fruits and fruit juices: (Limit to 1-2 servings per day) Apples Apricots (fresh & dried) Blackberries Blueberries Cherries Cranberries Peaches Pears Plums Prunes Grapefruit Raspberries Strawberries Tangerine  Juices: Apple juice Grapefruit juice Tomato juice  Beans and legumes (fresh-cooked): Black-eyed peas Butter beans Chick peas Lentils  Green beans Lima beans Kidney beans Navy beans Pinto beans Snow peas  Non-starchy vegetables: Asparagus, avocado, broccoli, cabbage, cauliflower, celery, cucumber, greens, lettuce, mushrooms, peppers, tomatoes, okra, onions, spinach, summer squash  Grains: Barley Bulgur Rye Wild rice  Nuts and oils : Almonds Peanuts Sunflower seeds Hazelnuts Pecans Walnuts Oils that are liquid at room temperature  Dairy, fish, meat, soy, and eggs: Milk, skim Lowfat cheese Yogurt, lowfat, fruit sugar sweetened Lean red meat Fish  Skinless chicken & [place] Shellfish Egg whites (up to 3 daily) Soy products  Egg yolks (up to 7 or _____ per week) Moderate Glycemic Foods (50-69)  OK sometimes with diabetes  Breakfast Cereals: Bran Buds Bran Chex Just Right Mini-Wheats  Special K Swiss muesli  Fruits: Banana (under-ripe) Dates Figs Grapes Kiwi Mango Oranges Raisins  Fruit Juices: Cranberry juice Orange juice  Beans and legumes: [place]-type baked beans Canned pinto, kidney, or navy beans Green peas  Vegetables: Beets Carrots  Sweet potato Yam Corn on the cob  Breads: Pita (pocket)  bread Oat bran bread Pumpernickel bread Rye bread Wheat bread, high fiber   Grains: Cornmeal Rice, brown Rice, white Couscous  Pasta: Macaroni Pizza, cheese Ravioli, meat filled Spaghetti, white   Nuts: Cashews Macadamia  Snacks: Chocolate Ice cream, lowfat Muffin Popcorn High Glycemic Foods (70-100)  Rare: Eat occaisionally with diabetes  THESE ARE THE WORST KIND OF FOODS FOR YOUR DIABETES  Breakfast Cereals: Cheerios Corn Chex Corn Flakes Cream of Wheat Grape Nuts Grape Nut Flakes Grits Nutri-Grain Puffed Rice Puffed Wheat Rice Chex Rice Krispies Shredded Wheat Team Total  Fruits: Pineapple Watermelon Banana (over-ripe) Beverages: Sodas, sweet tea, pineapple juice  Vegetables: Potato, baked, boiled, fried, mashed [place] fries Canned or frozen corn Parsnips Winter squash  Breads: Most breads (white and whole grain) Bagels Bread sticks Bread stuffing Kaiser roll Dinner rolls  Grains: Rice, instant Tapioca, with milk Candy and most cookies  Snacks: Donuts Corn chips Jelly beans Pretzels Pastries

## 2013-03-09 NOTE — Progress Notes (Signed)
Pre visit review using our clinic review tool, if applicable. No additional management support is needed unless otherwise documented below in the visit note. 

## 2013-03-09 NOTE — Progress Notes (Signed)
Date:  03/09/2013   Name:  Connor Moore   DOB:  04/10/1950   MRN:  [id number] Gender: male Age: 63 y.o.  Primary Physician:  Connor Loffler, MD   Chief Complaint: Annual Exam   Subjective:   History of Present Illness:  Connor Moore is a 63 y.o. pleasant patient who presents with the following:  Preventative Health Maintenance Visit and f/u of chronic problems.  [id number]  D/c caduet - atorvastatin  Dm: diet.   "I would rather die than be on insulin." No.   Health Maintenance Summary Reviewed and updated, unless pt declines services.  Tobacco History Reviewed. Alcohol: No concerns, no excessive use Exercise Habits: golf 2 times a week. STD concerns: no risk or activity to increase risk Drug Use: None Encouraged self-testicular check  Health Maintenance  Topic Date Due  . Foot Exam  05/20/2010  . Urine Microalbumin  12/06/2011  . Zostavax  03/30/2012  . Influenza Vaccine  08/15/2012  . Hemoglobin A1c  08/30/2013  . Ophthalmology Exam  02/11/2014  . Pneumococcal Polysaccharide Vaccine (##2) 12/07/2015  . Colonoscopy  03/23/2018  . Tetanus/tdap  12/06/2020    There is no immunization history for the selected administration types on file for this patient.  Diabetes Mellitus: Tolerating Medications: yes Compliance with diet: POOR Exercise: minimal / intermittent Avg blood sugars at home: not checking Foot problems: none Hypoglycemia: none No nausea, vomitting. NOTABLE POLYURIA AND BLURRED VISION.  Lab Results  Component Value Date   HGBA1C 12.8* 03/02/2013   HGBA1C 7.8* 10/22/2011   HGBA1C 7.1* 12/06/2010   Lab Results  Component Value Date   MICROALBUR 0.6 12/06/2010   LDLCALC 61 03/02/2013   CREATININE 0.9 12/06/2010    Wt Readings from Last 3 Encounters:  03/09/13 195 lb 4 oz (88.565 kg)  11/20/11 229 lb (103.874 kg)  10/22/11 227 lb 8 oz (103.193 kg)    Body mass index is 27.03 kg/(m^2).   Lipids: Doing well, stable.  Tolerating meds fine with no SE. Panel reviewed with patient.  Lipids:    Component Value Date/Time   CHOL 125 03/02/2013 1005   TRIG 117.0 03/02/2013 1005   HDL 40.80 03/02/2013 1005   VLDL 23.4 03/02/2013 1005   CHOLHDL 3 03/02/2013 1005    Lab Results  Component Value Date   ALT 26 12/06/2010   AST 22 12/06/2010   ALKPHOS 71 12/06/2010   BILITOT 1.0 12/06/2010    HTN: Tolerating all medications without side effects Stable and at goal No CP, no sob. No HA.  BP Readings from Last 3 Encounters:  03/09/13 130/72  11/20/11 128/80  10/22/11 [id number]    Basic Metabolic Panel:    Component Value Date/Time   NA 136 12/06/2010 0910   K 5.4* 12/06/2010 0910   CL 103 12/06/2010 0910   CO2 26 12/06/2010 0910   BUN 26* 12/06/2010 0910   CREATININE 0.9 12/06/2010 0910   GLUCOSE 158* 12/06/2010 0910   CALCIUM 9.7 12/06/2010 0910    ED is helped with meds.   Patient Active Problem List   Diagnosis Date Noted  . 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 Diverticulosis   . Colon polyps   . Diabetic neuropathy 12/07/2010  . TOBACCO USE 05/19/2009  . Type 2 diabetes, uncontrolled, with neuropathy 04/01/2008  . HYPERLIPIDEMIA 02/12/2008  . ERECTILE DYSFUNCTION 02/12/2008  . HYPERTENSION 02/12/2008    Past Medical History  Diagnosis Date  . Diabetes mellitus type  II   . HLD (hyperlipidemia)   . HTN (hypertension)   . ED (erectile dysfunction)     rare  . Obesity   . Diverticulosis   . Colon polyps   . Diabetic neuropathy 12/07/2010    Past Surgical History  Procedure Laterality Date  . Appendectomy    . Inguinal hernia repair  1974  . Tonsillectomy    . Left hydrocele surgery  1974  . Testicle removal  1974    Left  . Replacement unicondylar joint knee  2009  . Vasectomy  1981  . Knee arthroscopy  1986    Right    History   Social History  . Marital Status: Married    Spouse Name: [name]    Number of Children: [name]  . Years of  Education: [name]   Occupational History  . General Contractor, [employer]    Social History Main Topics  . Smoking status: Former Smoker -- 34 years    Start date: 09/06/2012  . Smokeless tobacco: Never Used     Comment: ? quit 2011  . Alcohol Use: Yes     Comment: Socially  . Drug Use: No  . Sexual Activity: Not on file   Other Topics Concern  . Not on file   Social History Narrative  . No narrative on file    Family History  Problem Relation Age of Onset  . Diabetes Father   . Diabetes Sister   . Mental illness Mother     ?   [name] Cancer Father     mouth    No Known Allergies  Medication list has been reviewed and updated.  Review of Systems:  General: Denies fever, chills, sweats. 30 POUND WEIGHT LOSS. Eyes: BLURRED VISION. ENT: Denies earache, sore throat, and hoarseness. Cardiovascular: Denies chest pains, palpitations, dyspnea on exertion Respiratory: Denies cough, dyspnea at rest,wheeezing Breast: no concerns about lumps GI: Denies nausea, vomiting, diarrhea, constipation, change in bowel habits, abdominal pain, melena, hematochezia GU: Denies penile discharge, SIGNIFICANT FREQUENT URINATION. ED. Musculoskeletal: Denies back pain, joint pain Derm: Denies rash, itching Neuro: Denies  paresthesias, frequent falls, frequent headaches Psych: Denies depression, anxiety Endocrine: Denies cold intolerance, heat intolerance, polydipsia Heme: Denies enlarged lymph nodes Allergy: No hayfever  Objective:   Physical Examination: BP 130/72  Pulse 82  Temp(Src) 98.2 F (36.8 C) (Oral)  Ht 5' 11.25" (1.81 m)  Wt 195 lb 4 oz (88.565 kg)  BMI 27.03 kg/m2  SpO2 96% Ideal Body Weight: Weight in (lb) to have BMI = 25: 180.1  GEN: well developed, well nourished, no acute distress Eyes: conjunctiva and lids normal, PERRLA, EOMI ENT: TM clear, nares clear, oral exam WNL Neck: supple, no lymphadenopathy, no thyromegaly, no JVD Pulm: clear to auscultation and  percussion, respiratory effort normal CV: regular rate and rhythm, S1-S2, no murmur, rub or gallop, no bruits, peripheral pulses normal and symmetric, no cyanosis, clubbing, edema or varicosities GI: soft, non-tender; no hepatosplenomegaly, masses; active bowel sounds all quadrants GU: no hernia, penile discharge. LEFT testicle absent. Lymph: no cervical, axillary or inguinal adenopathy MSK: gait normal, muscle tone and strength WNL, no joint swelling, effusions, discoloration, crepitus  SKIN: clear, good turgor, color WNL, no rashes, lesions, or ulcerations Neuro: normal mental status, normal strength, sensation, and motion Psych: alert; oriented to person, place and time, normally interactive and not anxious or depressed in appearance.  All labs reviewed with patient.  Lipids:    Component Value Date/Time   CHOL 125  03/02/2013 1005   TRIG 117.0 03/02/2013 1005   HDL 40.80 03/02/2013 1005   VLDL 23.4 03/02/2013 1005   CHOLHDL 3 03/02/2013 1005    CBC:    Component Value Date/Time   WBC 10.4 03/02/2013 1005   HGB 16.7 03/02/2013 1005   HCT 51.3 03/02/2013 1005   PLT 202.0 03/02/2013 1005   MCV 98.8 03/02/2013 1005   NEUTROABS 7.0 03/02/2013 1005   LYMPHSABS 2.7 03/02/2013 1005   MONOABS 0.6 03/02/2013 1005   EOSABS 0.1 03/02/2013 1005   BASOSABS 0.0 03/02/2013 0630    Basic Metabolic Panel:    Component Value Date/Time   NA 136 12/06/2010 0910   K 5.4* 12/06/2010 0910   CL 103 12/06/2010 0910   CO2 26 12/06/2010 0910   BUN 26* 12/06/2010 0910   CREATININE 0.9 12/06/2010 0910   GLUCOSE 158* 12/06/2010 0910   CALCIUM 9.7 12/06/2010 0910    Lab Results  Component Value Date   ALT 26 12/06/2010   AST 22 12/06/2010   ALKPHOS 71 12/06/2010   BILITOT 1.0 12/06/2010    No results found for this basename: TSH    Lab Results  Component Value Date   PSA 0.75 03/02/2013   PSA 0.56 12/06/2010   PSA 1.23 04/04/2009    Assessment & Plan:   Health Maintenance Exam: The patient's  preventative maintenance and recommended screening tests for an annual wellness exam were reviewed in full today. Brought up to date unless services declined.  Counselled on the importance of diet, exercise, and its role in overall health and mortality. The patient's FH and SH was reviewed, including their home life, tobacco status, and drug and alcohol status.  Routine general medical examination at a health care facility  DM2 (diabetes mellitus, type 2) - Plan: Microalbumin / creatinine urine ratio  Type 2 diabetes, uncontrolled, with neuropathy: diabetes is doing very poorly with his a1c at 32. He is eating biscuits, 1-2 egg and cheese for breakfast, regularly pasta, pizza, maccaroni, and understands he is doing a poor job controlling his diet.   I tried to be as frank as possible. I recommended that he go on insulin, and I am doubtful his BS will normalize without it.  He told me, "I would rather die than go on insulin."   He is going to try to work on his diet, exercise more, and we are going to add glipizide and titrate it up.   F/u in 3-4 months.  >15 minutes spent in face to face time with patient, >50% spent in counselling or coordination of care: over and above health maintenance, almost all spent in DM discussion.  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   GLIPIZIDE (GLUCOTROL XL) 10 MG 24 HR TABLET    Take 2 tablets (20 mg total) by mouth daily with breakfast.   No orders of the defined types were placed in this encounter.   Signed,  [name]. Ayjah Show, MD, [place] at [facility] [place] [address] Phone: [phone] Fax:  [phone]  Patient Instructions   Glipizide XL, 1 tablet a day for 1 week, then increase to 2 week  HIGH PROTEIN PASTA FROM BARILLA  Stop caduet Start ATORVASTATIN (HAS CHOL MEDS)  F/u 4 months  [facility] Low Glycemic Diet (Source: [facility], 2006)  Low Glycemic Foods (20-49) (Decrease risk of developing heart disease)  Best for Diabetes: Eat Mostly these  Breakfast Cereals: All-Bran All-Bran Fruit 'n Oats Fiber One Oatmeal (not instant) Oat bran  Fruits and fruit juices: (Limit to 1-2 servings per day) Apples Apricots (fresh & dried) Blackberries Blueberries Cherries Cranberries Peaches Pears Plums Prunes Grapefruit Raspberries Strawberries Tangerine  Juices: Apple juice Grapefruit juice Tomato juice  Beans and legumes (fresh-cooked): Black-eyed peas Butter beans Chick peas Lentils  Green beans Lima beans Kidney beans Navy beans Pinto beans Snow peas  Non-starchy vegetables: Asparagus, avocado, broccoli, cabbage, cauliflower, celery, cucumber, greens, lettuce, mushrooms, peppers, tomatoes, okra, onions, spinach, summer squash  Grains: Barley Bulgur Rye Wild rice  Nuts and oils : Almonds Peanuts Sunflower seeds Hazelnuts Pecans Walnuts Oils that are liquid at room temperature  Dairy, fish, meat, soy, and eggs: Milk, skim Lowfat cheese Yogurt, lowfat, fruit sugar sweetened Lean red meat Fish  Skinless chicken & [place] Shellfish Egg whites (up to 3 daily) Soy products  Egg yolks (up to 7 or _____ per week) Moderate Glycemic Foods (50-69)  OK sometimes with diabetes  Breakfast Cereals: Bran Buds Bran Chex Just Right Mini-Wheats  Special K Swiss muesli  Fruits: Banana (under-ripe) Dates Figs Grapes Kiwi Mango Oranges Raisins  Fruit Juices: Cranberry juice Orange juice  Beans and legumes: [place]-type baked beans Canned pinto, kidney, or navy beans Green peas  Vegetables: Beets Carrots  Sweet potato Yam Corn  on the cob  Breads: Pita (pocket) bread Oat bran bread Pumpernickel bread Rye bread Wheat bread, high fiber   Grains: Cornmeal Rice, brown Rice, white Couscous  Pasta: Macaroni Pizza, cheese Ravioli, meat filled Spaghetti, white   Nuts: Cashews Macadamia  Snacks: Chocolate Ice cream, lowfat Muffin Popcorn High Glycemic Foods (70-100)  Rare: Eat occaisionally with diabetes  THESE ARE THE WORST KIND OF FOODS FOR YOUR DIABETES  Breakfast Cereals: Cheerios Corn Chex Corn Flakes Cream of Wheat Grape Nuts Grape Nut Flakes Grits Nutri-Grain Puffed Rice Puffed Wheat Rice Chex Rice Krispies Shredded Wheat Team Total  Fruits: Pineapple Watermelon Banana (over-ripe) Beverages: Sodas, sweet tea, pineapple juice  Vegetables: Potato, baked, boiled, fried, mashed [place] fries Canned or frozen corn Parsnips Winter squash  Breads: Most breads (white and whole grain) Bagels Bread sticks Bread stuffing Kaiser roll Dinner rolls  Grains: Rice, instant Tapioca, with milk Candy and most cookies  Snacks: Donuts Corn chips Jelly beans Pretzels Pastries      Patient's Medications  New Prescriptions   ATORVASTATIN (LIPITOR) 20 MG TABLET    Take 1 tablet (20 mg total) by mouth daily.   GLIPIZIDE (GLUCOTROL XL) 10 MG 24 HR TABLET    Take 2 tablets (20 mg total) by mouth daily with breakfast.  Previous Medications   ASPIRIN 81 MG TABLET    Take 81 mg by mouth daily.     GLUCOSE BLOOD (GLUCOMETER ELITE TEST STRIPS) TEST STRIP    1 each by Other route 2 (two) times daily. Use as instructed    LANCETS 28G MISC    by Does not apply route as directed.     METFORMIN (GLUCOPHAGE-XR) 500 MG 24 HR TABLET    Take four tablets by mouth daily.   MULTIPLE VITAMIN (MULTIVITAMIN) TABLET    Take 1 tablet by mouth daily.   RAMIPRIL (  ALTACE) 10 MG CAPSULE    Take 1 capsule (10 mg total) by mouth daily.   SAXAGLIPTIN HCL (ONGLYZA) 5 MG TABS TABLET    Take 1 tablet (5 mg total) by mouth  daily.   VARDENAFIL (LEVITRA) 10 MG TABLET    Take 10 mg by mouth daily as needed. 30 minutes prior to intercourse   Modified Medications   No medications on file  Discontinued Medications   ALBUTEROL (PROVENTIL HFA;VENTOLIN HFA) 108 (90 BASE) MCG/ACT INHALER    Inhale 2 puffs into the lungs every 6 (six) hours as needed for wheezing.   AMLODIPINE-ATORVASTATIN (CADUET) 5-20 MG PER TABLET    Take 1 tablet by mouth daily.   LEVOFLOXACIN (LEVAQUIN) 500 MG TABLET    Take 1 tablet (500 mg total) by mouth daily.

## 2013-03-10 ENCOUNTER — Encounter: Payer: Self-pay | Admitting: Family Medicine

## 2013-03-10 DIAGNOSIS — K635 Polyp of colon: Secondary | ICD-10-CM | POA: Insufficient documentation

## 2013-03-10 DIAGNOSIS — K5790 Diverticulosis of intestine, part unspecified, without perforation or abscess without bleeding: Secondary | ICD-10-CM | POA: Insufficient documentation

## 2013-03-10 DIAGNOSIS — Q550 Absence and aplasia of testis: Secondary | ICD-10-CM | POA: Insufficient documentation

## 2013-03-11 LAB — MICROALBUMIN / CREATININE URINE RATIO
CREATININE, U: 41.7 mg/dL
Microalb Creat Ratio: 1.4 mg/g (ref 0.0–30.0)
Microalb, Ur: 0.6 mg/dL (ref 0.0–1.9)

## 2013-03-13 ENCOUNTER — Encounter: Payer: Self-pay | Admitting: *Deleted

## 2013-04-15 ENCOUNTER — Other Ambulatory Visit: Payer: Self-pay | Admitting: Family Medicine

## 2013-07-08 ENCOUNTER — Ambulatory Visit (INDEPENDENT_AMBULATORY_CARE_PROVIDER_SITE_OTHER): Payer: BC Managed Care – PPO | Admitting: Family Medicine

## 2013-07-08 ENCOUNTER — Encounter: Payer: Self-pay | Admitting: Family Medicine

## 2013-07-08 VITALS — BP 130/80 | HR 75 | Temp 98.1°F | Ht 71.25 in | Wt 185.5 lb

## 2013-07-08 DIAGNOSIS — E1142 Type 2 diabetes mellitus with diabetic polyneuropathy: Secondary | ICD-10-CM

## 2013-07-08 DIAGNOSIS — IMO0002 Reserved for concepts with insufficient information to code with codable children: Secondary | ICD-10-CM

## 2013-07-08 DIAGNOSIS — E1140 Type 2 diabetes mellitus with diabetic neuropathy, unspecified: Secondary | ICD-10-CM

## 2013-07-08 DIAGNOSIS — E1149 Type 2 diabetes mellitus with other diabetic neurological complication: Secondary | ICD-10-CM

## 2013-07-08 DIAGNOSIS — E1165 Type 2 diabetes mellitus with hyperglycemia: Principal | ICD-10-CM

## 2013-07-08 LAB — BASIC METABOLIC PANEL
BUN: 14 mg/dL (ref 6–23)
CHLORIDE: 102 meq/L (ref 96–112)
CO2: 24 meq/L (ref 19–32)
Calcium: 9.7 mg/dL (ref 8.4–10.5)
Creatinine, Ser: 0.8 mg/dL (ref 0.4–1.5)
GFR: 112.43 mL/min (ref >60.00)
Glucose, Bld: 337 mg/dL — ABNORMAL HIGH (ref 70–99)
POTASSIUM: 4.7 meq/L (ref 3.5–5.1)
Sodium: 135 mEq/L (ref 135–145)

## 2013-07-08 LAB — HEMOGLOBIN A1C: Hgb A1c MFr Bld: 11.3 % — ABNORMAL HIGH (ref 4.6–6.5)

## 2013-07-08 NOTE — Progress Notes (Signed)
[place] [address] Phone: [phone] Fax: [phone]  Patient ID: Connor Moore MRN: [id number], DOB: 1950-06-14, 63 y.o. Date of Encounter: 07/08/2013  Primary Physician:  [name], MD   Chief Complaint: Follow-up   Subjective:   History of Present Illness:  Connor Moore is a 63 y.o. very pleasant male patient who presents with the following:  Wt Readings from Last 3 Encounters:  07/08/13 185 lb 8 oz (84.142 kg)  03/09/13 195 lb 4 oz (88.565 kg)  11/20/11 229 lb (103.874 kg)    BP Readings from Last 3 Encounters:  07/08/13 130/80  03/09/13 130/72  11/20/11 128/80   Diabetes Mellitus: Tolerating Medications: yes Compliance with diet: fair Exercise: minimal / intermittent Avg blood sugars at home: 130 in AM Foot problems: none Hypoglycemia: none No nausea, vomitting, blurred vision, polyuria.  Lab Results  Component Value Date   HGBA1C 12.8* 03/02/2013   HGBA1C 7.8* 10/22/2011   HGBA1C 7.1* 12/06/2010   Lab Results  Component Value Date   MICROALBUR 0.6 03/09/2013   LDLCALC 61 03/02/2013   CREATININE 0.9 12/06/2010    Wt Readings from Last 3 Encounters:  07/08/13 185 lb 8 oz (84.142 kg)  03/09/13 195 lb 4 oz (88.565 kg)  11/20/11 229 lb (103.874 kg)    Body mass index is 25.68 kg/(m^2).   Past Medical History, Surgical History, Social History, Family History, Problem List, Medications, and Allergies have been reviewed and updated if relevant.  Review of Systems:  GEN: No acute illnesses, no fevers, chills. GI: No n/v/d, eating normally Pulm: No SOB Interactive and getting along well at home.  Otherwise, ROS is as per the HPI.  Objective:   Physical Examination: BP 130/80  Pulse 75  Temp(Src) 98.1 F (36.7 C) (Oral)  Ht 5' 11.25" (1.81 m)  Wt 185 lb 8 oz (84.142 kg)  BMI 25.68 kg/m2   GEN: WDWN, NAD, Non-toxic, A & O x 3 HEENT: Atraumatic, Normocephalic. Neck supple. No masses, No LAD. Ears and Nose:  No external deformity. CV: RRR, No M/G/R. No JVD. No thrill. No extra heart sounds. PULM: CTA B, no wheezes, crackles, rhonchi. No retractions. No resp. distress. No accessory muscle use. EXTR: No c/c/e NEURO Normal gait.  PSYCH: Normally interactive. Conversant. Not depressed or anxious appearing.  Calm demeanor.   Laboratory and Imaging Data:  Assessment & Plan:   Type 2 diabetes, uncontrolled, with neuropathy - Plan: Hemoglobin Q7Y, Basic metabolic panel  Improving, check labs. Declines all vaccines. Nervous about insulin.  New Prescriptions   No medications on file   Modified Medications   No medications on file   Orders Placed This Encounter  Procedures  . Hemoglobin A1c  . Basic metabolic panel   Follow-up: Return in about 4 months (around 11/07/2013). Unless noted above, the patient is to follow-up if symptoms worsen. Red flags were reviewed with the patient.  Signed,  [name]. Copland, MD, [facility]   Discontinued Medications   No medications on file   Current Medications at Discharge:   Medication List       This list is accurate as of: 07/08/13  8:36 AM.  Always use your most recent med list.               aspirin 81 MG tablet  Take 81 mg by mouth daily.     atorvastatin 20 MG tablet  Commonly known as:  LIPITOR  Take 1 tablet (20 mg total)  by mouth daily.     glipiZIDE 10 MG 24 hr tablet  Commonly known as:  GLUCOTROL XL  Take 2 tablets (20 mg total) by mouth daily with breakfast.     GLUCOMETER ELITE TEST STRIPS test strip  Generic drug:  glucose blood  1 each by Other route 2 (two) times daily. Use as instructed     Lancets 28G Misc  by Does not apply route as directed.     metFORMIN 500 MG 24 hr tablet  Commonly known as:  GLUCOPHAGE-XR  Take 4 tablets (2,000 mg total) by mouth daily with breakfast.     multivitamin tablet  Take 1 tablet by mouth daily.     ramipril 10 MG capsule  Commonly known as:  ALTACE  Take 1  capsule (10 mg total) by mouth daily.     saxagliptin HCl 5 MG Tabs tablet  Commonly known as:  ONGLYZA  Take 1 tablet (5 mg total) by mouth daily.     vardenafil 10 MG tablet  Commonly known as:  LEVITRA  Take 10 mg by mouth daily as needed. 30 minutes prior to intercourse

## 2013-07-08 NOTE — Progress Notes (Signed)
Pre visit review using our clinic review tool, if applicable. No additional management support is needed unless otherwise documented below in the visit note. 

## 2013-07-08 NOTE — Progress Notes (Signed)
l °

## 2013-07-10 ENCOUNTER — Encounter: Payer: Self-pay | Admitting: *Deleted

## 2013-07-13 ENCOUNTER — Other Ambulatory Visit: Payer: Self-pay | Admitting: Family Medicine

## 2013-08-24 ENCOUNTER — Telehealth: Payer: Self-pay | Admitting: Family Medicine

## 2013-08-24 NOTE — Telephone Encounter (Signed)
Yes and set up appt

## 2013-08-24 NOTE — Telephone Encounter (Signed)
Diabetic Bundle.  Report says to recheck A1C.  Last checked 07/09/13, results 11.3.  Last OV on same day says to follow up 10/2013 but no appts scheduled.  Would you like for me to schedule lab visit to check A1C in September?

## 2013-10-02 ENCOUNTER — Other Ambulatory Visit: Payer: BC Managed Care – PPO

## 2013-10-10 ENCOUNTER — Encounter: Payer: Self-pay | Admitting: Gastroenterology

## 2013-11-09 ENCOUNTER — Other Ambulatory Visit: Payer: Self-pay | Admitting: Family Medicine

## 2013-11-18 ENCOUNTER — Other Ambulatory Visit (INDEPENDENT_AMBULATORY_CARE_PROVIDER_SITE_OTHER): Payer: BC Managed Care – PPO

## 2013-11-18 DIAGNOSIS — E1149 Type 2 diabetes mellitus with other diabetic neurological complication: Secondary | ICD-10-CM

## 2013-11-18 DIAGNOSIS — Z79899 Other long term (current) drug therapy: Secondary | ICD-10-CM

## 2013-11-18 LAB — BASIC METABOLIC PANEL
BUN: 10 mg/dL (ref 6–23)
CHLORIDE: 107 meq/L (ref 96–112)
CO2: 21 meq/L (ref 19–32)
CREATININE: 0.9 mg/dL (ref 0.4–1.5)
Calcium: 9.1 mg/dL (ref 8.4–10.5)
GFR: 97.19 mL/min (ref >60.00)
GLUCOSE: 189 mg/dL — AB (ref 70–99)
Potassium: 4.5 mEq/L (ref 3.5–5.1)
Sodium: 137 mEq/L (ref 135–145)

## 2013-11-18 LAB — HEMOGLOBIN A1C: Hgb A1c MFr Bld: 8.1 % — ABNORMAL HIGH (ref 4.6–6.5)

## 2013-12-07 ENCOUNTER — Telehealth: Payer: Self-pay | Admitting: Family Medicine

## 2013-12-07 NOTE — Telephone Encounter (Signed)
All people have to serve jury duty, unless there is a very severe reason like ongoing cancer treatment.   I am doubtful that his need for a hearing aid will "get him out" of jury duty. 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

## 2013-12-07 NOTE — Telephone Encounter (Signed)
[phone] pt cell  Helene Kelp (spouse) called stating [name] received a summons for federal jury (6 weeks).  Helene Kelp stated [name] has 2 hearing aides and still has problems hearing  Can dr copland write a note to get him out of jury duty.  They need this asap.  Pt goes to [employer] to get his hearing aides

## 2013-12-07 NOTE — Telephone Encounter (Addendum)
[name] notified that we will need his jury summons with his juror number on it before Dr. Lorelei Pont can write him a letter.  He will drop by the paperwork.

## 2013-12-08 ENCOUNTER — Telehealth: Payer: Self-pay | Admitting: Family Medicine

## 2013-12-08 NOTE — Telephone Encounter (Signed)
Pt walked in to drop off copy of form from [place] [address] for [employer]. Pt would like a letter excusing him from jury due to poor hearing. Please see sheet attached. Pt may be reached at [phone] when letter is ready. Papers placed on Donna's desk to give to Dr Lorelei Pont. Thank you!

## 2013-12-08 NOTE — Telephone Encounter (Signed)
Form placed in Dr. Lillie Fragmin in box to write letter.

## 2013-12-08 NOTE — Telephone Encounter (Signed)
Connor Moore notified letter is ready to be picked up at front desk.

## 2014-01-05 LAB — HM DIABETES EYE EXAM

## 2014-01-20 ENCOUNTER — Encounter: Payer: Self-pay | Admitting: Family Medicine

## 2014-01-25 ENCOUNTER — Other Ambulatory Visit: Payer: Self-pay | Admitting: *Deleted

## 2014-01-25 MED ORDER — SAXAGLIPTIN HCL 5 MG PO TABS: ORAL_TABLET | ORAL | Status: DC

## 2014-05-16 ENCOUNTER — Other Ambulatory Visit: Payer: Self-pay | Admitting: Family Medicine

## 2014-06-22 ENCOUNTER — Other Ambulatory Visit: Payer: Self-pay | Admitting: Family Medicine

## 2014-06-22 DIAGNOSIS — E785 Hyperlipidemia, unspecified: Secondary | ICD-10-CM

## 2014-06-22 DIAGNOSIS — Z125 Encounter for screening for malignant neoplasm of prostate: Secondary | ICD-10-CM

## 2014-06-22 DIAGNOSIS — Z79899 Other long term (current) drug therapy: Secondary | ICD-10-CM

## 2014-06-22 DIAGNOSIS — E119 Type 2 diabetes mellitus without complications: Secondary | ICD-10-CM

## 2014-06-28 ENCOUNTER — Other Ambulatory Visit (INDEPENDENT_AMBULATORY_CARE_PROVIDER_SITE_OTHER): Payer: 59

## 2014-06-28 DIAGNOSIS — E119 Type 2 diabetes mellitus without complications: Secondary | ICD-10-CM

## 2014-06-28 DIAGNOSIS — E785 Hyperlipidemia, unspecified: Secondary | ICD-10-CM

## 2014-06-28 DIAGNOSIS — Z79899 Other long term (current) drug therapy: Secondary | ICD-10-CM

## 2014-06-28 DIAGNOSIS — Z125 Encounter for screening for malignant neoplasm of prostate: Secondary | ICD-10-CM | POA: Diagnosis not present

## 2014-06-28 LAB — HEPATIC FUNCTION PANEL
ALT: 12 U/L (ref 0–53)
AST: 13 U/L (ref 0–37)
Albumin: 4.3 g/dL (ref 3.5–5.2)
Alkaline Phosphatase: 76 U/L (ref 39–117)
BILIRUBIN TOTAL: 0.4 mg/dL (ref 0.2–1.2)
Bilirubin, Direct: 0.1 mg/dL (ref 0.0–0.3)
Total Protein: 6.7 g/dL (ref 6.0–8.3)

## 2014-06-28 LAB — HEMOGLOBIN A1C: HEMOGLOBIN A1C: 7.7 % — AB (ref 4.6–6.5)

## 2014-06-28 LAB — PSA: PSA: 0.77 ng/mL (ref 0.10–4.00)

## 2014-06-28 LAB — BASIC METABOLIC PANEL
BUN: 15 mg/dL (ref 6–23)
CHLORIDE: 107 meq/L (ref 96–112)
CO2: 23 mEq/L (ref 19–32)
Calcium: 9.5 mg/dL (ref 8.4–10.5)
Creatinine, Ser: 0.76 mg/dL (ref 0.40–1.50)
GFR: 110.37 mL/min (ref >60.00)
Glucose, Bld: 157 mg/dL — ABNORMAL HIGH (ref 70–99)
POTASSIUM: 4.3 meq/L (ref 3.5–5.1)
Sodium: 136 mEq/L (ref 135–145)

## 2014-06-28 LAB — CBC WITH DIFFERENTIAL/PLATELET
BASOS ABS: 0 10*3/uL (ref 0.0–0.1)
Basophils Relative: 0.5 % (ref 0.0–3.0)
EOS ABS: 0.3 10*3/uL (ref 0.0–0.7)
Eosinophils Relative: 3.2 % (ref 0.0–5.0)
HCT: 51 % (ref 39.0–52.0)
Hemoglobin: 17 g/dL (ref 13.0–17.0)
LYMPHS PCT: 29.3 % (ref 12.0–46.0)
Lymphs Abs: 2.8 10*3/uL (ref 0.7–4.0)
MCHC: 33.4 g/dL (ref 30.0–36.0)
MCV: 96.5 fl (ref 78.0–100.0)
MONOS PCT: 6.6 % (ref 3.0–12.0)
Monocytes Absolute: 0.6 10*3/uL (ref 0.1–1.0)
NEUTROS PCT: 60.4 % (ref 43.0–77.0)
Neutro Abs: 5.8 10*3/uL (ref 1.4–7.7)
Platelets: 226 10*3/uL (ref 150.0–400.0)
RBC: 5.28 Mil/uL (ref 4.22–5.81)
RDW: 13.3 % (ref 11.5–15.5)
WBC: 9.7 10*3/uL (ref 4.0–10.5)

## 2014-06-28 LAB — LIPID PANEL
Cholesterol: 134 mg/dL (ref 0–200)
HDL: 27.7 mg/dL — AB (ref >39.00)
Total CHOL/HDL Ratio: 5

## 2014-06-28 LAB — MICROALBUMIN / CREATININE URINE RATIO
Creatinine,U: 197.7 mg/dL
MICROALB UR: 1.3 mg/dL (ref 0.0–1.9)
Microalb Creat Ratio: 0.7 mg/g (ref 0.0–30.0)

## 2014-06-28 LAB — LDL CHOLESTEROL, DIRECT: LDL DIRECT: 47 mg/dL

## 2014-07-05 ENCOUNTER — Encounter: Payer: Self-pay | Admitting: Family Medicine

## 2014-07-05 ENCOUNTER — Ambulatory Visit (INDEPENDENT_AMBULATORY_CARE_PROVIDER_SITE_OTHER): Payer: 59 | Admitting: Family Medicine

## 2014-07-05 VITALS — BP 134/86 | HR 74 | Temp 98.5°F | Ht 71.25 in | Wt 206.8 lb

## 2014-07-05 DIAGNOSIS — Z720 Tobacco use: Secondary | ICD-10-CM

## 2014-07-05 DIAGNOSIS — F17200 Nicotine dependence, unspecified, uncomplicated: Secondary | ICD-10-CM

## 2014-07-05 DIAGNOSIS — E1165 Type 2 diabetes mellitus with hyperglycemia: Secondary | ICD-10-CM

## 2014-07-05 DIAGNOSIS — E1140 Type 2 diabetes mellitus with diabetic neuropathy, unspecified: Secondary | ICD-10-CM | POA: Diagnosis not present

## 2014-07-05 DIAGNOSIS — IMO0002 Reserved for concepts with insufficient information to code with codable children: Secondary | ICD-10-CM

## 2014-07-05 DIAGNOSIS — Z0000 Encounter for general adult medical examination without abnormal findings: Secondary | ICD-10-CM | POA: Diagnosis not present

## 2014-07-05 NOTE — Progress Notes (Signed)
Dr. Frederico Hamman T. Egypt Marchiano, MD, [facility] [place], [address] Phone: [phone] Fax: [phone]  07/05/2014  Patient: Connor Moore, MRN: [id number], DOB: 1950-07-24, 64 y.o.  Primary Physician:  [name], MD  Chief Complaint: Annual Exam  Subjective:   Connor Moore is a 64 y.o. pleasant patient who presents with the following:  Preventative Health Maintenance Visit:  Health Maintenance Summary Reviewed and updated, unless pt declines services.  Tobacco History Reviewed. Alcohol: No concerns, no excessive use Exercise Habits: Some activity, rec at least 30 mins 5 times a week STD concerns: no risk or activity to increase risk Drug Use: None Encouraged self-testicular check  Health Maintenance  Topic Date Due  . HIV Screening  03/31/1967  . ZOSTAVAX  07/04/2024 (Originally 03/30/2012)  . INFLUENZA VACCINE  08/16/2014  . HEMOGLOBIN A1C  12/28/2014  . OPHTHALMOLOGY EXAM  01/06/2015  . FOOT EXAM  07/05/2015  . PNEUMOCOCCAL POLYSACCHARIDE VACCINE (2) 12/07/2015  . COLONOSCOPY  03/23/2018  . TETANUS/TDAP  12/06/2020   Diabetes Mellitus: Tolerating Medications: yes Compliance with diet: fair Exercise: minimal / intermittent Avg blood sugars at home: not checking Foot problems: none Hypoglycemia: none No nausea, vomitting, blurred vision, polyuria.  Lab Results  Component Value Date   HGBA1C 7.7* 06/28/2014   HGBA1C 8.1* 11/18/2013   HGBA1C 11.3* 07/08/2013   Lab Results  Component Value Date   MICROALBUR 1.3 06/28/2014   LDLCALC 61 03/02/2013   CREATININE 0.76 06/28/2014    Wt Readings from Last 3 Encounters:  07/05/14 206 lb 12 oz (93.781 kg)  07/08/13 185 lb 8 oz (84.142 kg)  03/09/13 195 lb 4 oz (88.565 kg)    Body mass index is 28.63 kg/(m^2).   There is no immunization history for the selected administration types on file for this patient. Patient Active Problem List   Diagnosis Date Noted  . Type 2 diabetes, uncontrolled, with neuropathy 04/01/2008    Priority: High  . Diabetic neuropathy 12/07/2010    Priority: Medium  . HYPERLIPIDEMIA 02/12/2008    Priority: Medium  . ERECTILE DYSFUNCTION 02/12/2008    Priority: Medium  . HYPERTENSION 02/12/2008    Priority: Medium  . 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 Diverticulosis   . Colon polyps   . TOBACCO USE 05/19/2009   Past Medical History  Diagnosis Date  . Diabetes mellitus type II   . HLD (hyperlipidemia)   . HTN (hypertension)   . ED (erectile dysfunction)     rare  . Obesity   . Diverticulosis   . Colon polyps   . Diabetic neuropathy 12/07/2010   Past Surgical History  Procedure Laterality Date  . Appendectomy    . Inguinal hernia repair  1974  . Tonsillectomy    . Left hydrocele surgery  1974  . Testicle removal  1974    Left  . Replacement unicondylar joint knee  2009  . Vasectomy  1981  . Knee arthroscopy  1986    Right   History   Social History  . Marital Status: Married    Spouse Name: [name]  . Number of Children: [name]  . Years of Education: [name]   Occupational History  . General Contractor, [employer]    Social History Main Topics  . Smoking status: Current Some Day Smoker -- 0.75 packs/day for 30 years    Types: Cigarettes    Start date: 09/06/2012  .  Smokeless tobacco: Never Used     Comment: ? quit 2011  . Alcohol Use: 0.0 oz/week    0 Standard drinks or equivalent per week     Comment: Socially  . Drug Use: No  . Sexual Activity: Not on file   Other Topics Concern  . Not on file   Social History Narrative   Family History  Problem Relation Age of Onset  . Diabetes Father   . Diabetes Sister   . Mental illness Mother     ?   [name] Cancer Father     mouth   No Known Allergies  Medication list has been reviewed and updated.   General: Denies fever, chills, sweats. No significant weight loss. Eyes: Denies  blurring,significant itching ENT: Denies earache, sore throat, and hoarseness. Cardiovascular: Denies chest pains, palpitations, dyspnea on exertion Respiratory: Denies cough, dyspnea at rest,wheeezing Breast: no concerns about lumps GI: Denies nausea, vomiting, diarrhea, constipation, change in bowel habits, abdominal pain, melena, hematochezia GU: Denies penile discharge, + ED - meds help, No urinary flow / outflow problems. No STD concerns. Musculoskeletal: Denies back pain, joint pain Derm: Denies rash, itching Neuro: Denies  paresthesias, frequent falls, frequent headaches Psych: Denies depression, anxiety Endocrine: Denies cold intolerance, heat intolerance, polydipsia Heme: Denies enlarged lymph nodes Allergy: No hayfever  Objective:   BP 134/86 mmHg  Pulse 74  Temp(Src) 98.5 F (36.9 C) (Oral)  Ht 5' 11.25" (1.81 m)  Wt 206 lb 12 oz (93.781 kg)  BMI 28.63 kg/m2 Ideal Body Weight: Weight in (lb) to have BMI = 25: 180.1  No exam data present  GEN: well developed, well nourished, no acute distress Eyes: conjunctiva and lids normal, PERRLA, EOMI ENT: TM clear, nares clear, oral exam WNL Neck: supple, no lymphadenopathy, no thyromegaly, no JVD Pulm: clear to auscultation and percussion, respiratory effort normal CV: regular rate and rhythm, S1-S2, no murmur, rub or gallop, no bruits, peripheral pulses normal and symmetric, no cyanosis, clubbing, edema or varicosities GI: soft, non-tender; no hepatosplenomegaly, masses; active bowel sounds all quadrants GU: no hernia, testicular mass, penile discharge Lymph: no cervical, axillary or inguinal adenopathy MSK: gait normal, muscle tone and strength WNL, no joint swelling, effusions, discoloration, crepitus  SKIN: clear, good turgor, color WNL, no rashes, lesions, or ulcerations Neuro: normal mental status, normal strength, sensation, and motion Psych: alert; oriented to person, place and time, normally interactive and not  anxious or depressed in appearance.  All labs reviewed with patient.  Lipids:    Component Value Date/Time   CHOL 134 06/28/2014 0830   TRIG * 06/28/2014 0830    448.0 Triglyceride is over 400; calculations on Lipids are invalid.   HDL 27.70* 06/28/2014 0830   LDLDIRECT 47.0 06/28/2014 0830   VLDL 23.4 03/02/2013 1005   CHOLHDL 5 06/28/2014 0830   CBC: CBC Latest Ref Rng 06/28/2014 03/02/2013 10/22/2011  WBC 4.0 - 10.5 K/uL 9.7 10.4 7.6  Hemoglobin 13.0 - 17.0 g/dL 17.0 16.7 15.6  Hematocrit 39.0 - 52.0 % 51.0 51.3 47.3  Platelets 150.0 - 400.0 K/uL 226.0 202.0 875.6    Basic Metabolic Panel:    Component Value Date/Time   NA 136 06/28/2014 0830   K 4.3 06/28/2014 0830   CL 107 06/28/2014 0830   CO2 23 06/28/2014 0830   BUN 15 06/28/2014 0830   CREATININE 0.76 06/28/2014 0830   GLUCOSE 157* 06/28/2014 0830   CALCIUM 9.5 06/28/2014 0830   Hepatic Function Latest Ref Rng 06/28/2014 12/06/2010 04/04/2009  Total Protein 6.0 - 8.3 g/dL 6.7 7.5 7.4  Albumin 3.5 - 5.2 g/dL 4.3 4.7 4.3  AST 0 - 37 U/L _0 ALT 0 - 53 U/L _1 Alk Phosphatase 39 - 117 U/L 76 71 74  Total Bilirubin 0.2 - 1.2 mg/dL 0.4 1.0 0.8  Bilirubin, Direct 0.0 - 0.3 mg/dL 0.1 0.1 0.1    No results found for: TSH Lab Results  Component Value Date   PSA 0.77 06/28/2014   PSA 0.75 03/02/2013   PSA 0.56 12/06/2010    Assessment and Plan:   Healthcare maintenance  Type 2 diabetes, uncontrolled, with neuropathy  TOBACCO USE 5 minutes spent in counselling: The patient does smoke cigarettes, and we discussed that tobacco is harmful to one's overall health, and I made the recommendation to quit tobacco products.   Health Maintenance Exam: The patient's preventative maintenance and recommended screening tests for an annual wellness exam were reviewed in full today. Brought up to date unless services declined.  Counselled on the importance of diet, exercise, and its role in overall health and  mortality. The patient's FH and SH was reviewed, including their home life, tobacco status, and drug and alcohol status.  DM doing much better  Follow-up: 6 moDM Unless noted, follow-up in 1 year for Health Maintenance Exam.  New Prescriptions   No medications on file   No orders of the defined types were placed in this encounter.    Signed,  [name]. Dunya Meiners, MD   Patient's Medications  New Prescriptions   No medications on file  Previous Medications   ASPIRIN 81 MG TABLET    Take 81 mg by mouth daily.     ATORVASTATIN (LIPITOR) 20 MG TABLET    Take 1 tablet by mouth  daily   GLIPIZIDE XL 10 MG 24 HR TABLET    Take 2 tablets by mouth  daily with breakfast   GLUCOSE BLOOD (GLUCOMETER ELITE TEST STRIPS) TEST STRIP    1 each by Other route 2 (two) times daily. Use as instructed    LANCETS 28G MISC    by Does not apply route as directed.     METFORMIN (GLUCOPHAGE-XR) 500 MG 24 HR TABLET    Take 4 tablets by mouth  daily with breakfast   RAMIPRIL (ALTACE) 10 MG CAPSULE    Take 1 capsule (10 mg total) by mouth daily.   SAXAGLIPTIN HCL (ONGLYZA) 5 MG TABS TABLET    TAKE 1 BY MOUTH DAILY   VARDENAFIL (LEVITRA) 10 MG TABLET    Take 10 mg by mouth daily as needed. 30 minutes prior to intercourse   Modified Medications   No medications on file  Discontinued Medications   MULTIPLE VITAMIN (MULTIVITAMIN) TABLET    Take 1 tablet by mouth daily.

## 2014-07-05 NOTE — Progress Notes (Signed)
Pre visit review using our clinic review tool, if applicable. No additional management support is needed unless otherwise documented below in the visit note. 

## 2014-07-09 ENCOUNTER — Other Ambulatory Visit: Payer: Self-pay | Admitting: Family Medicine

## 2014-08-28 ENCOUNTER — Other Ambulatory Visit: Payer: Self-pay | Admitting: Family Medicine

## 2014-09-02 ENCOUNTER — Encounter: Payer: Self-pay | Admitting: Family Medicine

## 2014-09-02 ENCOUNTER — Ambulatory Visit (INDEPENDENT_AMBULATORY_CARE_PROVIDER_SITE_OTHER): Payer: 59 | Admitting: Family Medicine

## 2014-09-02 VITALS — BP 134/78 | HR 68 | Temp 98.5°F | Ht 71.25 in | Wt 216.8 lb

## 2014-09-02 DIAGNOSIS — R519 Headache, unspecified: Secondary | ICD-10-CM

## 2014-09-02 DIAGNOSIS — R220 Localized swelling, mass and lump, head: Secondary | ICD-10-CM | POA: Diagnosis not present

## 2014-09-02 DIAGNOSIS — R51 Headache: Secondary | ICD-10-CM

## 2014-09-02 DIAGNOSIS — H538 Other visual disturbances: Secondary | ICD-10-CM

## 2014-09-02 LAB — CBC WITH DIFFERENTIAL/PLATELET
Basophils Absolute: 0 10*3/uL (ref 0.0–0.1)
Basophils Relative: 0.4 % (ref 0.0–3.0)
EOS ABS: 0.2 10*3/uL (ref 0.0–0.7)
Eosinophils Relative: 2 % (ref 0.0–5.0)
HCT: 48.6 % (ref 39.0–52.0)
Hemoglobin: 16.3 g/dL (ref 13.0–17.0)
Lymphocytes Relative: 27.7 % (ref 12.0–46.0)
Lymphs Abs: 2.9 10*3/uL (ref 0.7–4.0)
MCHC: 33.5 g/dL (ref 30.0–36.0)
MCV: 97.8 fl (ref 78.0–100.0)
MONO ABS: 0.8 10*3/uL (ref 0.1–1.0)
Monocytes Relative: 7.5 % (ref 3.0–12.0)
Neutro Abs: 6.5 10*3/uL (ref 1.4–7.7)
Neutrophils Relative %: 62.4 % (ref 43.0–77.0)
Platelets: 225 10*3/uL (ref 150.0–400.0)
RBC: 4.97 Mil/uL (ref 4.22–5.81)
RDW: 13.6 % (ref 11.5–15.5)
WBC: 10.3 10*3/uL (ref 4.0–10.5)

## 2014-09-02 LAB — BASIC METABOLIC PANEL
BUN: 18 mg/dL (ref 6–23)
CALCIUM: 9.3 mg/dL (ref 8.4–10.5)
CO2: 29 mEq/L (ref 19–32)
CREATININE: 0.83 mg/dL (ref 0.40–1.50)
Chloride: 106 mEq/L (ref 96–112)
GFR: 99.64 mL/min (ref >60.00)
GLUCOSE: 164 mg/dL — AB (ref 70–99)
Potassium: 4.6 mEq/L (ref 3.5–5.1)
Sodium: 139 mEq/L (ref 135–145)

## 2014-09-02 LAB — HIGH SENSITIVITY CRP: CRP HIGH SENSITIVITY: 1.08 mg/L (ref 0.000–5.000)

## 2014-09-02 LAB — SEDIMENTATION RATE: Sed Rate: 6 mm/hr (ref 0–22)

## 2014-09-02 MED ORDER — AMOXICILLIN-POT CLAVULANATE 875-125 MG PO TABS: 1.0000 | ORAL_TABLET | Freq: Two times a day (BID) | ORAL | Status: DC

## 2014-09-02 NOTE — Progress Notes (Signed)
Dr. Frederico Hamman T. Cameo Shewell, MD, [facility] [place], [address] Phone: [phone] Fax: [phone]  09/02/2014  Patient: Connor Moore, MRN: [id number], DOB: 08/01/1950, 64 y.o.  Primary Physician:  [name], MD  Chief Complaint: Sensitivity on Head; Facial Swelling; and Watery Eye  Subjective:   LEVII HAIRFIELD is a 64 y.o. very pleasant male patient who presents with the following:  2 weeks ago, and noticed on the side of his head he has had a lot of sensitivity on the left side of his head. Left side of face - dentist called in some antibiotics.   Had a dental abscess / infected tooth.  Swelling went down and left nostril is swollen up some. Still is having sensitivity to his skull. Left-sided headache 2 weeks, the patient can point directly to where it is. He also has had some blurred vision on the left side as well. He has had no other neurological complaints and is thinking very clearly. He has not had any weakness or numbness anywhere in his body. Otherwise he feels essentially perfectly normal.  Was on Clinda 150 mg for 7 days, QID  Left blurred vision New onset L sided headache  Past Medical History, Surgical History, Social History, Family History, Problem List, Medications, and Allergies have been reviewed and updated if relevant.  Patient Active Problem List   Diagnosis Date Noted  . Type 2 diabetes, uncontrolled, with neuropathy 04/01/2008    Priority: High  . Diabetic neuropathy 12/07/2010    Priority: Medium  . HYPERLIPIDEMIA 02/12/2008    Priority: Medium  . ERECTILE DYSFUNCTION 02/12/2008    Priority: Medium  . HYPERTENSION 02/12/2008    Priority: Medium  . 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 Diverticulosis   . Colon polyps   . TOBACCO USE 05/19/2009    Past Medical History  Diagnosis Date  . Diabetes mellitus type II   . HLD (hyperlipidemia)     . HTN (hypertension)   . ED (erectile dysfunction)     rare  . Obesity   . Diverticulosis   . Colon polyps   . Diabetic neuropathy 12/07/2010    Past Surgical History  Procedure Laterality Date  . Appendectomy    . Inguinal hernia repair  1974  . Tonsillectomy    . Left hydrocele surgery  1974  . Testicle removal  1974    Left  . Replacement unicondylar joint knee  2009  . Vasectomy  1981  . Knee arthroscopy  1986    Right    Social History   Social History  . Marital Status: Married    Spouse Name: [name]  . Number of Children: [name]  . Years of Education: [name]   Occupational History  . General Contractor, [employer]    Social History Main Topics  . Smoking status: Current Some Day Smoker -- 0.75 packs/day for 30 years    Types: Cigarettes    Start date: 09/06/2012  . Smokeless tobacco: Never Used     Comment: ? quit 2011  . Alcohol Use: 0.0 oz/week    0 Standard drinks or equivalent per week     Comment: Socially  . Drug Use: No  . Sexual Activity: Not on file   Other Topics Concern  . Not on file   Social History Narrative    Family History  Problem Relation Age of Onset  . Diabetes Father   .  Diabetes Sister   . Mental illness Mother     ?   [name] Cancer Father     mouth    No Known Allergies  Medication list reviewed and updated in full in [facility].  ROS: GEN: Acute illness details above GI: Tolerating PO intake GU: maintaining adequate hydration and urination Pulm: No SOB Interactive and getting along well at home.  Otherwise, ROS is as per the HPI.   Objective:   BP 134/78 mmHg  Pulse 68  Temp(Src) 98.5 F (36.9 C) (Oral)  Ht 5' 11.25" (1.81 m)  Wt 216 lb 12 oz (98.317 kg)  BMI 30.01 kg/m2   GEN: WDWN, NAD, Non-toxic, A & O x 3 HEENT: Atraumatic, Normocephalic. Neck supple. No masses, No LAD. Left eye is somewhat puffy, anterior nares is more swollen on the left compared to the right. There is no frank redness or  warmth throughout any of this area. Sinuses NT Ears and Nose: No external deformity. TM clear bilaterally CV: RRR, No M/G/R. No JVD. No thrill. No extra heart sounds. PULM: CTA B, no wheezes, crackles, rhonchi. No retractions. No resp. distress. No accessory muscle use. EXTR: No c/c/e NEURO Normal gait.  PSYCH: Normally interactive. Conversant. Not depressed or anxious appearing.  Calm demeanor.     Laboratory and Imaging Data:  Assessment and Plan:   Left-sided headache - Plan: CBC with Differential/Platelet, High sensitivity CRP, Sedimentation rate, Basic metabolic panel  Facial swelling - Plan: CBC with Differential/Platelet, High sensitivity CRP, Sedimentation rate, Basic metabolic panel  Blurred vision, left eye  Differential is broad. Check sedimentation rate and CRP. Theoretically could be temporal arteritis. Neurological injury is also possible. Recent dental abscess. I am going to treat with Augmentin and reassess his clinical state after a few days.  New Prescriptions   AMOXICILLIN-CLAVULANATE (AUGMENTIN) 875-125 MG PER TABLET    Take 1 tablet by mouth 2 (two) times daily.   Orders Placed This Encounter  Procedures  . CBC with Differential/Platelet  . High sensitivity CRP  . Sedimentation rate  . Basic metabolic panel    Signed,  Frederico Hamman T. Malania Gawthrop, MD   Patient's Medications  New Prescriptions   AMOXICILLIN-CLAVULANATE (AUGMENTIN) 875-125 MG PER TABLET    Take 1 tablet by mouth 2 (two) times daily.  Previous Medications   ASPIRIN 81 MG TABLET    Take 81 mg by mouth daily.     ATORVASTATIN (LIPITOR) 20 MG TABLET    Take 1 tablet by mouth  daily   GLIPIZIDE (GLUCOTROL XL) 10 MG 24 HR TABLET    Take 2 tablets by mouth  daily with breakfast   GLUCOSE BLOOD (GLUCOMETER ELITE TEST STRIPS) TEST STRIP    1 each by Other route 2 (two) times daily. Use as instructed    LANCETS 28G MISC    by Does not apply route as directed.     METFORMIN (GLUCOPHAGE-XR) 500 MG 24 HR  TABLET    Take 4 tablets by mouth  daily with breakfast   ONGLYZA 5 MG TABS TABLET    Take 1 tablet by mouth  daily   RAMIPRIL (ALTACE) 10 MG CAPSULE    Take 1 capsule (10 mg total) by mouth daily.   VARDENAFIL (LEVITRA) 10 MG TABLET    Take 10 mg by mouth daily as needed. 30 minutes prior to intercourse   Modified Medications   No medications on file  Discontinued Medications   No medications on file

## 2014-09-02 NOTE — Progress Notes (Signed)
Pre visit review using our clinic review tool, if applicable. No additional management support is needed unless otherwise documented below in the visit note. 

## 2014-09-02 NOTE — Patient Instructions (Signed)
Call me on Wednesday if you are not doing better

## 2014-09-13 ENCOUNTER — Other Ambulatory Visit: Payer: Self-pay

## 2014-09-13 NOTE — Telephone Encounter (Signed)
pts wife said pt was seen 09/02/14 and pt had not been taking ramipril for 2 years; pt was not sure if Dr Lorelei Pont wanted him to restart ramipril to protect kidneys. Med list has notation on 09/02/14 that pt has not been taking med; and in reviewing 09/02/14 note do not see notation where pt was advised to restart med.Please advise.If pt should be taking ramipril [facility] and also 2 week supply to [facility] while waiting on mail order pharmacy order.Pt's wife request cb.

## 2014-09-13 NOTE — Telephone Encounter (Signed)
We had discussed him not restarting this. i am sorry if there was confusion. His BP is fine now. i would only follow it.

## 2014-09-13 NOTE — Telephone Encounter (Signed)
Helene Kelp (wife) notified as instructed by telephone. Refill for ramipril refused.

## 2015-01-19 ENCOUNTER — Ambulatory Visit: Payer: 59 | Admitting: Family Medicine

## 2015-01-19 ENCOUNTER — Other Ambulatory Visit (INDEPENDENT_AMBULATORY_CARE_PROVIDER_SITE_OTHER): Payer: 59

## 2015-01-19 DIAGNOSIS — E785 Hyperlipidemia, unspecified: Secondary | ICD-10-CM | POA: Diagnosis not present

## 2015-01-26 ENCOUNTER — Ambulatory Visit: Payer: 59 | Admitting: Family Medicine

## 2015-02-15 ENCOUNTER — Other Ambulatory Visit: Payer: Self-pay | Admitting: Family Medicine

## 2015-03-23 ENCOUNTER — Other Ambulatory Visit (INDEPENDENT_AMBULATORY_CARE_PROVIDER_SITE_OTHER): Payer: 59

## 2015-03-23 DIAGNOSIS — E119 Type 2 diabetes mellitus without complications: Secondary | ICD-10-CM

## 2015-03-23 DIAGNOSIS — E785 Hyperlipidemia, unspecified: Secondary | ICD-10-CM

## 2015-03-23 LAB — LIPID PANEL
Cholesterol: 125 mg/dL (ref 0–200)
HDL: 31.9 mg/dL — AB (ref >39.00)
NonHDL: 92.92
Total CHOL/HDL Ratio: 4
Triglycerides: 258 mg/dL — ABNORMAL HIGH (ref 0.0–149.0)
VLDL: 51.6 mg/dL — AB (ref 0.0–40.0)

## 2015-03-23 LAB — LDL CHOLESTEROL, DIRECT: LDL DIRECT: 53 mg/dL

## 2015-03-23 LAB — HEMOGLOBIN A1C: Hgb A1c MFr Bld: 8.4 % — ABNORMAL HIGH (ref 4.6–6.5)

## 2015-03-24 ENCOUNTER — Other Ambulatory Visit: Payer: 59

## 2015-03-31 ENCOUNTER — Ambulatory Visit (INDEPENDENT_AMBULATORY_CARE_PROVIDER_SITE_OTHER): Payer: 59 | Admitting: Family Medicine

## 2015-03-31 ENCOUNTER — Encounter: Payer: Self-pay | Admitting: Family Medicine

## 2015-03-31 VITALS — BP 140/90 | HR 86 | Temp 98.2°F | Ht 71.25 in | Wt 222.2 lb

## 2015-03-31 DIAGNOSIS — E1140 Type 2 diabetes mellitus with diabetic neuropathy, unspecified: Secondary | ICD-10-CM | POA: Diagnosis not present

## 2015-03-31 DIAGNOSIS — Z23 Encounter for immunization: Secondary | ICD-10-CM

## 2015-03-31 DIAGNOSIS — I10 Essential (primary) hypertension: Secondary | ICD-10-CM | POA: Diagnosis not present

## 2015-03-31 DIAGNOSIS — E785 Hyperlipidemia, unspecified: Secondary | ICD-10-CM | POA: Diagnosis not present

## 2015-03-31 DIAGNOSIS — IMO0002 Reserved for concepts with insufficient information to code with codable children: Secondary | ICD-10-CM

## 2015-03-31 DIAGNOSIS — F17200 Nicotine dependence, unspecified, uncomplicated: Secondary | ICD-10-CM

## 2015-03-31 DIAGNOSIS — E1165 Type 2 diabetes mellitus with hyperglycemia: Secondary | ICD-10-CM

## 2015-03-31 NOTE — Progress Notes (Signed)
Dr. Frederico Hamman T. Kyllie Pettijohn, MD, [name] [name], [address] Phone: [phone] Fax: [phone]  03/31/2015  Patient: Connor Moore, MRN: [id number], DOB: 1950/08/22, 65 y.o.  Primary Physician:  [name], MD   Chief Complaint  Patient presents with  . Follow-up    6 month   Subjective:   ROM Connor Moore is a 65 y.o. very pleasant male patient who presents with the following:  F/u DM   Diabetes Mellitus: Tolerating Medications: yes Compliance with diet: fair/poor Exercise: minimal / intermittent Avg blood sugars at home: not checking Foot problems: none Hypoglycemia: none No nausea, vomitting, blurred vision, polyuria.  Lab Results  Component Value Date   HGBA1C 8.4* 03/23/2015   HGBA1C 7.7* 06/28/2014   HGBA1C 8.1* 11/18/2013   Lab Results  Component Value Date   MICROALBUR 1.3 06/28/2014   LDLCALC 61 03/02/2013   CREATININE 0.83 09/02/2014    Wt Readings from Last 3 Encounters:  03/31/15 222 lb 4 oz (100.812 kg)  09/02/14 216 lb 12 oz (98.317 kg)  07/05/14 206 lb 12 oz (93.781 kg)    Body mass index is 30.77 kg/(m^2).   Lipids: Doing well, stable. Tolerating meds fine with no SE. Panel reviewed with patient.  Lipids:    Component Value Date/Time   CHOL 125 03/23/2015 0758   TRIG 258.0* 03/23/2015 0758   HDL 31.90* 03/23/2015 0758   LDLDIRECT 53.0 03/23/2015 0758   VLDL 51.6* 03/23/2015 0758   CHOLHDL 4 03/23/2015 0758    Lab Results  Component Value Date   ALT 12 06/28/2014   AST 13 06/28/2014   ALKPHOS 76 06/28/2014   BILITOT 0.4 06/28/2014    HTN: Tolerating all medications without side effects increased No CP, no sob. No HA.  BP Readings from Last 3 Encounters:  03/31/15 140/90  09/02/14 134/78  07/05/14 0000000    Basic Metabolic Panel:    Component Value Date/Time   NA 139 09/02/2014 1143   K 4.6 09/02/2014 1143   CL 106 09/02/2014 1143   CO2 29  09/02/2014 1143   BUN 18 09/02/2014 1143   CREATININE 0.83 09/02/2014 1143   GLUCOSE 164* 09/02/2014 1143   CALCIUM 9.3 09/02/2014 1143      Update tdap.  Wants to lose 20 points Smoking.  Past Medical History, Surgical History, Social History, Family History, Problem List, Medications, and Allergies have been reviewed and updated if relevant.  Patient Active Problem List   Diagnosis Date Noted  . Type 2 diabetes, uncontrolled, with neuropathy ([place]) 04/01/2008    Priority: High  . Diabetic neuropathy ([place]) 12/07/2010    Priority: Medium  . HYPERLIPIDEMIA 02/12/2008    Priority: Medium  . ERECTILE DYSFUNCTION 02/12/2008    Priority: Medium  . HYPERTENSION 02/12/2008    Priority: Medium  . 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 Diverticulosis   . Colon polyps   . TOBACCO USE 05/19/2009    Past Medical History  Diagnosis Date  . Diabetes mellitus type II   . HLD (hyperlipidemia)   . HTN (hypertension)   . ED (erectile dysfunction)     rare  . Obesity   . Diverticulosis   . Colon polyps   . Diabetic neuropathy ([name]) 12/07/2010    Past Surgical History  Procedure Laterality Date  . Appendectomy    . Inguinal hernia repair  1974  . Tonsillectomy    . Left  hydrocele surgery  1974  . Testicle removal  1974    Left  . Replacement unicondylar joint knee  2009  . Vasectomy  1981  . Knee arthroscopy  1986    Right    Social History   Social History  . Marital Status: Married    Spouse Name: [name]  . Number of Children: [name]  . Years of Education: [name]   Occupational History  . General Contractor, [employer]    Social History Main Topics  . Smoking status: Current Some Day Smoker -- 0.75 packs/day for 30 years    Types: Cigarettes    Start date: 09/06/2012  . Smokeless tobacco: Never Used     Comment: ? quit 2011  . Alcohol Use: 0.0 oz/week    0 Standard drinks or equivalent per week     Comment: Socially  . Drug  Use: No  . Sexual Activity: Not on file   Other Topics Concern  . Not on file   Social History Narrative    Family History  Problem Relation Age of Onset  . Diabetes Father   . Diabetes Sister   . Mental illness Mother     ?   [name] Cancer Father     mouth    No Known Allergies  Medication list reviewed and updated in full in [facility].   GEN: No acute illnesses, no fevers, chills. GI: No n/v/d, eating normally Pulm: No SOB Interactive and getting along well at home.  Otherwise, ROS is as per the HPI.  Objective:   BP 140/90 mmHg  Pulse 86  Temp(Src) 98.2 F (36.8 C) (Oral)  Ht 5' 11.25" (1.81 m)  Wt 222 lb 4 oz (100.812 kg)  BMI 30.77 kg/m2  GEN: WDWN, NAD, Non-toxic, A & O x 3 HEENT: Atraumatic, Normocephalic. Neck supple. No masses, No LAD. Ears and Nose: No external deformity. CV: RRR, No M/G/R. No JVD. No thrill. No extra heart sounds. PULM: CTA B, no wheezes, crackles, rhonchi. No retractions. No resp. distress. No accessory muscle use. EXTR: No c/c/e NEURO Normal gait.  PSYCH: Normally interactive. Conversant. Not depressed or anxious appearing.  Calm demeanor.   Laboratory and Imaging Data:  Assessment and Plan:   Type 2 diabetes, uncontrolled, with neuropathy (Connor Moore)  Need for prophylactic vaccination with combined diphtheria-tetanus-pertussis (DTP) vaccine - Plan: Tdap vaccine greater than or equal to 7yo IM  Essential hypertension  Hyperlipidemia  TOBACCO USE  All deteriorated.  He is determined to lose 20 pounds and quit smoking. Reasonable to allow prior to med changes.  Follow-up: Return in about 6 months (around 10/01/2015) for Complete physical Exam.  Orders Placed This Encounter  Procedures  . Tdap vaccine greater than or equal to 7yo IM    Signed,  Nyisha Clippard T. Antwine Agosto, MD   Patient's Medications  New Prescriptions   No medications on file  Previous Medications   ASPIRIN 81 MG TABLET    Take 81 mg by mouth daily.      ATORVASTATIN (LIPITOR) 20 MG TABLET    Take 1 tablet by mouth  daily   GLIPIZIDE (GLUCOTROL XL) 10 MG 24 HR TABLET    Take 2 tablets by mouth  daily with breakfast   GLUCOSE BLOOD (GLUCOMETER ELITE TEST STRIPS) TEST STRIP    1 each by Other route 2 (two) times daily. Use as instructed    LANCETS 28G MISC    by Does not apply route as directed.  METFORMIN (GLUCOPHAGE-XR) 500 MG 24 HR TABLET    Take 4 tablets by mouth  daily with breakfast   ONGLYZA 5 MG TABS TABLET    Take 1 tablet by mouth  daily   VARDENAFIL (LEVITRA) 10 MG TABLET    Take 10 mg by mouth daily as needed. 30 minutes prior to intercourse   Modified Medications   No medications on file  Discontinued Medications   AMOXICILLIN-CLAVULANATE (AUGMENTIN) 875-125 MG PER TABLET    Take 1 tablet by mouth 2 (two) times daily.

## 2015-03-31 NOTE — Progress Notes (Signed)
Pre visit review using our clinic review tool, if applicable. No additional management support is needed unless otherwise documented below in the visit note. 

## 2015-06-07 ENCOUNTER — Other Ambulatory Visit: Payer: Self-pay | Admitting: Family Medicine

## 2015-06-23 ENCOUNTER — Encounter: Payer: Self-pay | Admitting: Family Medicine

## 2015-06-23 ENCOUNTER — Ambulatory Visit (INDEPENDENT_AMBULATORY_CARE_PROVIDER_SITE_OTHER): Payer: 59 | Admitting: Family Medicine

## 2015-06-23 VITALS — BP 128/74 | HR 79 | Temp 98.7°F | Ht 71.25 in | Wt 215.0 lb

## 2015-06-23 DIAGNOSIS — K130 Diseases of lips: Secondary | ICD-10-CM | POA: Diagnosis not present

## 2015-06-23 DIAGNOSIS — E1169 Type 2 diabetes mellitus with other specified complication: Secondary | ICD-10-CM

## 2015-06-23 DIAGNOSIS — N521 Erectile dysfunction due to diseases classified elsewhere: Secondary | ICD-10-CM

## 2015-06-23 DIAGNOSIS — J208 Acute bronchitis due to other specified organisms: Secondary | ICD-10-CM | POA: Diagnosis not present

## 2015-06-23 DIAGNOSIS — Z720 Tobacco use: Secondary | ICD-10-CM | POA: Diagnosis not present

## 2015-06-23 MED ORDER — AZITHROMYCIN 250 MG PO TABS: ORAL_TABLET | ORAL | Status: DC

## 2015-06-23 MED ORDER — DOXYCYCLINE HYCLATE 100 MG PO TABS: 100.0000 mg | ORAL_TABLET | Freq: Two times a day (BID) | ORAL | Status: DC

## 2015-06-23 MED ORDER — SILDENAFIL CITRATE 20 MG PO TABS: ORAL_TABLET | ORAL | Status: DC

## 2015-06-23 NOTE — Progress Notes (Signed)
Dr. Frederico Hamman T. Savier Trickett, MD, [facility] [place], [address] Phone: [phone] Fax: [phone]  06/23/2015  Patient: Connor Moore, MRN: [id number], DOB: [birth year], 65 y.o.  Primary Physician:  [name], MD   Chief Complaint  Patient presents with  . Cough    with green phelgm   Subjective:   Connor Moore is a 65 y.o. very pleasant male patient who presents with the following:  Acute bronchitis: he really hasn't been feeling well all week and initially had some cold-like symptoms, and this is progressed to restricted breathing in his chest and a productive cough of yellowish-green sputum.  He has a pulse ox of 92% today.  He has been a longtime smoker for about 45 years.  He quit for a short time a few years ago.  He is currently afebrile unable to eat and drink okay.  He also has a lip lesion is been there for about 6 months.  Thinks he probably bit this area, but is also the area where he rests his cigarette when he is smoking. It has not healed.  Dad with history oral cancer  He also continues to have some issues with erectile dysfunction.  He has tried Viagra, Cialis, and Levitra.  He thinks that he had the best response from Viagra, but that is been many years ago.  Past Medical History, Surgical History, Social History, Family History, Problem List, Medications, and Allergies have been reviewed and updated if relevant.  Patient Active Problem List   Diagnosis Date Noted  . Type 2 diabetes, uncontrolled, with neuropathy ([place]) 04/01/2008    Priority: High  . Diabetic neuropathy ([place]) 12/07/2010    Priority: Medium  . Hyperlipidemia 02/12/2008    Priority: Medium  . ERECTILE DYSFUNCTION 02/12/2008    Priority: Medium  . Essential hypertension 02/12/2008    Priority: Medium  . 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 Diverticulosis   . Colon polyps   . TOBACCO  USE 05/19/2009    Past Medical History  Diagnosis Date  . Diabetes mellitus type II   . HLD (hyperlipidemia)   . HTN (hypertension)   . ED (erectile dysfunction)     rare  . Obesity   . Diverticulosis   . Colon polyps   . Diabetic neuropathy ([place]) 12/07/2010    Past Surgical History  Procedure Laterality Date  . Appendectomy    . Inguinal hernia repair  1974  . Tonsillectomy    . Left hydrocele surgery  1974  . Testicle removal  1974    Left  . Replacement unicondylar joint knee  2009  . Vasectomy  1981  . Knee arthroscopy  1986    Right    Social History   Social History  . Marital Status: Married    Spouse Name: [name]  . Number of Children: [name]  . Years of Education: [name]   Occupational History  . General Contractor, [employer]    Social History Main Topics  . Smoking status: Current Some Day Smoker -- 0.75 packs/day for 30 years    Types: Cigarettes    Start date: 09/06/2012  . Smokeless tobacco: Never Used     Comment: ? quit 2011  . Alcohol Use: 0.0 oz/week    0 Standard drinks or equivalent per week     Comment: Socially  . Drug Use: No  . Sexual Activity: Not on file  Other Topics Concern  . Not on file   Social History Narrative    Family History  Problem Relation Age of Onset  . Diabetes Father   . Diabetes Sister   . Mental illness Mother     ?   [name] Cancer Father     mouth    No Known Allergies  Medication list reviewed and updated in full in [place].  ROS: GEN: Acute illness details above GI: Tolerating PO intake GU: maintaining adequate hydration and urination Pulm: No SOB Interactive and getting along well at home.  Otherwise, ROS is as per the HPI.  Objective:   BP 128/74 mmHg  Pulse 79  Temp(Src) 98.7 F (37.1 C) (Oral)  Ht 5' 11.25" (1.81 m)  Wt 215 lb (97.523 kg)  BMI 29.77 kg/m2  SpO2 92%   GEN: A and O x 3. WDWN. NAD.    ENT: Nose clear, ext NML.  No LAD.  No JVD.  TM's clear. Oropharynx  clear.   On his lower lip  On the inside portion there is a darkened area that is flat, but has been present for 6 months.  PULM: Normal WOB, no distress. No crackles, wheezes, rare rhonchi. CV: RRR, no M/G/R, No rubs, No JVD.   EXT: warm and well-perfused, No c/c/e. PSYCH: Pleasant and conversant.    Laboratory and Imaging Data:  Assessment and Plan:   Acute bronchitis due to other specified organisms  Lip lesion - Plan: Ambulatory referral to ENT  Tobacco abuse  Erectile dysfunction associated with type 2 diabetes mellitus ([place])  Acute bronchitis versus atypical pneumonia.  We will treat this as such.  Pulse ox 92%.  Long-standing smoker.  Concern for potential underlying COPD. He comes to the office and he is well, spirometry would be a good idea.  He has declined basically all vaccines  The entire time I have known him.  Lip lesion is concerning.  Longtime smoker.  Father with oral cancer.  Given its location, I think that this will be best addressed by ENT.  I appreciate that there  Assistance with this assessment.  Follow-up: 3 mo  New Prescriptions   DOXYCYCLINE (VIBRA-TABS) 100 MG TABLET    Take 1 tablet (100 mg total) by mouth 2 (two) times daily.   Orders Placed This Encounter  Procedures  . Ambulatory referral to ENT    Signed,  Frederico Hamman T. Norval Slaven, MD   Patient's Medications  New Prescriptions   DOXYCYCLINE (VIBRA-TABS) 100 MG TABLET    Take 1 tablet (100 mg total) by mouth 2 (two) times daily.  Previous Medications   ASPIRIN 81 MG TABLET    Take 81 mg by mouth daily.     ATORVASTATIN (LIPITOR) 20 MG TABLET    Take 1 tablet by mouth  daily   GLIPIZIDE (GLUCOTROL XL) 10 MG 24 HR TABLET    Take 2 tablets by mouth  daily with breakfast   GLUCOSE BLOOD (GLUCOMETER ELITE TEST STRIPS) TEST STRIP    1 each by Other route 2 (two) times daily. Use as instructed    LANCETS 28G MISC    by Does not apply route as directed.     METFORMIN (GLUCOPHAGE-XR) 500 MG 24 HR  TABLET    Take 4 tablets by mouth  daily with breakfast   ONGLYZA 5 MG TABS TABLET    Take 1 tablet by mouth  daily   VARDENAFIL (LEVITRA) 10 MG TABLET    Take 10 mg  by mouth daily as needed. 30 minutes prior to intercourse   Modified Medications   No medications on file  Discontinued Medications   No medications on file

## 2015-06-23 NOTE — Progress Notes (Signed)
Pre visit review using our clinic review tool, if applicable. No additional management support is needed unless otherwise documented below in the visit note. 

## 2015-06-29 ENCOUNTER — Telehealth: Payer: Self-pay

## 2015-06-29 MED ORDER — LEVOFLOXACIN 500 MG PO TABS: 500.0000 mg | ORAL_TABLET | Freq: Every day | ORAL | Status: DC

## 2015-06-29 MED ORDER — PREDNISONE 20 MG PO TABS: ORAL_TABLET | ORAL | Status: DC

## 2015-06-29 NOTE — Telephone Encounter (Signed)
I spoke with Helene Kelp (DPR signed); pt was seen 06/23/15 and pt started abx on 06/23/15; now temp 99.4,productive cough with yellow phlegm,SOB after coughing.No wheezing. Pt feels worse than when seen on 06/23/15; Helene Kelp said pt does not want to come back in for appt but request different abx to [facility]. Helene Kelp request cb.

## 2015-06-29 NOTE — Telephone Encounter (Signed)
Left message for Helene Kelp that prescriptions for Levaquin and Prednisone has been sent to [facility] for Mr. Manigault.  Advised to discontinue Doxycycline.

## 2015-06-29 NOTE — Telephone Encounter (Signed)
PLEASE NOTE: All timestamps contained within this report are represented as [place] Standard Time. CONFIDENTIALTY NOTICE: This fax transmission is intended only for the addressee. It contains information that is legally privileged, confidential or otherwise protected from use or disclosure. If you are not the intended recipient, you are strictly prohibited from reviewing, disclosing, copying using or disseminating any of this information or taking any action in reliance on or regarding this information. If you have received this fax in error, please notify us immediately by telephone so that we can arrange for its return to [place]. Phone: [phone], Toll-Free: [phone], Fax: [phone] Page: 1 of 2 Call Id: [id number] [place] Patient Name: Connor Moore Gender: Male DOB: 12-06-1950 Age: 65 Y 2 M 28 D Return Phone Number: [phone] (Primary) Address: City/State/Zip: [place] Client [place] Night - Client Client Site [place] [facility] Physician Copland, Frederico Hamman - MD Contact Type Call Who Is Calling Patient / Member / Family / Caregiver Call Type Triage / Clinical Caller Name Josedejesus Asada Relationship To Patient Spouse Return Phone Number [phone] (Primary) Chief Complaint CHEST PAIN (>=21 years) - pain, pressure, heaviness or tightness Reason for Call Symptomatic / Request for [place] states husband was diagnosed with bronchitis and was put on an antibiotic, but the antibiotic is not helping. He has a bad cough right now and has tightness in his chest. PreDisposition Call Doctor Translation No Nurse Assessment Nurse: Wynetta Emery, RN, Baker Janus Date/Time Eilene Ghazi Time): 06/28/2015 5:04:47 PM Confirm and document reason for call. If symptomatic, describe symptoms. You must click the next button to save  text entered. ---Gayle was diagnosed with bronchitis last Thursday given antibiotic and it is not helping continues to cough and not getting any better getting worse. Has the patient traveled out of the country within the last 30 days? ---No Does the patient have any new or worsening symptoms? ---Yes Will a triage be completed? ---Yes Related visit to physician within the last 2 weeks? ---Yes Does the PT have any chronic conditions? (i.e. diabetes, asthma, etc.) ---Unknown Is this a behavioral health or substance abuse call? ---No Guidelines Guideline Title Affirmed Question Affirmed Notes Nurse Date/Time (Eastern Time) Cough - Acute Productive Cough present > 10 days Ivin Booty 06/28/2015 5:07:04 PM Disp. Time Eilene Ghazi Time) Disposition Final User 06/28/2015 5:01:08 PM Send to Urgent Queue Honor Loh 06/28/2015 5:09:46 PM See PCP When Office is Open (within 3 days) Yes Wynetta Emery, RN, Baker Janus PLEASE NOTE: All timestamps contained within this report are represented as [place] Standard Time. CONFIDENTIALTY NOTICE: This fax transmission is intended only for the addressee. It contains information that is legally privileged, confidential or otherwise protected from use or disclosure. If you are not the intended recipient, you are strictly prohibited from reviewing, disclosing, copying using or disseminating any of this information or taking any action in reliance on or regarding this information. If you have received this fax in error, please notify us immediately by telephone so that we can arrange for its return to [place]. Phone: [phone], Toll-Free: [phone], Fax: [phone] Page: 2 of 2 Call Id: [phone] Caller Understands: Yes Disagree/Comply: Comply Care Advice Given Per Guideline SEE PCP WITHIN 3 DAYS: - OTC COUGH SYRUPS: The most common cough suppressant in OTC cough medications is dextromethorphan. Often the letters 'DM' appear in the name. - OTC COUGH DROPS:  Cough drops can help a lot, especially  for mild coughs. They reduce coughing by soothing your irritated throat and removing that tickle sensation in the back of the throat. Cough drops also have the advantage of portability - you can carry them with you. - HOME REMEDY - HARD CANDY: Hard candy works just as well as medicine-flavored OTC cough drops. Diabetics should use sugar-free candy. HUMIDIFIER: If the air is dry, use a humidifier in the bedroom. (Reason: dry air makes coughs worse) * Fever over 103 F (39.4 C) * Difficulty breathing occurs * You become worse. CARE ADVICE given per Cough - Acute Productive (Adult) guideline. Comments User: Michele Rockers, RN Date/Time Eilene Ghazi Time): 06/28/2015 5:14:37 PM Nurse called back line and Shirlean Mylar asked that this be done tomorrow since not an emergency

## 2015-06-29 NOTE — Telephone Encounter (Signed)
Some of this may be coming from his lungs / changes from long-term smoking.  D/c doxycycline  Levaquin 500 mg, 1 po daily x 10 days  Prednisone 20 mg, 2 tabs po for 5 days, then 1 tab po for 5 days, #15, 0 ref

## 2015-07-25 ENCOUNTER — Ambulatory Visit (INDEPENDENT_AMBULATORY_CARE_PROVIDER_SITE_OTHER): Payer: 59 | Admitting: Family Medicine

## 2015-07-25 ENCOUNTER — Encounter: Payer: Self-pay | Admitting: Family Medicine

## 2015-07-25 ENCOUNTER — Ambulatory Visit (INDEPENDENT_AMBULATORY_CARE_PROVIDER_SITE_OTHER)
Admission: RE | Admit: 2015-07-25 | Discharge: 2015-07-25 | Disposition: A | Discharge status: Home / Self Care | Payer: 59 | Source: Ambulatory Visit | Attending: Family Medicine | Admitting: Family Medicine

## 2015-07-25 VITALS — BP 140/90 | HR 69 | Temp 98.4°F | Ht 71.25 in | Wt 210.5 lb

## 2015-07-25 DIAGNOSIS — J208 Acute bronchitis due to other specified organisms: Secondary | ICD-10-CM | POA: Diagnosis not present

## 2015-07-25 DIAGNOSIS — Z720 Tobacco use: Secondary | ICD-10-CM

## 2015-07-25 DIAGNOSIS — R05 Cough: Secondary | ICD-10-CM

## 2015-07-25 DIAGNOSIS — R059 Cough, unspecified: Secondary | ICD-10-CM

## 2015-07-25 MED ORDER — ALBUTEROL SULFATE HFA 108 (90 BASE) MCG/ACT IN AERS: 2.0000 | INHALATION_SPRAY | RESPIRATORY_TRACT | Status: DC | PRN

## 2015-07-25 MED ORDER — AMOXICILLIN-POT CLAVULANATE 875-125 MG PO TABS: 1.0000 | ORAL_TABLET | Freq: Two times a day (BID) | ORAL | Status: DC

## 2015-07-25 MED ORDER — PREDNISONE 20 MG PO TABS: 40.0000 mg | ORAL_TABLET | Freq: Every day | ORAL | Status: DC

## 2015-07-25 NOTE — Progress Notes (Signed)
Pre visit review using our clinic review tool, if applicable. No additional management support is needed unless otherwise documented below in the visit note. 

## 2015-07-26 NOTE — Progress Notes (Signed)
Dr. Frederico Hamman T. Izeah Vossler, MD, [facility] [place], [address] Phone: [phone] Fax: [phone]  07/25/2015  Patient: Connor Moore, MRN: [id number], DOB: [birth year], 65 y.o.  Primary Physician:  [name], MD   Chief Complaint  Patient presents with  . Cough   Subjective:   Connor Moore is a 65 y.o. very pleasant male patient who presents with the following:  His cough and difficulty with breathing has persisted.  He is finished a course of prednisone which made him feel quite a bit better, and he also has finished 10 days of Levaquin.  He stopped smoking 18 days ago.  He is had a significant persistent cough and generally still does not feel well.  He is still afebrile.  He does not use the inhaler.  06/23/2015 Last OV with [name], MD  Acute bronchitis: he really hasn't been feeling well all week and initially had some cold-like symptoms, and this is progressed to restricted breathing in his chest and a productive cough of yellowish-green sputum.  He has a pulse ox of 92% today.  He has been a longtime smoker for about 45 years.  He quit for a short time a few years ago.  He is currently afebrile unable to eat and drink okay.  He also has a lip lesion is been there for about 6 months.  Thinks he probably bit this area, but is also the area where he rests his cigarette when he is smoking. It has not healed.  Dad with history oral cancer  He also continues to have some issues with erectile dysfunction.  He has tried Viagra, Cialis, and Levitra.  He thinks that he had the best response from Viagra, but that is been many years ago.  Past Medical History, Surgical History, Social History, Family History, Problem List, Medications, and Allergies have been reviewed and updated if relevant.  Patient Active Problem List   Diagnosis Date Noted  . Type 2 diabetes, uncontrolled, with neuropathy ([place])  04/01/2008    Priority: High  . Diabetic neuropathy ([place]) 12/07/2010    Priority: Medium  . Hyperlipidemia 02/12/2008    Priority: Medium  . Erectile dysfunction associated with type 2 diabetes mellitus ([place]) 02/12/2008    Priority: Medium  . Essential hypertension 02/12/2008    Priority: Medium  . 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 Diverticulosis   . Colon polyps   . TOBACCO USE 05/19/2009    Past Medical History  Diagnosis Date  . Diabetes mellitus type II   . HLD (hyperlipidemia)   . HTN (hypertension)   . ED (erectile dysfunction)     rare  . Obesity   . Diverticulosis   . Colon polyps   . Diabetic neuropathy ([place]) 12/07/2010    Past Surgical History  Procedure Laterality Date  . Appendectomy    . Inguinal hernia repair  1974  . Tonsillectomy    . Left hydrocele surgery  1974  . Testicle removal  1974    Left  . Replacement unicondylar joint knee  2009  . Vasectomy  1981  . Knee arthroscopy  1986    Right    Social History   Social History  . Marital Status: Married    Spouse Name: [name]  . Number of Children: [name]  . Years of Education: [name]   Occupational History  . General Contractor, [employer]  Social History Main Topics  . Smoking status: Former Smoker -- 0.75 packs/day for 30 years    Types: Cigarettes    Start date: 07/14/2015  . Smokeless tobacco: Never Used     Comment: ? quit 2011  . Alcohol Use: 0.0 oz/week    0 Standard drinks or equivalent per week     Comment: Socially  . Drug Use: No  . Sexual Activity: Not on file   Other Topics Concern  . Not on file   Social History Narrative    Family History  Problem Relation Age of Onset  . Diabetes Father   . Diabetes Sister   . Mental illness Mother     ?   [name] Cancer Father     mouth    No Known Allergies  Medication list reviewed and updated in full in [facility].  ROS: GEN: Acute illness details above GI: Tolerating PO  intake GU: maintaining adequate hydration and urination Pulm: + SOB Interactive and getting along well at home.  Otherwise, ROS is as per the HPI.  Objective:   BP 140/90 mmHg  Pulse 69  Temp(Src) 98.4 F (36.9 C) (Oral)  Ht 5' 11.25" (1.81 m)  Wt 210 lb 8 oz (95.482 kg)  BMI 29.15 kg/m2  SpO2 95%   GEN: A and O x 3. WDWN. NAD.    ENT: Nose clear, ext NML.  No LAD.  No JVD.  TM's clear. Oropharynx clear.  PULM: Normal WOB, no distress. rhonchorous sounds and intermittent wheezing in both lung fields. CV: RRR, no M/G/R, No rubs, No JVD.   EXT: warm and well-perfused, No c/c/e. PSYCH: Pleasant and conversant.    Laboratory and Imaging Data: Dg Chest 2 View  07/25/2015  CLINICAL DATA:  Cough for 1 month EXAM: CHEST  2 VIEW COMPARISON:  None. FINDINGS: Heart and mediastinal contours are within normal limits. No focal opacities or effusions. No acute bony abnormality. IMPRESSION: No active cardiopulmonary disease. Electronically Signed   By: Rolm Baptise M.D.   On: 07/25/2015 12:20     Assessment and Plan:   Cough - Plan: DG Chest 2 View  Acute bronchitis due to other specified organisms  Tobacco abuse  He continues to not do well.  He did do better when I placed him on prednisone previously, and he is also finished a course of Levaquin.  He continues to have trouble breathing, shortness of breath, and is coughing all the time.  40 years of smoking, COPD is suspected.   I will treat him with some prednisone as well as an albuterol.  Add Augmentin for different coverage.  Follow-up: 3 mo  New Prescriptions   ALBUTEROL (PROVENTIL HFA;VENTOLIN HFA) 108 (90 BASE) MCG/ACT INHALER    Inhale 2 puffs into the lungs every 4 (four) hours as needed for wheezing or shortness of breath.   AMOXICILLIN-CLAVULANATE (AUGMENTIN) 875-125 MG TABLET    Take 1 tablet by mouth 2 (two) times daily.   PREDNISONE (DELTASONE) 20 MG TABLET    Take 2 tablets (40 mg total) by mouth daily with breakfast.    Orders Placed This Encounter  Procedures  . DG Chest 2 View    Signed,  Frederico Hamman T. Jeoffrey Eleazer, MD   Patient's Medications  New Prescriptions   ALBUTEROL (PROVENTIL HFA;VENTOLIN HFA) 108 (90 BASE) MCG/ACT INHALER    Inhale 2 puffs into the lungs every 4 (four) hours as needed for wheezing or shortness of breath.   AMOXICILLIN-CLAVULANATE (AUGMENTIN) 875-125 MG  TABLET    Take 1 tablet by mouth 2 (two) times daily.   PREDNISONE (DELTASONE) 20 MG TABLET    Take 2 tablets (40 mg total) by mouth daily with breakfast.  Previous Medications   ASPIRIN 81 MG TABLET    Take 81 mg by mouth daily.     ATORVASTATIN (LIPITOR) 20 MG TABLET    Take 1 tablet by mouth  daily   GLIPIZIDE (GLUCOTROL XL) 10 MG 24 HR TABLET    Take 2 tablets by mouth  daily with breakfast   GLUCOSE BLOOD (GLUCOMETER ELITE TEST STRIPS) TEST STRIP    1 each by Other route 2 (two) times daily. Use as instructed    LANCETS 28G MISC    by Does not apply route as directed.     METFORMIN (GLUCOPHAGE-XR) 500 MG 24 HR TABLET    Take 4 tablets by mouth  daily with breakfast   ONGLYZA 5 MG TABS TABLET    Take 1 tablet by mouth  daily   VARDENAFIL (LEVITRA) 10 MG TABLET    Take 10 mg by mouth daily as needed. 30 minutes prior to intercourse   Modified Medications   No medications on file  Discontinued Medications   LEVOFLOXACIN (LEVAQUIN) 500 MG TABLET    Take 1 tablet (500 mg total) by mouth daily.   PREDNISONE (DELTASONE) 20 MG TABLET    Take 2 tablets by mouth x 5 days, then 1 tablet by mouth x 5 days.

## 2015-08-30 ENCOUNTER — Other Ambulatory Visit: Payer: Self-pay | Admitting: Family Medicine

## 2015-10-01 ENCOUNTER — Other Ambulatory Visit: Payer: Self-pay | Admitting: Family Medicine

## 2015-10-03 ENCOUNTER — Ambulatory Visit: Payer: 59 | Admitting: Family Medicine

## 2015-10-17 ENCOUNTER — Other Ambulatory Visit: Payer: Self-pay | Admitting: Family Medicine

## 2015-10-17 DIAGNOSIS — Z79899 Other long term (current) drug therapy: Secondary | ICD-10-CM

## 2015-10-17 DIAGNOSIS — E119 Type 2 diabetes mellitus without complications: Secondary | ICD-10-CM

## 2015-10-17 DIAGNOSIS — Z114 Encounter for screening for human immunodeficiency virus [HIV]: Secondary | ICD-10-CM

## 2015-10-17 DIAGNOSIS — E7849 Other hyperlipidemia: Secondary | ICD-10-CM

## 2015-10-17 DIAGNOSIS — Z1159 Encounter for screening for other viral diseases: Secondary | ICD-10-CM

## 2015-10-17 DIAGNOSIS — Z125 Encounter for screening for malignant neoplasm of prostate: Secondary | ICD-10-CM

## 2015-10-20 ENCOUNTER — Other Ambulatory Visit (INDEPENDENT_AMBULATORY_CARE_PROVIDER_SITE_OTHER): Payer: 59

## 2015-10-20 DIAGNOSIS — E784 Other hyperlipidemia: Secondary | ICD-10-CM

## 2015-10-20 DIAGNOSIS — E119 Type 2 diabetes mellitus without complications: Secondary | ICD-10-CM

## 2015-10-20 DIAGNOSIS — Z1159 Encounter for screening for other viral diseases: Secondary | ICD-10-CM

## 2015-10-20 DIAGNOSIS — E7849 Other hyperlipidemia: Secondary | ICD-10-CM

## 2015-10-20 DIAGNOSIS — Z125 Encounter for screening for malignant neoplasm of prostate: Secondary | ICD-10-CM | POA: Diagnosis not present

## 2015-10-20 DIAGNOSIS — Z114 Encounter for screening for human immunodeficiency virus [HIV]: Secondary | ICD-10-CM

## 2015-10-20 DIAGNOSIS — Z79899 Other long term (current) drug therapy: Secondary | ICD-10-CM | POA: Diagnosis not present

## 2015-10-20 LAB — LIPID PANEL
CHOLESTEROL: 152 mg/dL (ref 0–200)
HDL: 35.8 mg/dL — ABNORMAL LOW (ref >39.00)
NonHDL: 116.26
Total CHOL/HDL Ratio: 4
Triglycerides: 254 mg/dL — ABNORMAL HIGH (ref 0.0–149.0)
VLDL: 50.8 mg/dL — ABNORMAL HIGH (ref 0.0–40.0)

## 2015-10-20 LAB — HEPATIC FUNCTION PANEL
ALK PHOS: 82 U/L (ref 39–117)
ALT: 15 U/L (ref 0–53)
AST: 11 U/L (ref 0–37)
Albumin: 4.1 g/dL (ref 3.5–5.2)
BILIRUBIN TOTAL: 0.7 mg/dL (ref 0.2–1.2)
Bilirubin, Direct: 0.1 mg/dL (ref 0.0–0.3)
Total Protein: 6.8 g/dL (ref 6.0–8.3)

## 2015-10-20 LAB — BASIC METABOLIC PANEL
BUN: 19 mg/dL (ref 6–23)
CALCIUM: 9.2 mg/dL (ref 8.4–10.5)
CO2: 28 meq/L (ref 19–32)
CREATININE: 0.82 mg/dL (ref 0.40–1.50)
Chloride: 103 mEq/L (ref 96–112)
GFR: 100.68 mL/min (ref >60.00)
GLUCOSE: 274 mg/dL — AB (ref 70–99)
Potassium: 4.6 mEq/L (ref 3.5–5.1)
SODIUM: 139 meq/L (ref 135–145)

## 2015-10-20 LAB — CBC WITH DIFFERENTIAL/PLATELET
BASOS PCT: 0.4 % (ref 0.0–3.0)
Basophils Absolute: 0 10*3/uL (ref 0.0–0.1)
EOS ABS: 0.2 10*3/uL (ref 0.0–0.7)
EOS PCT: 2.2 % (ref 0.0–5.0)
HEMATOCRIT: 49.6 % (ref 39.0–52.0)
HEMOGLOBIN: 17 g/dL (ref 13.0–17.0)
Lymphocytes Relative: 30.7 % (ref 12.0–46.0)
Lymphs Abs: 3.1 10*3/uL (ref 0.7–4.0)
MCHC: 34.3 g/dL (ref 30.0–36.0)
MCV: 94.4 fl (ref 78.0–100.0)
MONO ABS: 1 10*3/uL (ref 0.1–1.0)
Monocytes Relative: 9.7 % (ref 3.0–12.0)
NEUTROS ABS: 5.7 10*3/uL (ref 1.4–7.7)
Neutrophils Relative %: 57 % (ref 43.0–77.0)
PLATELETS: 214 10*3/uL (ref 150.0–400.0)
RBC: 5.25 Mil/uL (ref 4.22–5.81)
RDW: 13.1 % (ref 11.5–15.5)
WBC: 10.1 10*3/uL (ref 4.0–10.5)

## 2015-10-20 LAB — MICROALBUMIN / CREATININE URINE RATIO
CREATININE, U: 209.1 mg/dL
MICROALB UR: 1.3 mg/dL (ref 0.0–1.9)
MICROALB/CREAT RATIO: 0.6 mg/g (ref 0.0–30.0)

## 2015-10-20 LAB — HEMOGLOBIN A1C: HEMOGLOBIN A1C: 10.1 % — AB (ref 4.6–6.5)

## 2015-10-20 LAB — PSA: PSA: 0.94 ng/mL (ref 0.10–4.00)

## 2015-10-20 LAB — LDL CHOLESTEROL, DIRECT: LDL DIRECT: 77 mg/dL

## 2015-10-21 LAB — HEPATITIS C ANTIBODY: HCV Ab: NEGATIVE

## 2015-10-21 LAB — HIV ANTIBODY (ROUTINE TESTING W REFLEX): HIV 1&2 Ab, 4th Generation: NONREACTIVE

## 2015-10-24 ENCOUNTER — Encounter: Payer: Self-pay | Admitting: Family Medicine

## 2015-10-24 ENCOUNTER — Ambulatory Visit (INDEPENDENT_AMBULATORY_CARE_PROVIDER_SITE_OTHER): Payer: 59 | Admitting: Family Medicine

## 2015-10-24 VITALS — BP 140/86 | HR 73 | Temp 98.4°F | Ht 71.25 in | Wt 210.5 lb

## 2015-10-24 DIAGNOSIS — E1165 Type 2 diabetes mellitus with hyperglycemia: Principal | ICD-10-CM

## 2015-10-24 DIAGNOSIS — E784 Other hyperlipidemia: Secondary | ICD-10-CM

## 2015-10-24 DIAGNOSIS — E7849 Other hyperlipidemia: Secondary | ICD-10-CM

## 2015-10-24 DIAGNOSIS — IMO0002 Reserved for concepts with insufficient information to code with codable children: Secondary | ICD-10-CM

## 2015-10-24 DIAGNOSIS — E1140 Type 2 diabetes mellitus with diabetic neuropathy, unspecified: Secondary | ICD-10-CM

## 2015-10-24 NOTE — Progress Notes (Signed)
Pre visit review using our clinic review tool, if applicable. No additional management support is needed unless otherwise documented below in the visit note. 

## 2015-10-24 NOTE — Progress Notes (Signed)
Dr. Frederico Hamman T. Harlen Danford, MD, [facility] [place], [address] Phone: [phone] Fax: [phone]  10/24/2015  Patient: Connor Moore, MRN: [id number], DOB: 1950/06/17, 65 y.o.  Primary Physician:  [name], MD   Chief Complaint  Patient presents with  . Diabetes   Subjective:   Connor Moore is a 66 y.o. very pleasant male patient who presents with the following:  Diabetes Mellitus: Tolerating Medications: yes Compliance with diet: poor Exercise: minimal / intermittent Avg blood sugars at home: not checking Foot problems: none Hypoglycemia: none No nausea, vomitting, blurred vision, polyuria.  Lab Results  Component Value Date   HGBA1C 10.1 (H) 10/20/2015   HGBA1C 8.4 (H) 03/23/2015   HGBA1C 7.7 (H) 06/28/2014   Lab Results  Component Value Date   MICROALBUR 1.3 10/20/2015   LDLCALC 61 03/02/2013   CREATININE 0.82 10/20/2015    Wt Readings from Last 3 Encounters:  10/24/15 210 lb 8 oz (95.5 kg)  07/25/15 210 lb 8 oz (95.5 kg)  06/23/15 215 lb (97.5 kg)    Body mass index is 29.15 kg/m.   Lipids: Doing well, stable. Tolerating meds fine with no SE. Panel reviewed with patient.  Lipids:    Component Value Date/Time   CHOL 152 10/20/2015 0749   TRIG 254.0 (H) 10/20/2015 0749   HDL 35.80 (L) 10/20/2015 0749   LDLDIRECT 77.0 10/20/2015 0749   VLDL 50.8 (H) 10/20/2015 0749   CHOLHDL 4 10/20/2015 0749    Lab Results  Component Value Date   ALT 15 10/20/2015   AST 11 10/20/2015   ALKPHOS 82 10/20/2015   BILITOT 0.7 10/20/2015     Past Medical History, Surgical History, Social History, Family History, Problem List, Medications, and Allergies have been reviewed and updated if relevant.  Patient Active Problem List   Diagnosis Date Noted  . Type 2 diabetes, uncontrolled, with neuropathy ([place]) 04/01/2008    Priority: High  . Diabetic neuropathy ([place]) 12/07/2010    Priority:  Medium  . Hyperlipidemia 02/12/2008    Priority: Medium  . Erectile dysfunction associated with type 2 diabetes mellitus ([place]) 02/12/2008    Priority: Medium  . Essential hypertension 02/12/2008    Priority: Medium  . 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 Diverticulosis   . Colon polyps   . TOBACCO USE 05/19/2009    Past Medical History:  Diagnosis Date  . Colon polyps   . Diabetes mellitus type II   . Diabetic neuropathy ([place]) 12/07/2010  . Diverticulosis   . ED (erectile dysfunction)    rare  . HLD (hyperlipidemia)   . HTN (hypertension)   . Obesity     Past Surgical History:  Procedure Laterality Date  . APPENDECTOMY    . INGUINAL HERNIA REPAIR  1974  . KNEE ARTHROSCOPY  1986   Right  . Left Hydrocele surgery  1974  . REPLACEMENT UNICONDYLAR JOINT KNEE  2009  . TESTICLE REMOVAL  1974   Left  . TONSILLECTOMY    . VASECTOMY  23    Social History   Social History  . Marital status: Married    Spouse name: [name]  . Number of children: [name]  . Years of education: [name]   Occupational History  . General Contractor, [employer]    Social History Main Topics  . Smoking status: Former Smoker    Packs/day: 0.75    Years: 30.00  Types: Cigarettes    Start date: 07/14/2015  . Smokeless tobacco: Never Used     Comment: ? quit 2011  . Alcohol use 0.0 oz/week     Comment: Socially  . Drug use: No  . Sexual activity: Not on file   Other Topics Concern  . Not on file   Social History Narrative  . No narrative on file    Family History  Problem Relation Age of Onset  . Diabetes Father   . Diabetes Sister   . Mental illness Mother     ?   [name] Cancer Father     mouth    No Known Allergies  Medication list reviewed and updated in full in [place].   GEN: No acute illnesses, no fevers, chills. GI: No n/v/d, eating normally Pulm: No SOB Interactive and getting along well at home.  Otherwise, ROS is as  per the HPI.  Objective:   BP 140/86   Pulse 73   Temp 98.4 F (36.9 C) (Oral)   Ht 5' 11.25" (1.81 m)   Wt 210 lb 8 oz (95.5 kg)   BMI 29.15 kg/m   GEN: WDWN, NAD, Non-toxic, A & O x 3 HEENT: Atraumatic, Normocephalic. Neck supple. No masses, No LAD. Ears and Nose: No external deformity. CV: RRR, No M/G/R. No JVD. No thrill. No extra heart sounds. PULM: CTA B, no wheezes, crackles, rhonchi. No retractions. No resp. distress. No accessory muscle use. EXTR: No c/c/e NEURO Normal gait.  PSYCH: Normally interactive. Conversant. Not depressed or anxious appearing.  Calm demeanor.   Laboratory and Imaging Data: Results for orders placed or performed in visit on 10/20/15  Lipid panel  Result Value Ref Range   Cholesterol 152 0 - 200 mg/dL   Triglycerides 254.0 (H) 0.0 - 149.0 mg/dL   HDL 35.80 (L) >39.00 mg/dL   VLDL 50.8 (H) 0.0 - 40.0 mg/dL   Total CHOL/HDL Ratio 4    NonHDL 116.26   Hemoglobin A1c  Result Value Ref Range   Hgb A1c MFr Bld 10.1 (H) 4.6 - 6.5 %  Microalbumin / creatinine urine ratio  Result Value Ref Range   Microalb, Ur 1.3 0.0 - 1.9 mg/dL   Creatinine,U 209.1 mg/dL   Microalb Creat Ratio 0.6 0.0 - 30.0 mg/g  Hepatic function panel  Result Value Ref Range   Total Bilirubin 0.7 0.2 - 1.2 mg/dL   Bilirubin, Direct 0.1 0.0 - 0.3 mg/dL   Alkaline Phosphatase 82 39 - 117 U/L   AST 11 0 - 37 U/L   ALT 15 0 - 53 U/L   Total Protein 6.8 6.0 - 8.3 g/dL   Albumin 4.1 3.5 - 5.2 g/dL  Basic metabolic panel  Result Value Ref Range   Sodium 139 135 - 145 mEq/L   Potassium 4.6 3.5 - 5.1 mEq/L   Chloride 103 96 - 112 mEq/L   CO2 28 19 - 32 mEq/L   Glucose, Bld 274 (H) 70 - 99 mg/dL   BUN 19 6 - 23 mg/dL   Creatinine, Ser 0.82 0.40 - 1.50 mg/dL   Calcium 9.2 8.4 - 10.5 mg/dL   GFR 100.68 >60.00 mL/min  CBC with Differential/Platelet  Result Value Ref Range   WBC 10.1 4.0 - 10.5 K/uL   RBC 5.25 4.22 - 5.81 Mil/uL   Hemoglobin 17.0 13.0 - 17.0 g/dL   HCT  49.6 39.0 - 52.0 %   MCV 94.4 78.0 - 100.0 fl   MCHC 34.3  30.0 - 36.0 g/dL   RDW 13.1 11.5 - 15.5 %   Platelets 214.0 150.0 - 400.0 K/uL   Neutrophils Relative % 57.0 43.0 - 77.0 %   Lymphocytes Relative 30.7 12.0 - 46.0 %   Monocytes Relative 9.7 3.0 - 12.0 %   Eosinophils Relative 2.2 0.0 - 5.0 %   Basophils Relative 0.4 0.0 - 3.0 %   Neutro Abs 5.7 1.4 - 7.7 K/uL   Lymphs Abs 3.1 0.7 - 4.0 K/uL   Monocytes Absolute 1.0 0.1 - 1.0 K/uL   Eosinophils Absolute 0.2 0.0 - 0.7 K/uL   Basophils Absolute 0.0 0.0 - 0.1 K/uL  PSA  Result Value Ref Range   PSA 0.94 0.10 - 4.00 ng/mL  HIV antibody  Result Value Ref Range   HIV 1&2 Ab, 4th Generation NONREACTIVE NONREACTIVE  Hepatitis C antibody  Result Value Ref Range   HCV Ab NEGATIVE NEGATIVE  LDL cholesterol, direct  Result Value Ref Range   Direct LDL 77.0 mg/dL     Assessment and Plan:   Type 2 diabetes, uncontrolled, with neuropathy ([place])  Other hyperlipidemia  >25 minutes spent in face to face time with patient, >50% spent in counselling or coordination of care  DM with poor control. Recommended going to insulin - he declines. Declines [place] or similar med - any injectable.  Work on diet and losing weight.   Lipids stable  Follow-up: Return in about 4 months (around 02/24/2016).  Signed,  [name]. Marquon Alcala, MD   Patient's Medications  New Prescriptions   No medications on file  Previous Medications   ASPIRIN 81 MG TABLET    Take 81 mg by mouth daily.     ATORVASTATIN (LIPITOR) 20 MG TABLET    Take 1 tablet by mouth  daily   GLIPIZIDE (GLUCOTROL XL) 10 MG 24 HR TABLET    Take 2 tablets by mouth  daily with breakfast   GLUCOSE BLOOD (GLUCOMETER ELITE TEST STRIPS) TEST STRIP    1 each by Other route 2 (two) times daily. Use as instructed    LANCETS 28G MISC    by Does not apply route as directed.     METFORMIN (GLUCOPHAGE-XR) 500 MG 24 HR TABLET    Take 4 tablets by mouth  daily with breakfast   ONGLYZA 5 MG TABS  TABLET    Take 1 tablet by mouth  daily   SILDENAFIL (REVATIO) 20 MG TABLET    TAKE 2 TO 5 TABLETS BY MOUTH 30 MINUTES PRIOR TOINTERCOURSE  Modified Medications   No medications on file  Discontinued Medications   ALBUTEROL (PROVENTIL HFA;VENTOLIN HFA) 108 (90 BASE) MCG/ACT INHALER    Inhale 2 puffs into the lungs every 4 (four) hours as needed for wheezing or shortness of breath.   AMOXICILLIN-CLAVULANATE (AUGMENTIN) 875-125 MG TABLET    Take 1 tablet by mouth 2 (two) times daily.   PREDNISONE (DELTASONE) 20 MG TABLET    Take 2 tablets (40 mg total) by mouth daily with breakfast.   VARDENAFIL (LEVITRA) 10 MG TABLET    Take 10 mg by mouth daily as needed. 30 minutes prior to intercourse

## 2015-12-28 LAB — HM DIABETES EYE EXAM

## 2016-01-10 ENCOUNTER — Telehealth: Payer: Self-pay

## 2016-01-10 MED ORDER — OSELTAMIVIR PHOSPHATE 75 MG PO CAPS: 75.0000 mg | ORAL_CAPSULE | Freq: Every day | ORAL | 0 refills | Status: DC

## 2016-01-10 NOTE — Telephone Encounter (Signed)
PLEASE NOTE: All timestamps contained within this report are represented as [place] Standard Time. CONFIDENTIALTY NOTICE: This fax transmission is intended only for the addressee. It contains information that is legally privileged, confidential or otherwise protected from use or disclosure. If you are not the intended recipient, you are strictly prohibited from reviewing, disclosing, copying using or disseminating any of this information or taking any action in reliance on or regarding this information. If you have received this fax in error, please notify us immediately by telephone so that we can arrange for its return to [place]. Phone: [phone], Toll-Free: [phone], Fax: [phone] Page: 1 of 2 Call Id: [id number] [place] Patient Name: Connor Moore Gender: Male DOB: December 20, 1950 Age: 65 Y 9 M 9 D Return Phone Number: [phone] (Primary) Address: City/State/Zip: [place] Client [place] Night - Client Client Site [place] Physician Copland, Frederico Hamman - MD Contact Type Call Who Is Calling Patient / Member / Family / Caregiver Call Type Triage / Clinical Caller Name Dayvian Niemeyer Relationship To Patient Spouse Return Phone Number [phone] (Primary) Chief Complaint Flu Symptom Reason for Call Symptomatic / Request for Harold states that her husband is having flu symptoms and a fever of 101.3. [place] Not Listed [facility] PreDisposition Call Doctor Translation No Nurse Assessment Nurse: Beryl Meager, RN, Mickel Baas Date/Time Eilene Ghazi Time): 01/09/2016 8:45:53 AM Confirm and document reason for call. If symptomatic, describe symptoms. ---Caller states that her husband is having flu symptoms and a fever of 101.3. He is caller's caretaker. Wife has flu. Does the patient have any new  or worsening symptoms? ---Yes Will a triage be completed? ---Yes Related visit to physician within the last 2 weeks? ---No Does the PT have any chronic conditions? (i.e. diabetes, asthma, etc.) ---Yes List chronic conditions. ---htn, dm, Is this a behavioral health or substance abuse call? ---No Guidelines Guideline Title Affirmed Question Affirmed Notes Nurse Date/Time (Eastern Time) Influenza - Seasonal [1] Fever > 101 F (38.3 C) AND [2] age > 28 Nolene Bernheim 01/09/2016 8:48:05 AM Disp. Time Eilene Ghazi Time) Disposition Final User 01/09/2016 8:41:55 AM Send To Call Back Waiting For Nurse Eather Colas 01/09/2016 8:54:25 AM See Physician within 4 Hours (or PCP triage) Yes Beryl Meager, RN, Haydee Salter NOTE: All timestamps contained within this report are represented as [place] Standard Time. CONFIDENTIALTY NOTICE: This fax transmission is intended only for the addressee. It contains information that is legally privileged, confidential or otherwise protected from use or disclosure. If you are not the intended recipient, you are strictly prohibited from reviewing, disclosing, copying using or disseminating any of this information or taking any action in reliance on or regarding this information. If you have received this fax in error, please notify us immediately by telephone so that we can arrange for its return to [place]. Phone: [phone], Toll-Free: [phone], Fax: [phone] Page: 2 of 2 Call Id: [id number] [place] Understands: Yes Disagree/Comply: Comply Care Advice Given Per Guideline SEE PHYSICIAN WITHIN 4 HOURS (or PCP triage): CARE ADVICE given per INFLUENZA - SEASONAL (Adult) guideline. CALL BACK IF: * You become worse. After Care Instructions Given Call Event Type User Date / Time Description Education document email Nolene Bernheim 01/09/2016 8:56:22 AM [facility] Connect Now Instructions Comments User: Eather Colas Date/Time Eilene Ghazi Time): 01/09/2016 8:42:20  AM [facility] 1st call 10 pm Sunday User: Lynne Leader, RN Date/Time (  Eastern Time): 01/09/2016 8:47:03 AM Symptoms started last n ight User: Lynne Leader, RN Date/Time Eilene Ghazi Time): 01/09/2016 8:50:02 AM current temp is 101, oral, without advil User: Lynne Leader, RN Date/Time (Eastern Time): 01/09/2016 8:58:17 AM Caller is upset that tamiflu will not be called in. RN advised that it is not policy to call in medications after hours. Caller states they are not able to go to facility, Advised to use [facility] telehealth. Verbalized understanding. Referrals GO TO FACILITY OTHER - SPECIFY REFERRED TO PCP OFFICE GO TO FACILITY REFUSED GO TO FACILITY OTHER - SPECIFY

## 2016-01-10 NOTE — Telephone Encounter (Signed)
Called patient's wife Mrs Dorschner and notified her that Tamiflu has been sent to the pharmacy

## 2016-01-10 NOTE — Telephone Encounter (Signed)
Tamiflu 75 mg, 1 po bid, #10, 0 refills  

## 2016-01-10 NOTE — Telephone Encounter (Signed)
Per this phone issue already taken care of.

## 2016-01-10 NOTE — Telephone Encounter (Signed)
Connor Frederique left v/m; Connor Moore was seen 01/05/16 and dx with the flu and was given tamiflu. On 01/08/16 pt developed same symptoms as Connor Moore and pt started taking Connor Moore's tamiflu. Connor Moore request rx of tamiflu to [facility] for pt. Pt is not able to come in for appt. Connor Moore request cb.

## 2016-01-25 ENCOUNTER — Encounter: Payer: Self-pay | Admitting: Family Medicine

## 2016-02-20 ENCOUNTER — Other Ambulatory Visit (INDEPENDENT_AMBULATORY_CARE_PROVIDER_SITE_OTHER): Payer: 59

## 2016-02-20 DIAGNOSIS — E1165 Type 2 diabetes mellitus with hyperglycemia: Secondary | ICD-10-CM | POA: Diagnosis not present

## 2016-02-20 DIAGNOSIS — E1140 Type 2 diabetes mellitus with diabetic neuropathy, unspecified: Secondary | ICD-10-CM

## 2016-02-20 DIAGNOSIS — IMO0002 Reserved for concepts with insufficient information to code with codable children: Secondary | ICD-10-CM

## 2016-02-20 LAB — BASIC METABOLIC PANEL
BUN: 19 mg/dL (ref 6–23)
CHLORIDE: 109 meq/L (ref 96–112)
CO2: 24 meq/L (ref 19–32)
Calcium: 9.3 mg/dL (ref 8.4–10.5)
Creatinine, Ser: 0.83 mg/dL (ref 0.40–1.50)
GFR: 99.17 mL/min (ref >60.00)
Glucose, Bld: 225 mg/dL — ABNORMAL HIGH (ref 70–99)
POTASSIUM: 4.4 meq/L (ref 3.5–5.1)
Sodium: 139 mEq/L (ref 135–145)

## 2016-02-20 LAB — HEMOGLOBIN A1C: Hgb A1c MFr Bld: 9.5 % — ABNORMAL HIGH (ref 4.6–6.5)

## 2016-02-27 ENCOUNTER — Ambulatory Visit: Payer: 59 | Admitting: Family Medicine

## 2016-03-02 ENCOUNTER — Other Ambulatory Visit: Payer: Self-pay | Admitting: Family Medicine

## 2016-04-02 ENCOUNTER — Ambulatory Visit: Payer: 59 | Admitting: Family Medicine

## 2016-04-04 ENCOUNTER — Encounter: Payer: Self-pay | Admitting: Family Medicine

## 2016-04-04 ENCOUNTER — Ambulatory Visit (INDEPENDENT_AMBULATORY_CARE_PROVIDER_SITE_OTHER): Payer: 59 | Admitting: Family Medicine

## 2016-04-04 VITALS — BP 130/84 | HR 73 | Temp 98.3°F | Ht 71.25 in | Wt 221.0 lb

## 2016-04-04 DIAGNOSIS — E1165 Type 2 diabetes mellitus with hyperglycemia: Secondary | ICD-10-CM

## 2016-04-04 DIAGNOSIS — E1140 Type 2 diabetes mellitus with diabetic neuropathy, unspecified: Secondary | ICD-10-CM | POA: Diagnosis not present

## 2016-04-04 DIAGNOSIS — IMO0002 Reserved for concepts with insufficient information to code with codable children: Secondary | ICD-10-CM

## 2016-04-04 DIAGNOSIS — Z1211 Encounter for screening for malignant neoplasm of colon: Secondary | ICD-10-CM

## 2016-04-04 MED ORDER — PIOGLITAZONE HCL 30 MG PO TABS: 30.0000 mg | ORAL_TABLET | Freq: Every day | ORAL | 3 refills | Status: DC

## 2016-04-04 NOTE — Patient Instructions (Signed)

## 2016-04-04 NOTE — Progress Notes (Signed)
Dr. Frederico Hamman T. Adalida Garver, MD, [facility] [place], [address] Phone: [phone] Fax: [phone]  04/04/2016  Patient: Connor Moore, MRN: [id number], DOB: 1950-09-02, 66 y.o.  Primary Physician:  [name], MD   Chief Complaint  Patient presents with  . Diabetes   Subjective:   Connor Moore is a 66 y.o. very pleasant male patient who presents with the following:  Diabetes Mellitus: Tolerating Medications: yes Compliance with diet: fair Exercise: minimal / intermittent Avg blood sugars at home: at least 180 when checking Foot problems: none Hypoglycemia: none No nausea, vomitting, blurred vision, polyuria.  Lab Results  Component Value Date   HGBA1C 9.5 (H) 02/20/2016   HGBA1C 10.1 (H) 10/20/2015   HGBA1C 8.4 (H) 03/23/2015   Lab Results  Component Value Date   MICROALBUR 1.3 10/20/2015   LDLCALC 61 03/02/2013   CREATININE 0.83 02/20/2016    Wt Readings from Last 3 Encounters:  04/04/16 221 lb (100.2 kg)  10/24/15 210 lb 8 oz (95.5 kg)  07/25/15 210 lb 8 oz (95.5 kg)    Body mass index is 30.61 kg/m.   Needs colon  Past Medical History, Surgical History, Social History, Family History, Problem List, Medications, and Allergies have been reviewed and updated if relevant.  Patient Active Problem List   Diagnosis Date Noted  . Type 2 diabetes, uncontrolled, with neuropathy ([place]) 04/01/2008    Priority: High  . Diabetic neuropathy ([place]) 12/07/2010    Priority: Medium  . Hyperlipidemia 02/12/2008    Priority: Medium  . Erectile dysfunction associated with type 2 diabetes mellitus ([place]) 02/12/2008    Priority: Medium  . Essential hypertension 02/12/2008    Priority: Medium  . 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 Diverticulosis   . Colon polyps   . TOBACCO USE 05/19/2009    Past Medical History:  Diagnosis Date  . Colon polyps   . Diabetes  mellitus type II   . Diabetic neuropathy ([place]) 12/07/2010  . Diverticulosis   . ED (erectile dysfunction)    rare  . HLD (hyperlipidemia)   . HTN (hypertension)   . Obesity     Past Surgical History:  Procedure Laterality Date  . APPENDECTOMY    . INGUINAL HERNIA REPAIR  1974  . KNEE ARTHROSCOPY  1986   Right  . Left Hydrocele surgery  1974  . REPLACEMENT UNICONDYLAR JOINT KNEE  2009  . TESTICLE REMOVAL  1974   Left  . TONSILLECTOMY    . VASECTOMY  36    Social History   Social History  . Marital status: Married    Spouse name: [name]  . Number of children: [name]  . Years of education: [name]   Occupational History  . General Contractor, [employer]    Social History Main Topics  . Smoking status: Former Smoker    Packs/day: 0.75    Years: 30.00    Types: Cigarettes    Start date: 07/14/2015  . Smokeless tobacco: Never Used     Comment: ? quit 2011  . Alcohol use 0.0 oz/week     Comment: Socially  . Drug use: No  . Sexual activity: Not on file   Other Topics Concern  . Not on file   Social History Narrative  . No narrative on file    Family History  Problem Relation Age of Onset  . Diabetes Father   . Diabetes Sister   .  Mental illness Mother     ?   [name] Cancer Father     mouth    No Known Allergies  Medication list reviewed and updated in full in [place].   GEN: No acute illnesses, no fevers, chills. GI: No n/v/d, eating normally Pulm: No SOB Interactive and getting along well at home.  Otherwise, ROS is as per the HPI.  Objective:   BP 130/84   Pulse 73   Temp 98.3 F (36.8 C) (Oral)   Ht 5' 11.25" (1.81 m)   Wt 221 lb (100.2 kg)   BMI 30.61 kg/m   GEN: WDWN, NAD, Non-toxic, A & O x 3 HEENT: Atraumatic, Normocephalic. Neck supple. No masses, No LAD. Ears and Nose: No external deformity. CV: RRR, No M/G/R. No JVD. No thrill. No extra heart sounds. PULM: CTA B, no wheezes, crackles, rhonchi. No retractions. No resp.  distress. No accessory muscle use. EXTR: No c/c/e NEURO Normal gait.  PSYCH: Normally interactive. Conversant. Not depressed or anxious appearing.  Calm demeanor.   Laboratory and Imaging Data: Results for orders placed or performed in visit on 02/20/16  Hemoglobin A1c  Result Value Ref Range   Hgb A1c MFr Bld 9.5 (H) 4.6 - 6.5 %  Basic metabolic panel  Result Value Ref Range   Sodium 139 135 - 145 mEq/L   Potassium 4.4 3.5 - 5.1 mEq/L   Chloride 109 96 - 112 mEq/L   CO2 24 19 - 32 mEq/L   Glucose, Bld 225 (H) 70 - 99 mg/dL   BUN 19 6 - 23 mg/dL   Creatinine, Ser 0.83 0.40 - 1.50 mg/dL   Calcium 9.3 8.4 - 10.5 mg/dL   GFR 99.17 >60.00 mL/min     Assessment and Plan:   Type 2 diabetes, uncontrolled, with neuropathy ([facility])  Screen for colon cancer - Plan: Ambulatory referral to Gastroenterology  Poor control rec insulin - he is not ready for any injectable now.  Start actos - hopefully this will make some difference.  Follow-up: 5 mo  Meds ordered this encounter  Medications  . pioglitazone (ACTOS) 30 MG tablet    Sig: Take 1 tablet (30 mg total) by mouth daily.    Dispense:  90 tablet    Refill:  3   Medications Discontinued During This Encounter  Medication Reason  . oseltamivir (TAMIFLU) 75 MG capsule    Orders Placed This Encounter  Procedures  . Ambulatory referral to Gastroenterology    Signed,  Frederico Hamman T. Shanon Becvar, MD   Allergies as of 04/04/2016   No Known Allergies     Medication List       Accurate as of 04/04/16 10:24 AM. Always use your most recent med list.          aspirin 81 MG tablet Take 81 mg by mouth daily.   atorvastatin 20 MG tablet Commonly known as:  LIPITOR Take 1 tablet by mouth  daily   glipiZIDE 10 MG 24 hr tablet Commonly known as:  GLUCOTROL XL TAKE 2 TABLETS BY MOUTH  DAILY WITH BREAKFAST   GLUCOMETER ELITE TEST STRIPS test strip Generic drug:  glucose blood 1 each by Other route 2 (two) times daily. Use as  instructed   Lancets 28G Misc by Does not apply route as directed.   metFORMIN 500 MG 24 hr tablet Commonly known as:  GLUCOPHAGE-XR TAKE 4 TABLETS BY MOUTH  DAILY WITH BREAKFAST   ONGLYZA 5 MG Tabs tablet Generic drug:  saxagliptin  HCl Take 1 tablet by mouth  daily   pioglitazone 30 MG tablet Commonly known as:  ACTOS Take 1 tablet (30 mg total) by mouth daily.   sildenafil 20 MG tablet Commonly known as:  REVATIO TAKE 2 TO 5 TABLETS BY MOUTH 30 MINUTES PRIOR TOINTERCOURSE

## 2016-04-04 NOTE — Progress Notes (Signed)
Pre visit review using our clinic review tool, if applicable. No additional management support is needed unless otherwise documented below in the visit note. 

## 2016-04-16 ENCOUNTER — Encounter: Payer: Self-pay | Admitting: Gastroenterology

## 2016-04-25 ENCOUNTER — Other Ambulatory Visit: Payer: Self-pay | Admitting: Family Medicine

## 2016-05-30 ENCOUNTER — Other Ambulatory Visit: Payer: Self-pay | Admitting: *Deleted

## 2016-05-30 MED ORDER — GLIPIZIDE ER 10 MG PO TB24: ORAL_TABLET | ORAL | 1 refills | Status: DC

## 2016-05-30 MED ORDER — METFORMIN HCL ER 500 MG PO TB24: ORAL_TABLET | ORAL | 1 refills | Status: DC

## 2016-06-05 ENCOUNTER — Ambulatory Visit (AMBULATORY_SURGERY_CENTER): Payer: Self-pay

## 2016-06-05 ENCOUNTER — Encounter: Payer: Self-pay | Admitting: Gastroenterology

## 2016-06-05 VITALS — Ht 71.0 in | Wt 219.2 lb

## 2016-06-05 DIAGNOSIS — Z86010 Personal history of colonic polyps: Secondary | ICD-10-CM

## 2016-06-05 MED ORDER — NA SULFATE-K SULFATE-MG SULF 17.5-3.13-1.6 GM/177ML PO SOLN: 1.0000 | Freq: Once | ORAL | 0 refills | Status: AC

## 2016-06-05 NOTE — Progress Notes (Signed)
Denies allergies to eggs or soy products. Denies complication of anesthesia or sedation. Denies use of weight loss medication. Denies use of O2.   Emmi instructions given for colonoscopy.  

## 2016-06-18 ENCOUNTER — Encounter: Payer: Self-pay | Admitting: Gastroenterology

## 2016-06-18 ENCOUNTER — Ambulatory Visit (AMBULATORY_SURGERY_CENTER): Payer: 59 | Admitting: Gastroenterology

## 2016-06-18 VITALS — BP 130/97 | HR 65 | Temp 97.1°F | Resp 15 | Ht 71.5 in | Wt 221.0 lb

## 2016-06-18 DIAGNOSIS — D123 Benign neoplasm of transverse colon: Secondary | ICD-10-CM | POA: Diagnosis not present

## 2016-06-18 DIAGNOSIS — Z86010 Personal history of colonic polyps: Secondary | ICD-10-CM

## 2016-06-18 DIAGNOSIS — D128 Benign neoplasm of rectum: Secondary | ICD-10-CM | POA: Diagnosis not present

## 2016-06-18 DIAGNOSIS — D129 Benign neoplasm of anus and anal canal: Secondary | ICD-10-CM

## 2016-06-18 MED ORDER — SODIUM CHLORIDE 0.9 % IV SOLN
500.0000 mL | INTRAVENOUS | Status: DC
Start: 2016-06-18 — End: 2022-08-08

## 2016-06-18 NOTE — Progress Notes (Signed)
Called to room to assist during endoscopic procedure.  Patient ID and intended procedure confirmed with present staff. Received instructions for my participation in the procedure from the performing physician.  

## 2016-06-18 NOTE — Patient Instructions (Signed)
YOU HAD AN ENDOSCOPIC PROCEDURE TODAY AT THE [facility]:   Refer to the procedure report that was given to you for any specific questions about what was found during the examination.  If the procedure report does not answer your questions, please call your gastroenterologist to clarify.  If you requested that your care partner not be given the details of your procedure findings, then the procedure report has been included in a sealed envelope for you to review at your convenience later.  YOU SHOULD EXPECT: Some feelings of bloating in the abdomen. Passage of more gas than usual.  Walking can help get rid of the air that was put into your GI tract during the procedure and reduce the bloating. If you had a lower endoscopy (such as a colonoscopy or flexible sigmoidoscopy) you may notice spotting of blood in your stool or on the toilet paper. If you underwent a bowel prep for your procedure, you may not have a normal bowel movement for a few days.  Please Note:  You might notice some irritation and congestion in your nose or some drainage.  This is from the oxygen used during your procedure.  There is no need for concern and it should clear up in a day or so.  SYMPTOMS TO REPORT IMMEDIATELY:   Following lower endoscopy (colonoscopy or flexible sigmoidoscopy):  Excessive amounts of blood in the stool  Significant tenderness or worsening of abdominal pains  Swelling of the abdomen that is new, acute  Fever of 100F or higher   For urgent or emergent issues, a gastroenterologist can be reached at any hour by calling [phone].   DIET:  We do recommend a small meal at first, but then you may proceed to your regular diet.  Drink plenty of fluids but you should avoid alcoholic beverages for 24 hours.  ACTIVITY:  You should plan to take it easy for the rest of today and you should NOT DRIVE or use heavy machinery until tomorrow (because of the sedation medicines used during the test).     FOLLOW UP: Our staff will call the number listed on your records the next business day following your procedure to check on you and address any questions or concerns that you may have regarding the information given to you following your procedure. If we do not reach you, we will leave a message.  However, if you are feeling well and you are not experiencing any problems, there is no need to return our call.  We will assume that you have returned to your regular daily activities without incident.  If any biopsies were taken you will be contacted by phone or by letter within the next 1-3 weeks.  Please call us at [phone] if you have not heard about the biopsies in 3 weeks.    SIGNATURES/CONFIDENTIALITY: You and/or your care partner have signed paperwork which will be entered into your electronic medical record.  These signatures attest to the fact that that the information above on your After Visit Summary has been reviewed and is understood.  Full responsibility of the confidentiality of this discharge information lies with you and/or your care-partner.  Read all of the handouts given to you by your recovery room nurse. 

## 2016-06-18 NOTE — Op Note (Signed)
[place] Patient Name: Connor Moore Procedure Date: 06/18/2016 8:47 AM MRN: [id number] Endoscopist: Milus Banister , MD Age: 66 Referring MD:  Date of Birth: 11-27-1950 Gender: Male Account #: [id number] Procedure:                Colonoscopy Indications:              High risk colon cancer surveillance: Personal                            history of colonic polyps; colonoscopy 2010 3 subCM                            polyps, one was adenomatous Medicines:                Monitored Anesthesia Care Procedure:                Pre-Anesthesia Assessment:                           - Prior to the procedure, a History and Physical                            was performed, and patient medications and                            allergies were reviewed. The patient's tolerance of                            previous anesthesia was also reviewed. The risks                            and benefits of the procedure and the sedation                            options and risks were discussed with the patient.                            All questions were answered, and informed consent                            was obtained. Prior Anticoagulants: The patient has                            taken no previous anticoagulant or antiplatelet                            agents. ASA Grade Assessment: II - A patient with                            mild systemic disease. After reviewing the risks                            and benefits, the patient was deemed in  satisfactory condition to undergo the procedure.                           After obtaining informed consent, the colonoscope                            was passed under direct vision. Throughout the                            procedure, the patient's blood pressure, pulse, and                            oxygen saturations were monitored continuously. The                            Colonoscope was introduced through  the anus and                            advanced to the the cecum, identified by                            appendiceal orifice and ileocecal valve. The                            colonoscopy was performed without difficulty. The                            patient tolerated the procedure well. The quality                            of the bowel preparation was good. The ileocecal                            valve, appendiceal orifice, and rectum were                            photographed. Scope In: 8:52:54 AM Scope Out: 9:10:45 AM Scope Withdrawal Time: 0 hours 12 minutes 46 seconds  Total Procedure Duration: 0 hours 17 minutes 51 seconds  Findings:                 A 4 mm polyp was found in the transverse colon. The                            polyp was sessile. The polyp was removed with a                            cold snare. Resection and retrieval were complete.                           A 11 mm polyp was found in the rectum. The polyp                            was semi-pedunculated. The polyp was removed  with a                            hot snare. Resection and retrieval were complete.                           The exam was otherwise without abnormality on                            direct and retroflexion views. Complications:            No immediate complications. Estimated blood loss:                            None. Estimated Blood Loss:     Estimated blood loss: none. Impression:               - One 4 mm polyp in the transverse colon, removed                            with a cold snare. Resected and retrieved.                           - One 11 mm polyp in the rectum, removed with a hot                            snare. Resected and retrieved.                           - The examination was otherwise normal on direct                            and retroflexion views. Recommendation:           - Patient has a contact number available for                             emergencies. The signs and symptoms of potential                            delayed complications were discussed with the                            patient. Return to normal activities tomorrow.                            Written discharge instructions were provided to the                            patient.                           - Resume previous diet.                           - Continue present medications.  You will receive a letter within 2-3 weeks with the                            pathology results and my final recommendations.                           If the polyp(s) is proven to be 'pre-cancerous' on                            pathology, you will need repeat colonoscopy in 3-5                            years. If the polyp(s) is NOT 'precancerous' on                            pathology then you should repeat colon cancer                            screening in 10 years with colonoscopy without need                            for colon cancer screening by any method prior to                            then (including stool testing). Milus Banister, MD 06/18/2016 9:13:57 AM This report has been signed electronically.

## 2016-06-18 NOTE — Progress Notes (Signed)
Report given to PACU, vss 

## 2016-06-19 ENCOUNTER — Telehealth: Payer: Self-pay

## 2016-06-19 NOTE — Telephone Encounter (Signed)
  Follow up Call-  Call back number 06/18/2016  Post procedure Call Back phone  # [phone]  Permission to leave phone message Yes  Some recent data might be hidden     Patient questions:  Do you have a fever, pain , or abdominal swelling? No. Pain Score  0 *  Have you tolerated food without any problems? Yes.    Have you been able to return to your normal activities? Yes.    Do you have any questions about your discharge instructions: Diet   No. Medications  No. Follow up visit  No.  Do you have questions or concerns about your Care? No.  Actions: * If pain score is 4 or above: No action needed, pain <4.

## 2016-07-03 ENCOUNTER — Encounter: Payer: Self-pay | Admitting: Gastroenterology

## 2016-08-01 ENCOUNTER — Other Ambulatory Visit: Payer: Self-pay | Admitting: *Deleted

## 2016-08-01 MED ORDER — PIOGLITAZONE HCL 30 MG PO TABS: 30.0000 mg | ORAL_TABLET | Freq: Every day | ORAL | 2 refills | Status: DC

## 2016-08-27 ENCOUNTER — Other Ambulatory Visit (INDEPENDENT_AMBULATORY_CARE_PROVIDER_SITE_OTHER): Payer: 59

## 2016-08-27 DIAGNOSIS — E785 Hyperlipidemia, unspecified: Secondary | ICD-10-CM

## 2016-08-27 DIAGNOSIS — E119 Type 2 diabetes mellitus without complications: Secondary | ICD-10-CM | POA: Diagnosis not present

## 2016-08-27 DIAGNOSIS — Z79899 Other long term (current) drug therapy: Secondary | ICD-10-CM

## 2016-08-27 LAB — MICROALBUMIN / CREATININE URINE RATIO
CREATININE, U: 262.9 mg/dL
MICROALB UR: 2.3 mg/dL — AB (ref 0.0–1.9)
Microalb Creat Ratio: 0.9 mg/g (ref 0.0–30.0)

## 2016-08-27 LAB — BASIC METABOLIC PANEL
BUN: 15 mg/dL (ref 6–23)
CO2: 26 mEq/L (ref 19–32)
CREATININE: 0.82 mg/dL (ref 0.40–1.50)
Calcium: 9.6 mg/dL (ref 8.4–10.5)
Chloride: 106 mEq/L (ref 96–112)
GFR: 100.4 mL/min (ref >60.00)
GLUCOSE: 201 mg/dL — AB (ref 70–99)
POTASSIUM: 5.2 meq/L — AB (ref 3.5–5.1)
Sodium: 139 mEq/L (ref 135–145)

## 2016-08-27 LAB — CBC WITH DIFFERENTIAL/PLATELET
BASOS PCT: 0.5 % (ref 0.0–3.0)
Basophils Absolute: 0 10*3/uL (ref 0.0–0.1)
EOS ABS: 0.2 10*3/uL (ref 0.0–0.7)
Eosinophils Relative: 2.1 % (ref 0.0–5.0)
HCT: 48.5 % (ref 39.0–52.0)
Hemoglobin: 16.1 g/dL (ref 13.0–17.0)
Lymphocytes Relative: 27.9 % (ref 12.0–46.0)
Lymphs Abs: 2.5 10*3/uL (ref 0.7–4.0)
MCHC: 33.2 g/dL (ref 30.0–36.0)
MCV: 100.1 fl — ABNORMAL HIGH (ref 78.0–100.0)
MONO ABS: 0.8 10*3/uL (ref 0.1–1.0)
Monocytes Relative: 8.5 % (ref 3.0–12.0)
NEUTROS ABS: 5.4 10*3/uL (ref 1.4–7.7)
Neutrophils Relative %: 61 % (ref 43.0–77.0)
PLATELETS: 208 10*3/uL (ref 150.0–400.0)
RBC: 4.84 Mil/uL (ref 4.22–5.81)
RDW: 13.6 % (ref 11.5–15.5)
WBC: 8.8 10*3/uL (ref 4.0–10.5)

## 2016-08-27 LAB — HEPATIC FUNCTION PANEL
ALT: 10 U/L (ref 0–53)
AST: 13 U/L (ref 0–37)
Albumin: 4.5 g/dL (ref 3.5–5.2)
Alkaline Phosphatase: 56 U/L (ref 39–117)
BILIRUBIN TOTAL: 0.7 mg/dL (ref 0.2–1.2)
Bilirubin, Direct: 0.2 mg/dL (ref 0.0–0.3)
Total Protein: 6.5 g/dL (ref 6.0–8.3)

## 2016-08-27 LAB — HEMOGLOBIN A1C: HEMOGLOBIN A1C: 8.5 % — AB (ref 4.6–6.5)

## 2016-08-27 LAB — LIPID PANEL
CHOLESTEROL: 93 mg/dL (ref 0–200)
HDL: 39.8 mg/dL (ref >39.00)
LDL Cholesterol: 37 mg/dL (ref 0–99)
NonHDL: 53.65
TRIGLYCERIDES: 85 mg/dL (ref 0.0–149.0)
Total CHOL/HDL Ratio: 2
VLDL: 17 mg/dL (ref 0.0–40.0)

## 2016-09-03 ENCOUNTER — Ambulatory Visit (INDEPENDENT_AMBULATORY_CARE_PROVIDER_SITE_OTHER): Payer: 59 | Admitting: Family Medicine

## 2016-09-03 ENCOUNTER — Encounter: Payer: Self-pay | Admitting: Family Medicine

## 2016-09-03 VITALS — BP 140/70 | HR 86 | Temp 98.9°F | Ht 71.25 in | Wt 210.5 lb

## 2016-09-03 DIAGNOSIS — IMO0002 Reserved for concepts with insufficient information to code with codable children: Secondary | ICD-10-CM

## 2016-09-03 DIAGNOSIS — I10 Essential (primary) hypertension: Secondary | ICD-10-CM | POA: Diagnosis not present

## 2016-09-03 DIAGNOSIS — E7800 Pure hypercholesterolemia, unspecified: Secondary | ICD-10-CM

## 2016-09-03 DIAGNOSIS — E1165 Type 2 diabetes mellitus with hyperglycemia: Secondary | ICD-10-CM

## 2016-09-03 DIAGNOSIS — E1140 Type 2 diabetes mellitus with diabetic neuropathy, unspecified: Secondary | ICD-10-CM | POA: Diagnosis not present

## 2016-09-03 MED ORDER — PIOGLITAZONE HCL 45 MG PO TABS: 45.0000 mg | ORAL_TABLET | Freq: Every day | ORAL | 3 refills | Status: DC

## 2016-09-03 NOTE — Progress Notes (Signed)
Dr. Frederico Hamman T. Debra Calabretta, MD, [facility] [place], [address] Phone: [phone] Fax: [phone]  09/03/2016  Patient: Connor Moore, MRN: [id number], DOB: March 26, 1950, 66 y.o.  Primary Physician:  [name], MD   Chief Complaint  Patient presents with  . Diabetes   Subjective:   Connor Moore is a 66 y.o. very pleasant male patient who presents with the following:  F/u DM:  Diabetes Mellitus: Tolerating Medications: yes Compliance with diet: fair Exercise: minimal / intermittent Avg blood sugars at home: not checking Foot problems: none Hypoglycemia: none No nausea, vomitting, blurred vision, polyuria.  Lab Results  Component Value Date   HGBA1C 8.5 (H) 08/27/2016   HGBA1C 9.5 (H) 02/20/2016   HGBA1C 10.1 (H) 10/20/2015   Lab Results  Component Value Date   MICROALBUR 2.3 (H) 08/27/2016   LDLCALC 37 08/27/2016   CREATININE 0.82 08/27/2016    Wt Readings from Last 3 Encounters:  09/03/16 210 lb 8 oz (95.5 kg)  06/18/16 221 lb (100.2 kg)  06/05/16 219 lb 3.2 oz (99.4 kg)    Body mass index is 29.15 kg/m.   HTN: Tolerating all medications without side effects Stable and at goal No CP, no sob. No HA.  BP Readings from Last 3 Encounters:  09/03/16 140/70  06/18/16 (!) 130/97  04/04/16 469/62    Basic Metabolic Panel:    Component Value Date/Time   NA 139 08/27/2016 0832   K 5.2 (H) 08/27/2016 0832   CL 106 08/27/2016 0832   CO2 26 08/27/2016 0832   BUN 15 08/27/2016 0832   CREATININE 0.82 08/27/2016 0832   GLUCOSE 201 (H) 08/27/2016 0832   CALCIUM 9.6 08/27/2016 0832    Lipids: Doing well, stable. Tolerating meds fine with no SE. Panel reviewed with patient.  Lipids:    Component Value Date/Time   CHOL 93 08/27/2016 0832   TRIG 85.0 08/27/2016 0832   HDL 39.80 08/27/2016 0832   LDLDIRECT 77.0 10/20/2015 0749   VLDL 17.0 08/27/2016 0832   CHOLHDL 2 08/27/2016 0832     Lab Results  Component Value Date   ALT 10 08/27/2016   AST 13 08/27/2016   ALKPHOS 56 08/27/2016   BILITOT 0.7 08/27/2016     Past Medical History, Surgical History, Social History, Family History, Problem List, Medications, and Allergies have been reviewed and updated if relevant.  Patient Active Problem List   Diagnosis Date Noted  . Type 2 diabetes, uncontrolled, with neuropathy ([facility]) 04/01/2008    Priority: High  . Diabetic neuropathy ([facility]) 12/07/2010    Priority: Medium  . Hyperlipidemia 02/12/2008    Priority: Medium  . Erectile dysfunction associated with type 2 diabetes mellitus ([facility]) 02/12/2008    Priority: Medium  . Essential hypertension 02/12/2008    Priority: Medium  . 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 Diverticulosis   . Colon polyps   . TOBACCO USE 05/19/2009    Past Medical History:  Diagnosis Date  . Colon polyps   . Diabetes mellitus type II   . Diabetic neuropathy ([facility]) 12/07/2010  . Diverticulosis   . ED (erectile dysfunction)    rare  . HLD (hyperlipidemia)   . HTN (hypertension)   . Obesity     Past Surgical History:  Procedure Laterality Date  . APPENDECTOMY    . INGUINAL HERNIA REPAIR  1974  . KNEE ARTHROSCOPY  1986   Right  .  Left Hydrocele surgery  1974  . REPLACEMENT UNICONDYLAR JOINT KNEE  2009  . TESTICLE REMOVAL  1974   Left  . TONSILLECTOMY    . VASECTOMY  51    Social History   Social History  . Marital status: Married    Spouse name: [name]  . Number of children: [name]  . Years of education: [name]   Occupational History  . General Contractor, [employer]    Social History Main Topics  . Smoking status: Former Smoker    Packs/day: 0.75    Years: 30.00    Types: Cigarettes    Start date: 07/14/2015  . Smokeless tobacco: Never Used     Comment: ? quit 2011  . Alcohol use 0.0 oz/week     Comment: Socially  . Drug use: No  . Sexual activity: Not on file   Other Topics  Concern  . Not on file   Social History Narrative  . No narrative on file    Family History  Problem Relation Age of Onset  . Diabetes Father   . Cancer Father        mouth  . Mental illness Mother        ?   . Diabetes Sister   . Colon cancer Neg Hx   . Esophageal cancer Neg Hx   . Rectal cancer Neg Hx   . Stomach cancer Neg Hx     No Known Allergies  Medication list reviewed and updated in full in [facility].   GEN: No acute illnesses, no fevers, chills. GI: No n/v/d, eating normally Pulm: No SOB Interactive and getting along well at home.  Otherwise, ROS is as per the HPI.  Objective:   BP 140/70   Pulse 86   Temp 98.9 F (37.2 C) (Oral)   Ht 5' 11.25" (1.81 m)   Wt 210 lb 8 oz (95.5 kg)   BMI 29.15 kg/m   GEN: WDWN, NAD, Non-toxic, A & O x 3 HEENT: Atraumatic, Normocephalic. Neck supple. No masses, No LAD. Ears and Nose: No external deformity. CV: RRR, No M/G/R. No JVD. No thrill. No extra heart sounds. PULM: CTA B, no wheezes, crackles, rhonchi. No retractions. No resp. distress. No accessory muscle use. EXTR: No c/c/e NEURO Normal gait.  PSYCH: Normally interactive. Conversant. Not depressed or anxious appearing.  Calm demeanor.   Laboratory and Imaging Data: Results for orders placed or performed in visit on 08/27/16  Lipid panel  Result Value Ref Range   Cholesterol 93 0 - 200 mg/dL   Triglycerides 85.0 0.0 - 149.0 mg/dL   HDL 39.80 >39.00 mg/dL   VLDL 17.0 0.0 - 40.0 mg/dL   LDL Cholesterol 37 0 - 99 mg/dL   Total CHOL/HDL Ratio 2    NonHDL 53.65   Hemoglobin A1c  Result Value Ref Range   Hgb A1c MFr Bld 8.5 (H) 4.6 - 6.5 %  Microalbumin / creatinine urine ratio  Result Value Ref Range   Microalb, Ur 2.3 (H) 0.0 - 1.9 mg/dL   Creatinine,U 262.9 mg/dL   Microalb Creat Ratio 0.9 0.0 - 30.0 mg/g  Basic metabolic panel  Result Value Ref Range   Sodium 139 135 - 145 mEq/L   Potassium 5.2 (H) 3.5 - 5.1 mEq/L   Chloride 106 96 - 112  mEq/L   CO2 26 19 - 32 mEq/L   Glucose, Bld 201 (H) 70 - 99 mg/dL   BUN 15 6 - 23 mg/dL   Creatinine,  Ser 0.82 0.40 - 1.50 mg/dL   Calcium 9.6 8.4 - 10.5 mg/dL   GFR 100.40 >60.00 mL/min  Hepatic function panel  Result Value Ref Range   Total Bilirubin 0.7 0.2 - 1.2 mg/dL   Bilirubin, Direct 0.2 0.0 - 0.3 mg/dL   Alkaline Phosphatase 56 39 - 117 U/L   AST 13 0 - 37 U/L   ALT 10 0 - 53 U/L   Total Protein 6.5 6.0 - 8.3 g/dL   Albumin 4.5 3.5 - 5.2 g/dL  CBC with Differential/Platelet  Result Value Ref Range   WBC 8.8 4.0 - 10.5 K/uL   RBC 4.84 4.22 - 5.81 Mil/uL   Hemoglobin 16.1 13.0 - 17.0 g/dL   HCT 48.5 39.0 - 52.0 %   MCV 100.1 (H) 78.0 - 100.0 fl   MCHC 33.2 30.0 - 36.0 g/dL   RDW 13.6 11.5 - 15.5 %   Platelets 208.0 150.0 - 400.0 K/uL   Neutrophils Relative % 61.0 43.0 - 77.0 %   Lymphocytes Relative 27.9 12.0 - 46.0 %   Monocytes Relative 8.5 3.0 - 12.0 %   Eosinophils Relative 2.1 0.0 - 5.0 %   Basophils Relative 0.5 0.0 - 3.0 %   Neutro Abs 5.4 1.4 - 7.7 K/uL   Lymphs Abs 2.5 0.7 - 4.0 K/uL   Monocytes Absolute 0.8 0.1 - 1.0 K/uL   Eosinophils Absolute 0.2 0.0 - 0.7 K/uL   Basophils Absolute 0.0 0.0 - 0.1 K/uL     Assessment and Plan:   Type 2 diabetes, uncontrolled, with neuropathy ([facility])  Pure hypercholesterolemia  Essential hypertension  Increase actos to 45 mg  All others stay same.  Doing reasonably with goal oral meds only by him  Follow-up: Return in about 6 months (around 03/06/2017) for f/u CPX.  Future Appointments Date Time Provider [place]  03/01/2017 7:35 AM [facility]  03/06/2017 8:30 AM Shiva Karis, Frederico Hamman, MD [facility]    Meds ordered this encounter  Medications  . DISCONTD: pioglitazone (ACTOS) 45 MG tablet    Sig: Take 1 tablet (45 mg total) by mouth daily.    Dispense:  90 tablet    Refill:  3  . pioglitazone (ACTOS) 45 MG tablet    Sig: Take 1 tablet (45 mg total) by mouth daily.     Dispense:  90 tablet    Refill:  3   Medications Discontinued During This Encounter  Medication Reason  . pioglitazone (ACTOS) 30 MG tablet Reorder  . pioglitazone (ACTOS) 45 MG tablet Reorder    Signed,  Frederico Hamman T. Chace Bisch, MD   Allergies as of 09/03/2016   No Known Allergies     Medication List       Accurate as of 09/03/16  2:02 PM. Always use your most recent med list.          aspirin 81 MG tablet Take 81 mg by mouth daily.   atorvastatin 20 MG tablet Commonly known as:  LIPITOR TAKE 1 TABLET BY MOUTH  DAILY   glipiZIDE 10 MG 24 hr tablet Commonly known as:  GLUCOTROL XL TAKE 2 TABLETS BY MOUTH  DAILY WITH BREAKFAST   GLUCOMETER ELITE TEST STRIPS test strip Generic drug:  glucose blood 1 each by Other route 2 (two) times daily. Use as instructed   Lancets 28G Misc by Does not apply route as directed.   metFORMIN 500 MG 24 hr tablet Commonly known as:  GLUCOPHAGE-XR TAKE 4 TABLETS BY MOUTH  DAILY  WITH BREAKFAST   ONGLYZA 5 MG Tabs tablet Generic drug:  saxagliptin HCl TAKE 1 TABLET BY MOUTH  DAILY   pioglitazone 45 MG tablet Commonly known as:  ACTOS Take 1 tablet (45 mg total) by mouth daily.

## 2016-09-14 LAB — HM DIABETES EYE EXAM

## 2016-09-18 ENCOUNTER — Other Ambulatory Visit: Payer: Self-pay | Admitting: Family Medicine

## 2016-09-19 ENCOUNTER — Encounter: Payer: Self-pay | Admitting: Family Medicine

## 2016-11-15 ENCOUNTER — Ambulatory Visit: Payer: 59

## 2017-01-14 ENCOUNTER — Other Ambulatory Visit: Payer: Self-pay | Admitting: Family Medicine

## 2017-01-29 ENCOUNTER — Telehealth: Payer: Self-pay | Admitting: Family Medicine

## 2017-01-29 NOTE — Telephone Encounter (Signed)
Copied from [facility]. Topic: Quick Communication - Rx Refill/Question >> Jan 29, 2017  3:16 PM Scherrie Gerlach wrote: Medication: ONGLYZA 5 MG TABS tablet    Pt starts medicare on 03/15/2017 .  He will loose current insurance at that time . Wife called to advise they cannot find any supplemental insurance company that will cover this drug. They have talked to [facility] and [facility] and neither of them give an alternative to this med.  They would like to know what you suggest

## 2017-01-29 NOTE — Telephone Encounter (Signed)
Spoke with Connor Moore. She was currently driving and ask that I text name of medications to [phone].  Medication names text to Connor Moore as requested.

## 2017-01-29 NOTE — Telephone Encounter (Signed)
Januvia and tradjenta are the other 2 most commonly used. The class is DPP-4 inhibitors

## 2017-03-01 ENCOUNTER — Other Ambulatory Visit: Payer: 59

## 2017-03-01 ENCOUNTER — Other Ambulatory Visit: Payer: Self-pay | Admitting: Family Medicine

## 2017-03-01 DIAGNOSIS — E785 Hyperlipidemia, unspecified: Secondary | ICD-10-CM

## 2017-03-01 DIAGNOSIS — Z79899 Other long term (current) drug therapy: Secondary | ICD-10-CM

## 2017-03-01 DIAGNOSIS — E119 Type 2 diabetes mellitus without complications: Secondary | ICD-10-CM

## 2017-03-01 DIAGNOSIS — Z125 Encounter for screening for malignant neoplasm of prostate: Secondary | ICD-10-CM

## 2017-03-06 ENCOUNTER — Encounter: Payer: Self-pay | Admitting: Family Medicine

## 2017-03-19 ENCOUNTER — Other Ambulatory Visit: Payer: Self-pay

## 2017-03-25 ENCOUNTER — Other Ambulatory Visit (INDEPENDENT_AMBULATORY_CARE_PROVIDER_SITE_OTHER): Payer: Medicare HMO

## 2017-03-25 DIAGNOSIS — E785 Hyperlipidemia, unspecified: Secondary | ICD-10-CM | POA: Diagnosis not present

## 2017-03-25 DIAGNOSIS — E119 Type 2 diabetes mellitus without complications: Secondary | ICD-10-CM

## 2017-03-25 DIAGNOSIS — Z79899 Other long term (current) drug therapy: Secondary | ICD-10-CM | POA: Diagnosis not present

## 2017-03-25 DIAGNOSIS — Z125 Encounter for screening for malignant neoplasm of prostate: Secondary | ICD-10-CM | POA: Diagnosis not present

## 2017-03-25 LAB — HEPATIC FUNCTION PANEL
ALK PHOS: 65 U/L (ref 39–117)
ALT: 12 U/L (ref 0–53)
AST: 10 U/L (ref 0–37)
Albumin: 4.3 g/dL (ref 3.5–5.2)
BILIRUBIN DIRECT: 0.1 mg/dL (ref 0.0–0.3)
Total Bilirubin: 0.6 mg/dL (ref 0.2–1.2)
Total Protein: 6.6 g/dL (ref 6.0–8.3)

## 2017-03-25 LAB — LIPID PANEL
Cholesterol: 127 mg/dL (ref 0–200)
HDL: 37.2 mg/dL — ABNORMAL LOW (ref >39.00)
NONHDL: 89.83
Total CHOL/HDL Ratio: 3
Triglycerides: 240 mg/dL — ABNORMAL HIGH (ref 0.0–149.0)
VLDL: 48 mg/dL — AB (ref 0.0–40.0)

## 2017-03-25 LAB — CBC WITH DIFFERENTIAL/PLATELET
BASOS PCT: 1.3 % (ref 0.0–3.0)
Basophils Absolute: 0.1 10*3/uL (ref 0.0–0.1)
EOS PCT: 2.1 % (ref 0.0–5.0)
Eosinophils Absolute: 0.2 10*3/uL (ref 0.0–0.7)
HCT: 49.2 % (ref 39.0–52.0)
Hemoglobin: 16.7 g/dL (ref 13.0–17.0)
LYMPHS ABS: 1.9 10*3/uL (ref 0.7–4.0)
Lymphocytes Relative: 21.1 % (ref 12.0–46.0)
MCHC: 33.9 g/dL (ref 30.0–36.0)
MCV: 97.3 fl (ref 78.0–100.0)
Monocytes Absolute: 0.9 10*3/uL (ref 0.1–1.0)
Monocytes Relative: 9.5 % (ref 3.0–12.0)
NEUTROS PCT: 66 % (ref 43.0–77.0)
Neutro Abs: 6.1 10*3/uL (ref 1.4–7.7)
PLATELETS: 207 10*3/uL (ref 150.0–400.0)
RBC: 5.06 Mil/uL (ref 4.22–5.81)
RDW: 13.7 % (ref 11.5–15.5)
WBC: 9.2 10*3/uL (ref 4.0–10.5)

## 2017-03-25 LAB — BASIC METABOLIC PANEL
BUN: 16 mg/dL (ref 6–23)
CO2: 27 mEq/L (ref 19–32)
Calcium: 10 mg/dL (ref 8.4–10.5)
Chloride: 106 mEq/L (ref 96–112)
Creatinine, Ser: 0.71 mg/dL (ref 0.40–1.50)
GFR: 118.35 mL/min (ref >60.00)
Glucose, Bld: 220 mg/dL — ABNORMAL HIGH (ref 70–99)
Potassium: 4.7 mEq/L (ref 3.5–5.1)
SODIUM: 139 meq/L (ref 135–145)

## 2017-03-25 LAB — HEMOGLOBIN A1C: Hgb A1c MFr Bld: 10.5 % — ABNORMAL HIGH (ref 4.6–6.5)

## 2017-03-25 LAB — PSA: PSA: 1.11 ng/mL (ref 0.10–4.00)

## 2017-03-25 LAB — LDL CHOLESTEROL, DIRECT: LDL DIRECT: 51 mg/dL

## 2017-03-27 ENCOUNTER — Encounter: Payer: Self-pay | Admitting: Family Medicine

## 2017-04-01 ENCOUNTER — Encounter: Payer: Self-pay | Admitting: Family Medicine

## 2017-04-01 ENCOUNTER — Ambulatory Visit (INDEPENDENT_AMBULATORY_CARE_PROVIDER_SITE_OTHER): Payer: Medicare HMO | Admitting: Family Medicine

## 2017-04-01 ENCOUNTER — Other Ambulatory Visit: Payer: Self-pay

## 2017-04-01 VITALS — BP 162/84 | HR 63 | Temp 98.4°F | Ht 71.25 in | Wt 220.5 lb

## 2017-04-01 DIAGNOSIS — Z0000 Encounter for general adult medical examination without abnormal findings: Secondary | ICD-10-CM

## 2017-04-01 MED ORDER — GLIPIZIDE ER 10 MG PO TB24
20.0000 mg | ORAL_TABLET | Freq: Every day | ORAL | 3 refills | Status: DC
Start: 2017-04-01 — End: 2018-06-02

## 2017-04-01 MED ORDER — ATORVASTATIN CALCIUM 20 MG PO TABS: 20.0000 mg | ORAL_TABLET | Freq: Every day | ORAL | 3 refills | Status: DC

## 2017-04-01 MED ORDER — METFORMIN HCL ER 500 MG PO TB24: 2000.0000 mg | ORAL_TABLET | Freq: Every day | ORAL | 3 refills | Status: DC

## 2017-04-01 MED ORDER — LISINOPRIL 20 MG PO TABS: 20.0000 mg | ORAL_TABLET | Freq: Every day | ORAL | 3 refills | Status: DC

## 2017-04-01 MED ORDER — PIOGLITAZONE HCL 45 MG PO TABS: 45.0000 mg | ORAL_TABLET | Freq: Every day | ORAL | 3 refills | Status: DC

## 2017-04-01 MED ORDER — SITAGLIPTIN PHOSPHATE 100 MG PO TABS: 100.0000 mg | ORAL_TABLET | Freq: Every day | ORAL | 3 refills | Status: DC

## 2017-04-01 NOTE — Progress Notes (Addendum)
Dr. Frederico Hamman T. Baily Serpe, MD, [facility] [place], [address] Phone: [phone] Fax: [phone]  04/01/2017  Patient: Connor Moore, MRN: [id number], DOB: 08-25-1950, 67 y.o.  Primary Physician:  [name], MD   Chief Complaint  Patient presents with  . Welcome to Medicare   Subjective:   Connor Moore is a 67 y.o. pleasant patient who presents for a  Welcome to medicare examination:  Health Maintenance Summary Reviewed and updated, unless pt declines services.  Tobacco History Reviewed. Still smoking - down some Alcohol: No concerns, no excessive use Exercise Habits: Some activity, rec at least 30 mins 5 times a week - building a house STD concerns: no risk or activity to increase risk Drug Use: None Encouraged self-testicular check  Health Maintenance  Topic Date Due  . FOOT EXAM  07/05/2015  . PNA vac Low Risk Adult (1 of 2 - PCV13) 03/30/2017  . INFLUENZA VACCINE  04/14/2017 (Originally 08/15/2016)  . OPHTHALMOLOGY EXAM  09/14/2017  . HEMOGLOBIN A1C  09/25/2017  . COLONOSCOPY  06/19/2019  . TETANUS/TDAP  03/30/2025  . Hepatitis C Screening  Completed  . HIV Screening  Completed    Immunization History  Administered Date(s) Administered  . Tdap 03/31/2015   Diabetes Mellitus: Tolerating Medications: yes, but he has been missing some doses Compliance with diet: fair Exercise: minimal / intermittent Avg blood sugars at home: not checking Foot problems: none Hypoglycemia: none No nausea, vomitting, blurred vision, polyuria.  Lab Results  Component Value Date   HGBA1C 10.5 (H) 03/25/2017   HGBA1C 8.5 (H) 08/27/2016   HGBA1C 9.5 (H) 02/20/2016   Lab Results  Component Value Date   MICROALBUR 2.3 (H) 08/27/2016   LDLCALC 37 08/27/2016   CREATININE 0.71 03/25/2017   HTN:  Never been on BP meds before  BP Readings from Last 3 Encounters:  04/01/17 (!) 162/84  09/03/16 140/70    06/18/16 (!) 681/15    Basic Metabolic Panel:    Component Value Date/Time   NA 139 03/25/2017 0813   K 4.7 03/25/2017 0813   CL 106 03/25/2017 0813   CO2 27 03/25/2017 0813   BUN 16 03/25/2017 0813   CREATININE 0.71 03/25/2017 0813   GLUCOSE 220 (H) 03/25/2017 0813   CALCIUM 10.0 03/25/2017 0813     Patient Active Problem List   Diagnosis Date Noted  . Type 2 diabetes, uncontrolled, with neuropathy ([place]) 04/01/2008    Priority: High  . Diabetic neuropathy ([place]) 12/07/2010    Priority: Medium  . Hyperlipidemia 02/12/2008    Priority: Medium  . Erectile dysfunction associated with type 2 diabetes mellitus ([place]) 02/12/2008    Priority: Medium  . Essential hypertension 02/12/2008    Priority: Medium  . 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 Diverticulosis   . Colon polyps   . TOBACCO USE 05/19/2009   Past Medical History:  Diagnosis Date  . Colon polyps   . Diabetes mellitus type II   . Diabetic neuropathy ([place]) 12/07/2010  . Diverticulosis   . ED (erectile dysfunction)    rare  . HLD (hyperlipidemia)   . HTN (hypertension)   . Obesity    Past Surgical History:  Procedure Laterality Date  . APPENDECTOMY    . INGUINAL HERNIA REPAIR  1974  . KNEE ARTHROSCOPY  1986   Right  . Left Hydrocele surgery  1974  . REPLACEMENT UNICONDYLAR JOINT KNEE  2009  . TESTICLE REMOVAL  1974   Left  . TONSILLECTOMY    . VASECTOMY  1981   Social History   Socioeconomic History  . Marital status: Married    Spouse name: Not on file  . Number of children: Not on file  . Years of education: Not on file  . Highest education level: Not on file  Social Needs  . Financial resource strain: Not on file  . Food insecurity - worry: Not on file  . Food insecurity - inability: Not on file  . Transportation needs - medical: Not on file  . Transportation needs - non-medical: Not on file  Occupational History  . Occupation: Clinical biochemist, [employer]  Tobacco Use  . Smoking status: Current Some Day Smoker    Packs/day: 0.75    Years: 30.00    Pack years: 22.50    Types: Cigarettes    Start date: 07/14/2015  . Smokeless tobacco: Never Used  . Tobacco comment: ? quit 2011  Substance and Sexual Activity  . Alcohol use: Yes    Alcohol/week: 0.0 oz    Comment: Socially  . Drug use: No  . Sexual activity: Not on file  Other Topics Concern  . Not on file  Social History Narrative  . Not on file   Family History  Problem Relation Age of Onset  . Diabetes Father   . Cancer Father        mouth  . Mental illness Mother        ?   . Diabetes Sister   . Colon cancer Neg Hx   . Esophageal cancer Neg Hx   . Rectal cancer Neg Hx   . Stomach cancer Neg Hx    No Known Allergies  Medication list has been reviewed and updated.   General: Denies fever, chills, sweats. No significant weight loss. Eyes: Denies blurring,significant itching ENT: Denies earache, sore throat, and hoarseness. Cardiovascular: Denies chest pains, palpitations, dyspnea on exertion Respiratory: Denies cough, dyspnea at rest,wheeezing Breast: no concerns about lumps GI: Denies nausea, vomiting, diarrhea, constipation, change in bowel habits, abdominal pain, melena, hematochezia GU: Denies penile discharge, ED, urinary flow / outflow problems. No STD concerns. Musculoskeletal: Denies back pain, joint pain Derm: Denies rash, itching Neuro: Denies  paresthesias, frequent falls, frequent headaches Psych: Denies depression, anxiety Endocrine: Denies cold intolerance, heat intolerance, polydipsia Heme: Denies enlarged lymph nodes Allergy: No hayfever  Objective:   BP (!) 162/84   Pulse 63   Temp 98.4 F (36.9 C) (Oral)   Ht 5' 11.25" (1.81 m)   Wt 220 lb 8 oz (100 kg)   BMI 30.54 kg/m   The patient completed a fall screen and PHQ-2 and PHQ-9 if necessary, which is documented in the EHR. The CMA/LPN/RN who assisted the patient verbally completed  with them and documented results in [place].  Hearing Screening Comments: Wears Bilateral Hearing Aides Vision Screening Comments: Eye Exam with Dr. Katy Fitch [place] on 2018  GEN: well developed, well nourished, no acute distress Eyes: conjunctiva and lids normal, PERRLA, EOMI ENT: TM clear, nares clear, oral exam WNL Neck: supple, no lymphadenopathy, no thyromegaly, no JVD Pulm: clear to auscultation and percussion, respiratory effort normal CV: regular rate and rhythm, S1-S2, no murmur, rub or gallop, no bruits, peripheral pulses normal and symmetric, no cyanosis, clubbing, edema or varicosities GI: soft, non-tender; no hepatosplenomegaly, masses; active bowel sounds all quadrants GU: no hernia, testicular mass, penile discharge Lymph: no  cervical, axillary or inguinal adenopathy MSK: gait normal, muscle tone and strength WNL, no joint swelling, effusions, discoloration, crepitus  SKIN: clear, good turgor, color WNL, no rashes, lesions, or ulcerations Neuro: normal mental status, normal strength, sensation, and motion Psych: alert; oriented to person, place and time, normally interactive and not anxious or depressed in appearance.  All labs reviewed with patient.  Lipids:    Component Value Date/Time   CHOL 127 03/25/2017 0813   TRIG 240.0 (H) 03/25/2017 0813   HDL 37.20 (L) 03/25/2017 0813   LDLDIRECT 51.0 03/25/2017 0813   VLDL 48.0 (H) 03/25/2017 0813   CHOLHDL 3 03/25/2017 0813   CBC: CBC Latest Ref Rng & Units 03/25/2017 08/27/2016 10/20/2015  WBC 4.0 - 10.5 K/uL 9.2 8.8 10.1  Hemoglobin 13.0 - 17.0 g/dL 16.7 16.1 17.0  Hematocrit 39.0 - 52.0 % 49.2 48.5 49.6  Platelets 150.0 - 400.0 K/uL 207.0 208.0 826.4    Basic Metabolic Panel:    Component Value Date/Time   NA 139 03/25/2017 0813   K 4.7 03/25/2017 0813   CL 106 03/25/2017 0813   CO2 27 03/25/2017 0813   BUN 16 03/25/2017 0813   CREATININE 0.71 03/25/2017 0813   GLUCOSE 220 (H) 03/25/2017 0813    CALCIUM 10.0 03/25/2017 0813   Hepatic Function Latest Ref Rng & Units 03/25/2017 08/27/2016 10/20/2015  Total Protein 6.0 - 8.3 g/dL 6.6 6.5 6.8  Albumin 3.5 - 5.2 g/dL 4.3 4.5 4.1  AST 0 - 37 U/L 10 13 11   ALT 0 - 53 U/L 12 10 15   Alk Phosphatase 39 - 117 U/L 65 56 82  Total Bilirubin 0.2 - 1.2 mg/dL 0.6 0.7 0.7  Bilirubin, Direct 0.0 - 0.3 mg/dL 0.1 0.2 0.1    No results found for: TSH Lab Results  Component Value Date   PSA 1.11 03/25/2017   PSA 0.94 10/20/2015   PSA 0.77 06/28/2014    Assessment and Plan:   Healthcare maintenance  BS up a lot. Some missed doses, a lot more than normal. I am going to convert to all QHS medication dosing 1 time a day for compliance.   Start on BP meds.   Health Maintenance Exam: The patient's preventative maintenance and recommended screening tests for an annual wellness exam were reviewed in full today. Brought up to date unless services declined.  Counselled on the importance of diet, exercise, and its role in overall health and mortality. The patient's FH and SH was reviewed, including their home life, tobacco status, and drug and alcohol status.  We discussed End of Life Care face to face in the office at the time of the evaluation.   Follow-up in 1 year for physical exam or additional follow-up below.  I have personally reviewed the Medicare Annual Wellness questionnaire and have noted 1. The patient's medical and social history 2. Their use of alcohol, tobacco or illicit drugs 3. Their current medications and supplements 4. The patient's functional ability including ADL's, fall risks, home safety risks and hearing or visual             impairment. 5. Diet and physical activities 6. Evidence for depression or mood disorders 7. Reviewed Updated provider list, see scanned forms and [facility] Snapshot.   The patients weight, height, BMI and visual acuity have been recorded in the chart I have made referrals, counseling and provided  education to the patient based review of the above and I have provided the pt with a written personalized care  plan for preventive services.  I have provided the patient with a copy of your personalized plan for preventive services. Instructed to take the time to review along with their updated medication list.  Follow-up: Return in about 3 months (around 07/02/2017). Or follow-up in 1 year if not noted.  Future Appointments  Date Time Provider [place]  07/08/2017  8:00 AM Kynley Metzger, Frederico Hamman, MD [facility]    Meds ordered this encounter  Medications  . pioglitazone (ACTOS) 45 MG tablet    Sig: Take 1 tablet (45 mg total) by mouth daily.    Dispense:  90 tablet    Refill:  3  . metFORMIN (GLUCOPHAGE-XR) 500 MG 24 hr tablet    Sig: Take 4 tablets (2,000 mg total) by mouth daily with breakfast.    Dispense:  360 tablet    Refill:  3  . glipiZIDE (GLUCOTROL XL) 10 MG 24 hr tablet    Sig: Take 2 tablets (20 mg total) by mouth daily with breakfast.    Dispense:  180 tablet    Refill:  3  . atorvastatin (LIPITOR) 20 MG tablet    Sig: Take 1 tablet (20 mg total) by mouth daily.    Dispense:  90 tablet    Refill:  3  . sitaGLIPtin (JANUVIA) 100 MG tablet    Sig: Take 1 tablet (100 mg total) by mouth daily.    Dispense:  90 tablet    Refill:  3  . lisinopril (PRINIVIL,ZESTRIL) 20 MG tablet    Sig: Take 1 tablet (20 mg total) by mouth daily.    Dispense:  90 tablet    Refill:  3   Medications Discontinued During This Encounter  Medication Reason  . ONGLYZA 5 MG TABS tablet   . pioglitazone (ACTOS) 45 MG tablet Reorder  . metFORMIN (GLUCOPHAGE-XR) 500 MG 24 hr tablet Reorder  . glipiZIDE (GLUCOTROL XL) 10 MG 24 hr tablet Reorder  . atorvastatin (LIPITOR) 20 MG tablet Reorder   No orders of the defined types were placed in this encounter.   Signed,  [name]. Ahnesti Townsend, MD   Allergies as of 04/01/2017   No Known Allergies     Medication List        Accurate as  of 04/01/17 11:59 PM. Always use your most recent med list.          aspirin 81 MG tablet Take 81 mg by mouth daily.   atorvastatin 20 MG tablet Commonly known as:  LIPITOR Take 1 tablet (20 mg total) by mouth daily.   glipiZIDE 10 MG 24 hr tablet Commonly known as:  GLUCOTROL XL Take 2 tablets (20 mg total) by mouth daily with breakfast.   GLUCOMETER ELITE TEST STRIPS test strip Generic drug:  glucose blood 1 each by Other route 2 (two) times daily. Use as instructed   Lancets 28G Misc by Does not apply route as directed.   lisinopril 20 MG tablet Commonly known as:  PRINIVIL,ZESTRIL Take 1 tablet (20 mg total) by mouth daily.   metFORMIN 500 MG 24 hr tablet Commonly known as:  GLUCOPHAGE-XR Take 4 tablets (2,000 mg total) by mouth daily with breakfast.   pioglitazone 45 MG tablet Commonly known as:  ACTOS Take 1 tablet (45 mg total) by mouth daily.   sitaGLIPtin 100 MG tablet Commonly known as:  JANUVIA Take 1 tablet (100 mg total) by mouth daily.

## 2017-04-10 DIAGNOSIS — R69 Illness, unspecified: Secondary | ICD-10-CM | POA: Diagnosis not present

## 2017-04-30 ENCOUNTER — Other Ambulatory Visit: Payer: Self-pay | Admitting: Family Medicine

## 2017-06-11 DIAGNOSIS — R69 Illness, unspecified: Secondary | ICD-10-CM | POA: Diagnosis not present

## 2017-07-07 NOTE — Progress Notes (Signed)
Dr. Frederico Hamman T. Denecia Brunette, MD, [facility] [place], [address] Phone: [phone] Fax: [phone]  07/08/2017  Patient: Connor Moore, MRN: [id number], DOB: Dec 30, 1950, 67 y.o.  Primary Physician:  [name], MD   Cc: f/u long term DM  Subjective:   Connor Moore is a 67 y.o. very pleasant male patient who presents with the following:  Connor Moore is here in follow-up regarding his diabetes, high blood pressure, and high cholesterol.  Historically, this had been under fairly poor control release in terms of his diabetes, but he had been doing quite a bit better in terms of his compliance.  Quit smoking for 2 weeks  Diabetes Mellitus: Tolerating Medications: yes Compliance with diet: fair Exercise: minimal / intermittent Avg blood sugars at home: not checking Foot problems: none Hypoglycemia: none No nausea, vomitting, blurred vision, polyuria.  Lab Results  Component Value Date   HGBA1C 8.8 (A) 07/08/2017   HGBA1C 10.5 (H) 03/25/2017   HGBA1C 8.5 (H) 08/27/2016   Lab Results  Component Value Date   MICROALBUR 2.3 (H) 08/27/2016   LDLCALC 37 08/27/2016   CREATININE 0.71 03/25/2017    Wt Readings from Last 3 Encounters:  07/08/17 215 lb 12 oz (97.9 kg)  04/01/17 220 lb 8 oz (100 kg)  09/03/16 210 lb 8 oz (95.5 kg)     Lipids: Doing well, stable. Tolerating meds fine with no SE. Panel reviewed with patient.  Lipids:    Component Value Date/Time   CHOL 127 03/25/2017 0813   TRIG 240.0 (H) 03/25/2017 0813   HDL 37.20 (L) 03/25/2017 0813   LDLDIRECT 51.0 03/25/2017 0813   VLDL 48.0 (H) 03/25/2017 0813   CHOLHDL 3 03/25/2017 0813    Lab Results  Component Value Date   ALT 12 03/25/2017   AST 10 03/25/2017   ALKPHOS 65 03/25/2017   BILITOT 0.6 03/25/2017    HTN: Tolerating all medications without side effects Stable and at goal No CP, no sob. No HA.  BP Readings from Last 3  Encounters:  07/08/17 110/64  04/01/17 (!) 162/84  09/03/16 259/56    Basic Metabolic Panel:    Component Value Date/Time   NA 139 03/25/2017 0813   K 4.7 03/25/2017 0813   CL 106 03/25/2017 0813   CO2 27 03/25/2017 0813   BUN 16 03/25/2017 0813   CREATININE 0.71 03/25/2017 0813   GLUCOSE 220 (H) 03/25/2017 0813   CALCIUM 10.0 03/25/2017 0813     Past Medical History, Surgical History, Social History, Family History, Problem List, Medications, and Allergies have been reviewed and updated if relevant.  Patient Active Problem List   Diagnosis Date Noted  . Type 2 diabetes, uncontrolled, with neuropathy ([place]) 04/01/2008    Priority: High  . Diabetic neuropathy ([place]) 12/07/2010    Priority: Medium  . Hyperlipidemia 02/12/2008    Priority: Medium  . Erectile dysfunction associated with type 2 diabetes mellitus ([place]) 02/12/2008    Priority: Medium  . Essential hypertension 02/12/2008    Priority: Medium  . 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 Diverticulosis   . Colon polyps   . TOBACCO USE 05/19/2009    Past Medical History:  Diagnosis Date  . Colon polyps   . Diabetes mellitus type II   . Diabetic neuropathy ([place]) 12/07/2010  . Diverticulosis   . ED (erectile dysfunction)    rare  . HLD (hyperlipidemia)   .  HTN (hypertension)   . Obesity     Past Surgical History:  Procedure Laterality Date  . APPENDECTOMY    . INGUINAL HERNIA REPAIR  1974  . KNEE ARTHROSCOPY  1986   Right  . Left Hydrocele surgery  1974  . REPLACEMENT UNICONDYLAR JOINT KNEE  2009  . TESTICLE REMOVAL  1974   Left  . TONSILLECTOMY    . VASECTOMY  1981    Social History   Socioeconomic History  . Marital status: Married    Spouse name: Not on file  . Number of children: Not on file  . Years of education: Not on file  . Highest education level: Not on file  Occupational History  . Occupation: Clinical biochemist, [employer]  .  Financial resource strain: Not on file  . Food insecurity:    Worry: Not on file    Inability: Not on file  . Transportation needs:    Medical: Not on file    Non-medical: Not on file  Tobacco Use  . Smoking status: Current Some Day Smoker    Packs/day: 0.75    Years: 30.00    Pack years: 22.50    Types: Cigarettes    Start date: 07/14/2015  . Smokeless tobacco: Never Used  . Tobacco comment: ? quit 2011  Substance and Sexual Activity  . Alcohol use: Yes    Alcohol/week: 0.0 oz    Comment: Socially  . Drug use: No  . Sexual activity: Not on file  Lifestyle  . Physical activity:    Days per week: Not on file    Minutes per session: Not on file  . Stress: Not on file  Relationships  . Social connections:    Talks on phone: Not on file    Gets together: Not on file    Attends religious service: Not on file    Active member of club or organization: Not on file    Attends meetings of clubs or organizations: Not on file    Relationship status: Not on file  . Intimate partner violence:    Fear of current or ex partner: Not on file    Emotionally abused: Not on file    Physically abused: Not on file    Forced sexual activity: Not on file  Other Topics Concern  . Not on file  Social History Narrative  . Not on file    Family History  Problem Relation Age of Onset  . Diabetes Father   . Cancer Father        mouth  . Mental illness Mother        ?   . Diabetes Sister   . Colon cancer Neg Hx   . Esophageal cancer Neg Hx   . Rectal cancer Neg Hx   . Stomach cancer Neg Hx     No Known Allergies  Medication list reviewed and updated in full in [place].   GEN: No acute illnesses, no fevers, chills. GI: No n/v/d, eating normally Pulm: No SOB Interactive and getting along well at home.  Otherwise, ROS is as per the HPI.  Objective:   BP 110/64   Pulse 67   Temp 98.5 F (36.9 C) (Oral)   Ht 5' 11.25" (1.81 m)   Wt 215 lb 12 oz (97.9 kg)   BMI 29.88  kg/m   GEN: WDWN, NAD, Non-toxic, A & O x 3 HEENT: Atraumatic, Normocephalic. Neck supple. No masses, No LAD. Ears  and Nose: No external deformity. CV: RRR, No M/G/R. No JVD. No thrill. No extra heart sounds. PULM: CTA B, no wheezes, crackles, rhonchi. No retractions. No resp. distress. No accessory muscle use. EXTR: No c/c/e NEURO Normal gait.  PSYCH: Normally interactive. Conversant. Not depressed or anxious appearing.  Calm demeanor.   Laboratory and Imaging Data: Results for orders placed or performed in visit on 07/08/17  POCT glycosylated hemoglobin (Hb A1C)  Result Value Ref Range   Hemoglobin A1C 8.8 (A) 4.0 - 5.6 %   HbA1c POC (<> result, manual entry)  4.0 - 5.6 %   HbA1c, POC (prediabetic range)  5.7 - 6.4 %   HbA1c, POC (controlled diabetic range)  0.0 - 7.0 %     Assessment and Plan:   Diabetes mellitus without complication ([place]) - Plan: POCT glycosylated hemoglobin (Hb A1C)  a1c is improved but not at goal He is not ready to go on insulin Recheck in 3 mo  Follow-up: Return in about 3 months (around 10/08/2017).  Orders Placed This Encounter  Procedures  . POCT glycosylated hemoglobin (Hb A1C)    Signed,  Sparsh Callens T. Annalysse Shoemaker, MD   Allergies as of 07/08/2017   No Known Allergies     Medication List        Accurate as of 07/08/17  8:35 AM. Always use your most recent med list.          aspirin 81 MG tablet Take 81 mg by mouth daily.   atorvastatin 20 MG tablet Commonly known as:  LIPITOR Take 1 tablet (20 mg total) by mouth daily.   glipiZIDE 10 MG 24 hr tablet Commonly known as:  GLUCOTROL XL Take 2 tablets (20 mg total) by mouth daily with breakfast.   GLUCOMETER ELITE TEST STRIPS test strip Generic drug:  glucose blood 1 each by Other route 2 (two) times daily. Use as instructed   Lancets 28G Misc by Does not apply route as directed.   lisinopril 20 MG tablet Commonly known as:  PRINIVIL,ZESTRIL Take 1 tablet (20 mg total) by mouth  daily.   metFORMIN 500 MG 24 hr tablet Commonly known as:  GLUCOPHAGE-XR Take 4 tablets (2,000 mg total) by mouth daily with breakfast.   pioglitazone 45 MG tablet Commonly known as:  ACTOS Take 1 tablet (45 mg total) by mouth daily.   sitaGLIPtin 100 MG tablet Commonly known as:  JANUVIA Take 1 tablet (100 mg total) by mouth daily.

## 2017-07-08 ENCOUNTER — Encounter: Payer: Self-pay | Admitting: Family Medicine

## 2017-07-08 ENCOUNTER — Ambulatory Visit (INDEPENDENT_AMBULATORY_CARE_PROVIDER_SITE_OTHER): Payer: Medicare HMO | Admitting: Family Medicine

## 2017-07-08 VITALS — BP 110/64 | HR 67 | Temp 98.5°F | Ht 71.25 in | Wt 215.8 lb

## 2017-07-08 DIAGNOSIS — E119 Type 2 diabetes mellitus without complications: Secondary | ICD-10-CM

## 2017-07-08 LAB — POCT GLYCOSYLATED HEMOGLOBIN (HGB A1C): Hemoglobin A1C: 8.8 % — AB (ref 4.0–5.6)

## 2017-08-19 DIAGNOSIS — H2513 Age-related nuclear cataract, bilateral: Secondary | ICD-10-CM | POA: Diagnosis not present

## 2017-08-19 DIAGNOSIS — H33191 Other retinoschisis and retinal cysts, right eye: Secondary | ICD-10-CM | POA: Diagnosis not present

## 2017-08-19 DIAGNOSIS — E119 Type 2 diabetes mellitus without complications: Secondary | ICD-10-CM | POA: Diagnosis not present

## 2017-08-19 DIAGNOSIS — H35351 Cystoid macular degeneration, right eye: Secondary | ICD-10-CM | POA: Diagnosis not present

## 2017-08-22 DIAGNOSIS — E133211 Other specified diabetes mellitus with mild nonproliferative diabetic retinopathy with macular edema, right eye: Secondary | ICD-10-CM | POA: Diagnosis not present

## 2017-08-22 DIAGNOSIS — H25813 Combined forms of age-related cataract, bilateral: Secondary | ICD-10-CM | POA: Diagnosis not present

## 2017-08-22 DIAGNOSIS — E113292 Type 2 diabetes mellitus with mild nonproliferative diabetic retinopathy without macular edema, left eye: Secondary | ICD-10-CM | POA: Diagnosis not present

## 2017-08-22 DIAGNOSIS — H35363 Drusen (degenerative) of macula, bilateral: Secondary | ICD-10-CM | POA: Diagnosis not present

## 2017-08-30 DIAGNOSIS — H2512 Age-related nuclear cataract, left eye: Secondary | ICD-10-CM | POA: Diagnosis not present

## 2017-09-10 ENCOUNTER — Encounter: Payer: Self-pay | Admitting: Family Medicine

## 2017-09-10 NOTE — Progress Notes (Signed)
Dr. Frederico Hamman T. Konstantinos Cordoba, MD, [facility] Primary Care and Sports Medicine [place], [address] Phone: [phone] Fax: [phone]  09/11/2017  Patient: Connor Moore, MRN: [id number], DOB: [birth year], 67 y.o.  Primary Physician:  [name], MD   Chief Complaint  Patient presents with  . Cough    stopped smoking 08/18/17  . Nasal Congestion   Subjective:   Connor Moore is a 67 y.o. very pleasant male patient who presents with the following:  Nose has been running for about 2 weeks, then not coughing up anything. Now coughing up all the time.  He is globally not been feeling well at all for the last few weeks.  Of note, he did quit smoking cigarettes about 3 weeks ago.  He has not had any fevers, but he has intermittently been coughing up some chunky mucus.  Itchy eyes. Watery.  Not tried anything over the counter.   Past Medical History, Surgical History, Social History, Family History, Problem List, Medications, and Allergies have been reviewed and updated if relevant.  Patient Active Problem List   Diagnosis Date Noted  . Type 2 diabetes, uncontrolled, with neuropathy ([place]) 04/01/2008    Priority: High  . Diabetic neuropathy ([place]) 12/07/2010    Priority: Medium  . Hyperlipidemia 02/12/2008    Priority: Medium  . Erectile dysfunction associated with type 2 diabetes mellitus ([place]) 02/12/2008    Priority: Medium  . Essential hypertension 02/12/2008    Priority: Medium  . 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 Diverticulosis   . Colon polyps   . TOBACCO USE 05/19/2009    Past Medical History:  Diagnosis Date  . Colon polyps   . Diabetes mellitus type II   . Diabetic neuropathy ([place]) 12/07/2010  . Diverticulosis   . ED (erectile dysfunction)    rare  . HLD (hyperlipidemia)   . HTN (hypertension)   . Obesity     Past Surgical History:  Procedure Laterality Date  . APPENDECTOMY    .  INGUINAL HERNIA REPAIR  1974  . KNEE ARTHROSCOPY  1986   Right  . Left Hydrocele surgery  1974  . REPLACEMENT UNICONDYLAR JOINT KNEE  2009  . TESTICLE REMOVAL  1974   Left  . TONSILLECTOMY    . VASECTOMY  1981    Social History   Socioeconomic History  . Marital status: Married    Spouse name: Not on file  . Number of children: Not on file  . Years of education: Not on file  . Highest education level: Not on file  Occupational History  . Occupation: Clinical biochemist, [employer]  . Financial resource strain: Not on file  . Food insecurity:    Worry: Not on file    Inability: Not on file  . Transportation needs:    Medical: Not on file    Non-medical: Not on file  Tobacco Use  . Smoking status: Former Smoker    Packs/day: 0.75    Years: 30.00    Pack years: 22.50    Types: Cigarettes    Start date: 08/18/2017  . Smokeless tobacco: Never Used  . Tobacco comment: ? quit 2011  Substance and Sexual Activity  . Alcohol use: Yes    Alcohol/week: 0.0 standard drinks    Comment: Socially  . Drug use: No  . Sexual activity: Not on file  Lifestyle  . Physical activity:    Days  per week: Not on file    Minutes per session: Not on file  . Stress: Not on file  Relationships  . Social connections:    Talks on phone: Not on file    Gets together: Not on file    Attends religious service: Not on file    Active member of club or organization: Not on file    Attends meetings of clubs or organizations: Not on file    Relationship status: Not on file  . Intimate partner violence:    Fear of current or ex partner: Not on file    Emotionally abused: Not on file    Physically abused: Not on file    Forced sexual activity: Not on file  Other Topics Concern  . Not on file  Social History Narrative  . Not on file    Family History  Problem Relation Age of Onset  . Diabetes Father   . Cancer Father        mouth  . Mental illness Mother        ?   .  Diabetes Sister   . Colon cancer Neg Hx   . Esophageal cancer Neg Hx   . Rectal cancer Neg Hx   . Stomach cancer Neg Hx     No Known Allergies  Medication list reviewed and updated in full in [address].  ROS: GEN: Acute illness details above GI: Tolerating PO intake GU: maintaining adequate hydration and urination Pulm: No SOB Interactive and getting along well at home.  Otherwise, ROS is as per the HPI.  Objective:   BP 120/70   Pulse 60   Temp 98.3 F (36.8 C) (Oral)   Ht 5' 11.25" (1.81 m)   SpO2 96%   BMI 29.88 kg/m    GEN: A and O x 3. WDWN. NAD.    ENT: Nose clear, ext NML.  No LAD.  No JVD.  TM's clear. Oropharynx clear.  PULM: Normal WOB, no distress. No crackles, wheezes, b bases with rhonchi. CV: RRR, no M/G/R, No rubs, No JVD.   EXT: warm and well-perfused, No c/c/e. PSYCH: Pleasant and conversant.    Laboratory and Imaging Data:  Assessment and Plan:   Acute bronchitis, unspecified organism  History of cigarette smoking  Acute bronchitis: discussed plan of care. Given length of symptoms and overall history, will treat with ABX in this case. Continue with additional supportive care, cough medications, liquids, sleep, steam / vaporizer.   Follow-up: No follow-ups on file.  Meds ordered this encounter  Medications  . azithromycin (ZITHROMAX) 250 MG tablet    Sig: 2 tabs po on day 1, then 1 tab po for 4 days    Dispense:  6 tablet    Refill:  0    Signed,  Alok Minshall T. Kisa Fujii, MD   Allergies as of 09/11/2017   No Known Allergies     Medication List        Accurate as of 09/11/17 11:59 PM. Always use your most recent med list.          aspirin 81 MG tablet Take 81 mg by mouth daily.   atorvastatin 20 MG tablet Commonly known as:  LIPITOR Take 1 tablet (20 mg total) by mouth daily.   azithromycin 250 MG tablet Commonly known as:  ZITHROMAX 2 tabs po on day 1, then 1 tab po for 4 days   glipiZIDE 10 MG 24 hr  tablet Commonly known as:  GLUCOTROL XL  Take 2 tablets (20 mg total) by mouth daily with breakfast.   GLUCOMETER ELITE TEST STRIPS test strip Generic drug:  glucose blood 1 each by Other route 2 (two) times daily. Use as instructed   Lancets 28G Misc by Does not apply route as directed.   lisinopril 20 MG tablet Commonly known as:  PRINIVIL,ZESTRIL Take 1 tablet (20 mg total) by mouth daily.   metFORMIN 500 MG 24 hr tablet Commonly known as:  GLUCOPHAGE-XR Take 4 tablets (2,000 mg total) by mouth daily with breakfast.   pioglitazone 45 MG tablet Commonly known as:  ACTOS Take 1 tablet (45 mg total) by mouth daily.   sitaGLIPtin 100 MG tablet Commonly known as:  JANUVIA Take 1 tablet (100 mg total) by mouth daily.

## 2017-09-11 ENCOUNTER — Ambulatory Visit (INDEPENDENT_AMBULATORY_CARE_PROVIDER_SITE_OTHER): Payer: Medicare HMO | Admitting: Family Medicine

## 2017-09-11 ENCOUNTER — Encounter: Payer: Self-pay | Admitting: Family Medicine

## 2017-09-11 VITALS — BP 120/70 | HR 60 | Temp 98.3°F | Ht 71.25 in

## 2017-09-11 DIAGNOSIS — Z87891 Personal history of nicotine dependence: Secondary | ICD-10-CM | POA: Diagnosis not present

## 2017-09-11 DIAGNOSIS — J209 Acute bronchitis, unspecified: Secondary | ICD-10-CM | POA: Diagnosis not present

## 2017-09-11 MED ORDER — AZITHROMYCIN 250 MG PO TABS
ORAL_TABLET | ORAL | 0 refills | Status: DC
Start: 2017-09-11 — End: 2017-10-14

## 2017-09-11 NOTE — Patient Instructions (Signed)
Get an over the counter allergy medication:  Like Allegra or Zyrtec

## 2017-09-13 ENCOUNTER — Encounter: Payer: Self-pay | Admitting: Family Medicine

## 2017-09-25 DIAGNOSIS — H25812 Combined forms of age-related cataract, left eye: Secondary | ICD-10-CM | POA: Diagnosis not present

## 2017-09-25 DIAGNOSIS — H2512 Age-related nuclear cataract, left eye: Secondary | ICD-10-CM | POA: Diagnosis not present

## 2017-10-02 ENCOUNTER — Other Ambulatory Visit: Payer: Self-pay | Admitting: Family Medicine

## 2017-10-07 DIAGNOSIS — H2511 Age-related nuclear cataract, right eye: Secondary | ICD-10-CM | POA: Diagnosis not present

## 2017-10-09 DIAGNOSIS — H21561 Pupillary abnormality, right eye: Secondary | ICD-10-CM | POA: Diagnosis not present

## 2017-10-09 DIAGNOSIS — H25811 Combined forms of age-related cataract, right eye: Secondary | ICD-10-CM | POA: Diagnosis not present

## 2017-10-09 DIAGNOSIS — H2511 Age-related nuclear cataract, right eye: Secondary | ICD-10-CM | POA: Diagnosis not present

## 2017-10-14 ENCOUNTER — Encounter: Payer: Self-pay | Admitting: Family Medicine

## 2017-10-14 ENCOUNTER — Ambulatory Visit (INDEPENDENT_AMBULATORY_CARE_PROVIDER_SITE_OTHER): Payer: Medicare HMO | Admitting: Family Medicine

## 2017-10-14 DIAGNOSIS — IMO0002 Reserved for concepts with insufficient information to code with codable children: Secondary | ICD-10-CM

## 2017-10-14 DIAGNOSIS — E1165 Type 2 diabetes mellitus with hyperglycemia: Secondary | ICD-10-CM

## 2017-10-14 DIAGNOSIS — E1140 Type 2 diabetes mellitus with diabetic neuropathy, unspecified: Secondary | ICD-10-CM | POA: Diagnosis not present

## 2017-10-14 LAB — POCT GLYCOSYLATED HEMOGLOBIN (HGB A1C): Hemoglobin A1C: 8.5 % — AB (ref 4.0–5.6)

## 2017-10-14 NOTE — Progress Notes (Signed)
Dr. Frederico Hamman T. Jeshua Ransford, MD, [facility] Primary Care and Sports Medicine [place], [address] Phone: [phone] Fax: [phone]  10/14/2017  Patient: Connor Moore, MRN: [id number], DOB: 1950/02/29, 67 y.o.  Primary Physician:  [name], MD   Chief Complaint  Patient presents with  . Diabetes   Subjective:   Connor Moore is a 67 y.o. very pleasant male patient who presents with the following:  Diabetes Mellitus: Tolerating Medications: yes Compliance with diet: fair Exercise: minimal / intermittent Avg blood sugars at home: not checking Foot problems: none Hypoglycemia: none No nausea, vomitting, blurred vision, polyuria.  Cataract.   Lab Results  Component Value Date   HGBA1C 8.5 (A) 10/14/2017   HGBA1C 8.8 (A) 07/08/2017   HGBA1C 10.5 (H) 03/25/2017   Lab Results  Component Value Date   MICROALBUR 2.3 (H) 08/27/2016   LDLCALC 37 08/27/2016   CREATININE 0.71 03/25/2017    Wt Readings from Last 3 Encounters:  10/14/17 230 lb (104.3 kg)  07/08/17 215 lb 12 oz (97.9 kg)  04/01/17 220 lb 8 oz (100 kg)    Body mass index is 31.85 kg/m.   Past Medical History, Surgical History, Social History, Family History, Problem List, Medications, and Allergies have been reviewed and updated if relevant.  Patient Active Problem List   Diagnosis Date Noted  . Type 2 diabetes, uncontrolled, with neuropathy ([place]) 04/01/2008    Priority: High  . Diabetic neuropathy ([place]) 12/07/2010    Priority: Medium  . Hyperlipidemia 02/12/2008    Priority: Medium  . Erectile dysfunction associated with type 2 diabetes mellitus ([place]) 02/12/2008    Priority: Medium  . Essential hypertension 02/12/2008    Priority: Medium  . 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 Diverticulosis   . Colon polyps   . TOBACCO USE 05/19/2009    Past Medical History:  Diagnosis Date  . Colon polyps   . Diabetes mellitus  type II   . Diabetic neuropathy ([place]) 12/07/2010  . Diverticulosis   . ED (erectile dysfunction)    rare  . HLD (hyperlipidemia)   . HTN (hypertension)   . Obesity     Past Surgical History:  Procedure Laterality Date  . APPENDECTOMY    . INGUINAL HERNIA REPAIR  1974  . KNEE ARTHROSCOPY  1986   Right  . Left Hydrocele surgery  1974  . REPLACEMENT UNICONDYLAR JOINT KNEE  2009  . TESTICLE REMOVAL  1974   Left  . TONSILLECTOMY    . VASECTOMY  1981    Social History   Socioeconomic History  . Marital status: Married    Spouse name: Not on file  . Number of children: Not on file  . Years of education: Not on file  . Highest education level: Not on file  Occupational History  . Occupation: Clinical biochemist, [employer]  . Financial resource strain: Not on file  . Food insecurity:    Worry: Not on file    Inability: Not on file  . Transportation needs:    Medical: Not on file    Non-medical: Not on file  Tobacco Use  . Smoking status: Former Moore    Packs/day: 0.75    Years: 30.00    Pack years: 22.50    Types: Cigarettes    Start date: 08/18/2017  . Smokeless tobacco: Never Used  . Tobacco comment: ? quit 2011  Substance and Sexual Activity  .  Alcohol use: Yes    Alcohol/week: 0.0 standard drinks    Comment: Socially  . Drug use: No  . Sexual activity: Not on file  Lifestyle  . Physical activity:    Days per week: Not on file    Minutes per session: Not on file  . Stress: Not on file  Relationships  . Social connections:    Talks on phone: Not on file    Gets together: Not on file    Attends religious service: Not on file    Active member of club or organization: Not on file    Attends meetings of clubs or organizations: Not on file    Relationship status: Not on file  . Intimate partner violence:    Fear of current or ex partner: Not on file    Emotionally abused: Not on file    Physically abused: Not on file    Forced sexual  activity: Not on file  Other Topics Concern  . Not on file  Social History Narrative  . Not on file    Family History  Problem Relation Age of Onset  . Diabetes Father   . Cancer Father        mouth  . Mental illness Mother        ?   . Diabetes Sister   . Colon cancer Neg Hx   . Esophageal cancer Neg Hx   . Rectal cancer Neg Hx   . Stomach cancer Neg Hx     No Known Allergies  Medication list reviewed and updated in full in [place].   GEN: No acute illnesses, no fevers, chills. GI: No n/v/d, eating normally Pulm: No SOB Interactive and getting along well at home.  Otherwise, ROS is as per the HPI.  Objective:   BP 130/70   Pulse 67   Temp 97.8 F (36.6 C) (Oral)   Ht 5' 11.25" (1.81 m)   Wt 230 lb (104.3 kg)   BMI 31.85 kg/m   GEN: WDWN, NAD, Non-toxic, A & O x 3 HEENT: Atraumatic, Normocephalic. Neck supple. No masses, No LAD. Ears and Nose: No external deformity. CV: RRR, No M/G/R. No JVD. No thrill. No extra heart sounds. PULM: CTA B, no wheezes, crackles, rhonchi. No retractions. No resp. distress. No accessory muscle use. EXTR: No c/c/e NEURO Normal gait.  PSYCH: Normally interactive. Conversant. Not depressed or anxious appearing.  Calm demeanor.   Laboratory and Imaging Data:  Assessment and Plan:   Type 2 diabetes, uncontrolled, with neuropathy ([place]) - Plan: POCT glycosylated hemoglobin (Hb A1C)  Improving - will work on diet  Follow-up: Return in about 3 months (around 01/13/2018).  Orders Placed This Encounter  Procedures  . POCT glycosylated hemoglobin (Hb A1C)    Signed,  Shirlee Whitmire T. Shia Delaine, MD   Allergies as of 10/14/2017   No Known Allergies     Medication List        Accurate as of 10/14/17  8:58 AM. Always use your most recent med list.          aspirin 81 MG tablet Take 81 mg by mouth daily.   atorvastatin 20 MG tablet Commonly known as:  LIPITOR Take 1 tablet (20 mg total) by mouth daily.   glipiZIDE  10 MG 24 hr tablet Commonly known as:  GLUCOTROL XL Take 2 tablets (20 mg total) by mouth daily with breakfast.   GLUCOMETER ELITE TEST STRIPS test strip Generic drug:  glucose blood 1 each  by Other route 2 (two) times daily. Use as instructed   ketorolac 0.5 % ophthalmic solution Commonly known as:  ACULAR INSTILL 1 DROP INTO LEFT EYE FOUR TIMES A DAY STARTING 1 DAY BEFORE SURGERY   Lancets 28G Misc by Does not apply route as directed.   lisinopril 20 MG tablet Commonly known as:  PRINIVIL,ZESTRIL Take 1 tablet (20 mg total) by mouth daily.   metFORMIN 500 MG 24 hr tablet Commonly known as:  GLUCOPHAGE-XR Take 4 tablets (2,000 mg total) by mouth daily with breakfast.   pioglitazone 45 MG tablet Commonly known as:  ACTOS Take 1 tablet (45 mg total) by mouth daily.   sildenafil 20 MG tablet Commonly known as:  REVATIO TAKE 2 TO 5 TABLETS BY MOUTH 30 MINUTES PRIOR TO INTERCOURSE   sitaGLIPtin 100 MG tablet Commonly known as:  JANUVIA Take 1 tablet (100 mg total) by mouth daily.

## 2018-01-20 ENCOUNTER — Ambulatory Visit: Payer: Medicare HMO | Admitting: Family Medicine

## 2018-04-02 ENCOUNTER — Ambulatory Visit: Payer: Medicare HMO | Admitting: Family Medicine

## 2018-04-10 ENCOUNTER — Other Ambulatory Visit: Payer: Self-pay | Admitting: Family Medicine

## 2018-04-30 ENCOUNTER — Telehealth: Payer: Self-pay | Admitting: Family Medicine

## 2018-04-30 NOTE — Telephone Encounter (Signed)
Left message asking pt to call office please see if pt can do doxy me appointment with dr copland with a1c prior to appointment curbside

## 2018-05-02 ENCOUNTER — Telehealth: Payer: Self-pay

## 2018-05-02 NOTE — Telephone Encounter (Signed)
Copied from [place] [phone]. Topic: Quick Communication - See Telephone Encounter >> May 01, 2018  4:54 PM Loma Boston wrote: CRM for notification. See Telephone encounter for: 05/01/18. Ptwife , Helene Kelp called in about the appt next week 4/20 and wants to Resh sometimes in May COVID please FU with her at [phone]

## 2018-05-02 NOTE — Telephone Encounter (Signed)
Spouse  r/s to 5/18 she is aware because covid19 pt may need to be r/s again

## 2018-05-05 ENCOUNTER — Ambulatory Visit: Payer: Medicare HMO | Admitting: Family Medicine

## 2018-05-25 ENCOUNTER — Other Ambulatory Visit: Payer: Self-pay | Admitting: Family Medicine

## 2018-05-30 ENCOUNTER — Other Ambulatory Visit: Payer: Self-pay | Admitting: Family Medicine

## 2018-05-30 ENCOUNTER — Telehealth: Payer: Self-pay | Admitting: Family Medicine

## 2018-05-30 DIAGNOSIS — E1140 Type 2 diabetes mellitus with diabetic neuropathy, unspecified: Secondary | ICD-10-CM

## 2018-05-30 DIAGNOSIS — IMO0002 Reserved for concepts with insufficient information to code with codable children: Secondary | ICD-10-CM

## 2018-05-30 NOTE — Telephone Encounter (Signed)
Left message asking pt to call office  Virtual is fine, but I have to get an a1c before appointment.   Terri, can you help me with that? (He is retired he could probably come today)

## 2018-05-31 ENCOUNTER — Other Ambulatory Visit: Payer: Self-pay | Admitting: Family Medicine

## 2018-05-31 DIAGNOSIS — E1140 Type 2 diabetes mellitus with diabetic neuropathy, unspecified: Secondary | ICD-10-CM

## 2018-05-31 DIAGNOSIS — N138 Other obstructive and reflux uropathy: Secondary | ICD-10-CM

## 2018-05-31 DIAGNOSIS — IMO0002 Reserved for concepts with insufficient information to code with codable children: Secondary | ICD-10-CM

## 2018-05-31 DIAGNOSIS — E785 Hyperlipidemia, unspecified: Secondary | ICD-10-CM

## 2018-05-31 DIAGNOSIS — N401 Enlarged prostate with lower urinary tract symptoms: Secondary | ICD-10-CM

## 2018-05-31 DIAGNOSIS — Z79899 Other long term (current) drug therapy: Secondary | ICD-10-CM

## 2018-05-31 DIAGNOSIS — Z125 Encounter for screening for malignant neoplasm of prostate: Secondary | ICD-10-CM

## 2018-06-02 ENCOUNTER — Ambulatory Visit: Payer: Medicare HMO | Admitting: Family Medicine

## 2018-06-02 NOTE — Telephone Encounter (Signed)
Pt r/s appointment to june

## 2018-06-02 NOTE — Telephone Encounter (Signed)
Connor Moore rescheduled his appointment from today to June 8th.  There is only a future order for POCT A1c in chart.  He is also due for routine labs like cholesterol.  Can you put order in for those test as well?

## 2018-06-03 NOTE — Telephone Encounter (Signed)
Can you make sure he has a lab appt before his appt?  Thanks!

## 2018-06-22 NOTE — Progress Notes (Signed)
Solon Alban T. Yamari Ventola, MD Primary Care and [place] at [facility] [place], [address] Phone: [phone]  FAX: [phone]  TYVION EDMONDSON - 68 y.o. male  MRN [id number]  Date of Birth: Apr 02, 1950  Visit Date: 06/23/2018  PCP: [name], MD  Referred by: [name], MD  Chief Complaint  Patient presents with  . Diabetes   Subjective:   Connor Moore is a 68 y.o. very pleasant male patient who presents with the following:  Gene is here for follow-up. Generally, he has nto had good control of his DM.  Diabetes Mellitus: Tolerating Medications: yes, taking  Compliance with diet: fair, Body mass index is 32.07 kg/m. Exercise: minimal / intermittent Avg blood sugars at home: not checking Foot problems: none Hypoglycemia: none No nausea, vomitting, blurred vision, polyuria.  Worsening diabetic neuropathy  Lab Results  Component Value Date   HGBA1C 11.6 (A) 06/23/2018   HGBA1C 8.5 (A) 10/14/2017   HGBA1C 8.8 (A) 07/08/2017   Lab Results  Component Value Date   MICROALBUR 2.3 (H) 08/27/2016   LDLCALC 37 08/27/2016   CREATININE 0.71 03/25/2017    Wt Readings from Last 3 Encounters:  06/23/18 231 lb 9 oz (105 kg)  10/14/17 230 lb (104.3 kg)  07/08/17 215 lb 12 oz (97.9 kg)    HTN: Tolerating all medications without side effects Stable and at goal No CP, no sob. No HA.  BP Readings from Last 3 Encounters:  06/23/18 130/72  10/14/17 130/70  09/11/17 962/22    Basic Metabolic Panel:    Component Value Date/Time   NA 139 03/25/2017 0813   K 4.7 03/25/2017 0813   CL 106 03/25/2017 0813   CO2 27 03/25/2017 0813   BUN 16 03/25/2017 0813   CREATININE 0.71 03/25/2017 0813   GLUCOSE 220 (H) 03/25/2017 0813   CALCIUM 10.0 03/25/2017 0813    Lipids: Doing well, stable. Tolerating meds fine with no SE. Panel reviewed with patient.  Lipids:    Component Value Date/Time   CHOL 127  03/25/2017 0813   TRIG 240.0 (H) 03/25/2017 0813   HDL 37.20 (L) 03/25/2017 0813   LDLDIRECT 51.0 03/25/2017 0813   VLDL 48.0 (H) 03/25/2017 0813   CHOLHDL 3 03/25/2017 0813    Lab Results  Component Value Date   ALT 12 03/25/2017   AST 10 03/25/2017   ALKPHOS 65 03/25/2017   BILITOT 0.6 03/25/2017    Not smoking  Past Medical History, Surgical History, Social History, Family History, Problem List, Medications, and Allergies have been reviewed and updated if relevant.  Patient Active Problem List   Diagnosis Date Noted  . Type 2 diabetes, uncontrolled, with neuropathy ([place]) 04/01/2008    Priority: High  . Diabetic neuropathy ([place]) 12/07/2010    Priority: Medium  . Hyperlipidemia 02/12/2008    Priority: Medium  . Erectile dysfunction associated with type 2 diabetes mellitus ([place]) 02/12/2008    Priority: Medium  . Essential hypertension 02/12/2008    Priority: Medium  . 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 Diverticulosis   . Colon polyps   . TOBACCO USE 05/19/2009    Past Medical History:  Diagnosis Date  . Colon polyps   . Diabetes mellitus type II   . Diabetic neuropathy ([place]) 12/07/2010  . Diverticulosis   . ED (erectile dysfunction)    rare  . HLD (hyperlipidemia)   . HTN (hypertension)   .  Obesity     Past Surgical History:  Procedure Laterality Date  . APPENDECTOMY    . INGUINAL HERNIA REPAIR  1974  . KNEE ARTHROSCOPY  1986   Right  . Left Hydrocele surgery  1974  . REPLACEMENT UNICONDYLAR JOINT KNEE  2009  . TESTICLE REMOVAL  1974   Left  . TONSILLECTOMY    . VASECTOMY  1981    Social History   Socioeconomic History  . Marital status: Married    Spouse name: Not on file  . Number of children: Not on file  . Years of education: Not on file  . Highest education level: Not on file  Occupational History  . Occupation: Clinical biochemist, [employer]  . Financial resource strain: Not on  file  . Food insecurity:    Worry: Not on file    Inability: Not on file  . Transportation needs:    Medical: Not on file    Non-medical: Not on file  Tobacco Use  . Smoking status: Former Smoker    Packs/day: 0.75    Years: 30.00    Pack years: 22.50    Types: Cigarettes    Start date: 08/18/2017  . Smokeless tobacco: Never Used  . Tobacco comment: ? quit 2011  Substance and Sexual Activity  . Alcohol use: Yes    Alcohol/week: 0.0 standard drinks    Comment: Socially  . Drug use: No  . Sexual activity: Not on file  Lifestyle  . Physical activity:    Days per week: Not on file    Minutes per session: Not on file  . Stress: Not on file  Relationships  . Social connections:    Talks on phone: Not on file    Gets together: Not on file    Attends religious service: Not on file    Active member of club or organization: Not on file    Attends meetings of clubs or organizations: Not on file    Relationship status: Not on file  . Intimate partner violence:    Fear of current or ex partner: Not on file    Emotionally abused: Not on file    Physically abused: Not on file    Forced sexual activity: Not on file  Other Topics Concern  . Not on file  Social History Narrative  . Not on file    Family History  Problem Relation Age of Onset  . Diabetes Father   . Cancer Father        mouth  . Mental illness Mother        ?   . Diabetes Sister   . Colon cancer Neg Hx   . Esophageal cancer Neg Hx   . Rectal cancer Neg Hx   . Stomach cancer Neg Hx     No Known Allergies  Medication list reviewed and updated in full in [place].   GEN: No acute illnesses, no fevers, chills. GI: No n/v/d, eating normally Pulm: No SOB Interactive and getting along well at home.  Otherwise, ROS is as per the HPI.  Objective:   BP 130/72   Pulse 79   Temp 98.5 F (36.9 C) (Oral)   Ht 5' 11.25" (1.81 m)   Wt 231 lb 9 oz (105 kg)   BMI 32.07 kg/m   GEN: WDWN, NAD,  Non-toxic, A & O x 3 HEENT: Atraumatic, Normocephalic. Neck supple. No masses, No LAD. Ears and Nose: No external deformity. CV:  RRR, No M/G/R. No JVD. No thrill. No extra heart sounds. PULM: CTA B, no wheezes, crackles, rhonchi. No retractions. No resp. distress. No accessory muscle use. EXTR: No c/c/e NEURO Normal gait.  PSYCH: Normally interactive. Conversant. Not depressed or anxious appearing.  Calm demeanor.   Laboratory and Imaging Data:  Assessment and Plan:   Type 2 diabetes, uncontrolled, with neuropathy ([place]) - Plan: POCT glycosylated hemoglobin (Hb A1C), Insulin Detemir (LEVEMIR) 100 UNIT/ML Pen, Insulin Pen Needle 32G X 5 MM MISC  Essential hypertension  Pure hypercholesterolemia  DM is doing poorly with an a1c of 12 despite 4 oral meds.   Convert to levemir with close f/u  Patient Instructions  Stop Glipizide, Januvia, pioglitazone  Keep taking metformin  Start using the insulin pen. Start with 10 units in a subcutaneous shot each morning  Check your blood sugars in the morning (fasting) and 2 hours after breakfast, lunch and dinner for now  If your blood sugar is over 200, increase your dose by 2 units the next morning.  If you get to 40 units, let me know, and I will have to adjust your dosing.    Follow-up: Return in about 3 months (around 09/23/2018) for complete physical and medicare wellness.  Meds ordered this encounter  Medications  . Insulin Detemir (LEVEMIR) 100 UNIT/ML Pen    Sig: Start 10 units subcutaneous each AM, increasing dose as directed by 2 units daily    Dispense:  15 mL    Refill:  3  . Insulin Pen Needle 32G X 5 MM MISC    Sig: Use one daily along with your insulin pen    Dispense:  90 each    Refill:  3   No orders of the defined types were placed in this encounter.   Signed,  [name]. Donaldson Richter, MD   Outpatient Encounter Medications as of 06/23/2018  Medication Sig  . aspirin 81 MG tablet Take 81 mg by mouth daily.    [name]  atorvastatin (LIPITOR) 20 MG tablet TAKE 1 TABLET BY MOUTH EVERY DAY  . glucose blood (GLUCOMETER ELITE TEST STRIPS) test strip 1 each by Other route 2 (two) times daily. Use as instructed   . Lancets 28G MISC by Does not apply route as directed.    [name] lisinopril (PRINIVIL,ZESTRIL) 20 MG tablet TAKE 1 TABLET BY MOUTH EVERY DAY  . metFORMIN (GLUCOPHAGE-XR) 500 MG 24 hr tablet TAKE 4 TABLETS BY MOUTH DAILY WITH BREAKFAST  . sildenafil (REVATIO) 20 MG tablet TAKE 2 TO 5 TABLETS BY MOUTH 30 MINUTES PRIOR TO INTERCOURSE  . [DISCONTINUED] glipiZIDE (GLUCOTROL XL) 10 MG 24 hr tablet TAKE 2 TABLETS BY MOUTH DAILY WITH BREAKFAST  . [DISCONTINUED] JANUVIA 100 MG tablet TAKE 1 TABLET BY MOUTH EVERY DAY  . [DISCONTINUED] pioglitazone (ACTOS) 45 MG tablet TAKE 1 TABLET BY MOUTH DAILY  . Insulin Detemir (LEVEMIR) 100 UNIT/ML Pen Start 10 units subcutaneous each AM, increasing dose as directed by 2 units daily  . Insulin Pen Needle 32G X 5 MM MISC Use one daily along with your insulin pen  . [DISCONTINUED] ketorolac (ACULAR) 0.5 % ophthalmic solution INSTILL 1 DROP INTO LEFT EYE FOUR TIMES A DAY STARTING 1 DAY BEFORE SURGERY   Facility-Administered Encounter Medications as of 06/23/2018  Medication  . 0.9 %  sodium chloride infusion

## 2018-06-23 ENCOUNTER — Encounter: Payer: Self-pay | Admitting: Family Medicine

## 2018-06-23 ENCOUNTER — Ambulatory Visit (INDEPENDENT_AMBULATORY_CARE_PROVIDER_SITE_OTHER): Payer: Medicare HMO | Admitting: Family Medicine

## 2018-06-23 ENCOUNTER — Other Ambulatory Visit: Payer: Self-pay

## 2018-06-23 VITALS — BP 130/72 | HR 79 | Temp 98.5°F | Ht 71.25 in | Wt 231.6 lb

## 2018-06-23 DIAGNOSIS — I10 Essential (primary) hypertension: Secondary | ICD-10-CM

## 2018-06-23 DIAGNOSIS — E7800 Pure hypercholesterolemia, unspecified: Secondary | ICD-10-CM | POA: Diagnosis not present

## 2018-06-23 DIAGNOSIS — E1140 Type 2 diabetes mellitus with diabetic neuropathy, unspecified: Secondary | ICD-10-CM | POA: Diagnosis not present

## 2018-06-23 DIAGNOSIS — E1165 Type 2 diabetes mellitus with hyperglycemia: Secondary | ICD-10-CM

## 2018-06-23 DIAGNOSIS — IMO0002 Reserved for concepts with insufficient information to code with codable children: Secondary | ICD-10-CM

## 2018-06-23 LAB — POCT GLYCOSYLATED HEMOGLOBIN (HGB A1C): Hemoglobin A1C: 11.6 % — AB (ref 4.0–5.6)

## 2018-06-23 MED ORDER — INSULIN DETEMIR 100 UNIT/ML FLEXPEN: PEN_INJECTOR | SUBCUTANEOUS | 3 refills | Status: DC

## 2018-06-23 MED ORDER — INSULIN PEN NEEDLE 32G X 5 MM MISC: 3 refills | Status: DC

## 2018-06-23 NOTE — Patient Instructions (Addendum)
Stop Glipizide, Januvia, pioglitazone  Keep taking metformin  Start using the insulin pen. Start with 10 units in a subcutaneous shot each morning  Check your blood sugars in the morning (fasting) and 2 hours after breakfast, lunch and dinner for now  If your blood sugar is over 200, increase your dose by 2 units the next morning.  If you get to 40 units, let me know, and I will have to adjust your dosing.

## 2018-07-07 DIAGNOSIS — E118 Type 2 diabetes mellitus with unspecified complications: Secondary | ICD-10-CM | POA: Diagnosis not present

## 2018-07-11 ENCOUNTER — Telehealth: Payer: Self-pay

## 2018-07-11 NOTE — Telephone Encounter (Signed)
Pt was seen on 06/23/18. Pt has been increasing levemir 2 units daily and today pt took 40 U of levemir and metformin XR 500 mg taking 4 tabs a breakfast.07/11/18 BS 374. Helene Kelp (DPR signed) said that pt is taking BS bid and all BS running in upper 300's.Pt is not experiencing any symptoms; the neuropathy in pts feet is better.pt is doing well following diabetic diet (pt has eaten some bread not on diet). Pt still has levemir but from 06/23/18 advised if reached levemir 40 units to call Dr Lorelei Pont for dosage change. Helene Kelp request cb. [facility].

## 2018-07-12 NOTE — Telephone Encounter (Signed)
OK, have him continue, but increase his Levemir by 2 units every other day.  Keep doing this until blood sugars normalize or he gets to 60 units, then I will have to help him split dosing into BID dosing

## 2018-07-14 NOTE — Telephone Encounter (Signed)
Helene Kelp notified as instructed by telephone.

## 2018-07-15 ENCOUNTER — Telehealth: Payer: Self-pay | Admitting: Family Medicine

## 2018-07-15 DIAGNOSIS — IMO0002 Reserved for concepts with insufficient information to code with codable children: Secondary | ICD-10-CM

## 2018-07-15 DIAGNOSIS — I10 Essential (primary) hypertension: Secondary | ICD-10-CM

## 2018-07-15 DIAGNOSIS — F17200 Nicotine dependence, unspecified, uncomplicated: Secondary | ICD-10-CM

## 2018-07-15 DIAGNOSIS — E7800 Pure hypercholesterolemia, unspecified: Secondary | ICD-10-CM

## 2018-07-15 DIAGNOSIS — E1140 Type 2 diabetes mellitus with diabetic neuropathy, unspecified: Secondary | ICD-10-CM

## 2018-07-15 NOTE — Telephone Encounter (Signed)
Patient's wife called and is asking for patient to be referred to a cardiologist.  Dr.Copland has discussed with patient his risk factors for a heart attack.  Patient's wife goes to [facility] and would like for him to be referred to that office. Patient can go anytime.

## 2018-07-15 NOTE — Telephone Encounter (Signed)
done

## 2018-07-31 ENCOUNTER — Other Ambulatory Visit: Payer: Self-pay

## 2018-07-31 DIAGNOSIS — E1140 Type 2 diabetes mellitus with diabetic neuropathy, unspecified: Secondary | ICD-10-CM

## 2018-07-31 DIAGNOSIS — IMO0002 Reserved for concepts with insufficient information to code with codable children: Secondary | ICD-10-CM

## 2018-07-31 NOTE — Telephone Encounter (Signed)
Mrs Howatt left v/m that pharmacy needs new rx with updated instructions on how pt should be taking levemir insulin. The original rx had take 10 units [place] q AM and increasing dosage as directed by 2 U. Pt is presently taking 55 U. pts FBS for last 2 days has been 140 so they are hoping the BS has leveled off. See 07-11-18 phone note. [facility]. Pt has enough levemir to last for 1 1/2 wks.

## 2018-08-01 MED ORDER — INSULIN DETEMIR 100 UNIT/ML FLEXPEN: PEN_INJECTOR | SUBCUTANEOUS | 1 refills | Status: DC

## 2018-08-01 MED ORDER — LEVEMIR FLEXTOUCH 100 UNIT/ML ~~LOC~~ SOPN: PEN_INJECTOR | SUBCUTANEOUS | 1 refills | Status: DC

## 2018-08-01 NOTE — Addendum Note (Signed)
Addended by: [name] on: 08/01/2018 08:30 AM   Modules accepted: Orders

## 2018-08-04 NOTE — Telephone Encounter (Signed)
Connor Moore (DPR signed) left v/m that having problem with insurance paying for 3 month supply. Connor Moore said 3 mth supply cost $451.00; pt is now in donut hole but can get 30 day supply for substantial savings, Connor Moore request 30 day supply sent to [facility]. I spoke with Threasa Beards at [facility] and pt has already picked up one box which is 30 day supply. Threasa Beards said can request 1 month supply each time and if ins goes back to 90 days then that will be available also. Connor Moore voiced understanding.

## 2018-08-05 DIAGNOSIS — Z961 Presence of intraocular lens: Secondary | ICD-10-CM | POA: Diagnosis not present

## 2018-08-05 DIAGNOSIS — E119 Type 2 diabetes mellitus without complications: Secondary | ICD-10-CM | POA: Diagnosis not present

## 2018-08-05 LAB — HM DIABETES EYE EXAM

## 2018-08-13 ENCOUNTER — Telehealth: Payer: Self-pay | Admitting: *Deleted

## 2018-08-13 NOTE — Telephone Encounter (Signed)
LVMTCB so we can change his appointment into a virtual visit.

## 2018-08-14 ENCOUNTER — Telehealth: Payer: Self-pay | Admitting: *Deleted

## 2018-08-14 NOTE — Telephone Encounter (Signed)
Spoke with patient and his wife and he does not want to have a virtual visit so he told me to cancel and set appointment up with another provider. I told him that someone would contact him about an appointment with another provider.

## 2018-08-21 ENCOUNTER — Ambulatory Visit: Payer: Medicare HMO | Admitting: Internal Medicine

## 2018-08-26 ENCOUNTER — Encounter: Payer: Self-pay | Admitting: Family Medicine

## 2018-08-30 ENCOUNTER — Other Ambulatory Visit: Payer: Self-pay | Admitting: Family Medicine

## 2018-09-23 NOTE — Progress Notes (Signed)
Connor Moore T. Connor Brian, MD [facility] [place], [address] Phone: [phone]  FAX: [phone]  Connor Moore - 68 y.o. male  MRN [id number]  Date of Birth: 12-31-1950  Visit Date: 09/24/2018  PCP: Connor Loffler, MD  Referred by: Connor Loffler, MD  Chief Complaint  Patient presents with  . Medicare Wellness   Patient Care Team: Connor Loffler, MD as PCP - General Subjective:   Connor Moore is a 67 y.o. pleasant patient who presents for a medicare wellness examination and complete physical exam:  Preventative Health Maintenance Visit:  Health Maintenance Summary Reviewed and updated, unless pt declines services.  Tobacco History Reviewed. Alcohol: No concerns, no excessive use Exercise Habits: Some activity, rec at least 30 mins 5 times a week STD concerns: no risk or activity to increase risk Drug Use: None Encouraged self-testicular check  Diabetes Mellitus: Tolerating Medications: yes Compliance with diet: fair, Body mass index is 30.65 kg/m. Exercise: minimal / intermittent Avg blood sugars at home: improving Foot problems: none Hypoglycemia: none No nausea, vomitting, blurred vision, polyuria.  Brings in his BS log  Lab Results  Component Value Date   HGBA1C 11.6 (A) 06/23/2018   HGBA1C 8.5 (A) 10/14/2017   HGBA1C 8.8 (A) 07/08/2017   Lab Results  Component Value Date   MICROALBUR 2.3 (H) 08/27/2016   LDLCALC 37 08/27/2016   CREATININE 0.71 03/25/2017    Wt Readings from Last 3 Encounters:  09/24/18 219 lb 12 oz (99.7 kg)  06/23/18 231 lb 9 oz (105 kg)  10/14/17 230 lb (104.3 kg)     Health Maintenance  Topic Date Due  . FOOT EXAM  07/05/2015  . PNA vac Low Risk Adult (1 of 2 - PCV13) 10/15/2018 (Originally 03/30/2017)  . INFLUENZA VACCINE  04/15/2019 (Originally 08/16/2018)  . HEMOGLOBIN A1C  12/23/2018  . COLONOSCOPY  06/19/2019  .  OPHTHALMOLOGY EXAM  08/05/2019  . TETANUS/TDAP  03/30/2025  . Hepatitis C Screening  Completed    Immunization History  Administered Date(s) Administered  . Tdap 03/31/2015    Patient Active Problem List   Diagnosis Date Noted  . Type 2 diabetes, uncontrolled, with neuropathy (Connor Moore) 04/01/2008    Priority: High  . Diabetic neuropathy ([place]) 12/07/2010    Priority: Medium  . Hyperlipidemia 02/12/2008    Priority: Medium  . Erectile dysfunction associated with type 2 diabetes mellitus ([place]) 02/12/2008    Priority: Medium  . Essential hypertension 02/12/2008    Priority: Medium  . 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 Diverticulosis   . Colon polyps   . TOBACCO USE 05/19/2009   Past Medical History:  Diagnosis Date  . Colon polyps   . Diabetes mellitus type II   . Diabetic neuropathy ([place]) 12/07/2010  . Diverticulosis   . ED (erectile dysfunction)    rare  . HLD (hyperlipidemia)   . HTN (hypertension)   . Obesity    Past Surgical History:  Procedure Laterality Date  . APPENDECTOMY    . INGUINAL HERNIA REPAIR  1974  . KNEE ARTHROSCOPY  1986   Right  . Left Hydrocele surgery  1974  . REPLACEMENT UNICONDYLAR JOINT KNEE  2009  . TESTICLE REMOVAL  1974   Left  . TONSILLECTOMY    . VASECTOMY  1981   Social History   Socioeconomic History  . Marital status: Married    Spouse  name: Not on file  . Number of children: Not on file  . Years of education: Not on file  . Highest education level: Not on file  Occupational History  . Occupation: Clinical biochemist, [employer]  . Financial resource strain: Not on file  . Food insecurity    Worry: Not on file    Inability: Not on file  . Transportation needs    Medical: Not on file    Non-medical: Not on file  Tobacco Use  . Smoking status: Former Smoker    Packs/day: 0.75    Years: 30.00    Pack years: 22.50    Types: Cigarettes    Start date: 08/18/2017  .  Smokeless tobacco: Never Used  . Tobacco comment: ? quit 2011  Substance and Sexual Activity  . Alcohol use: Yes    Alcohol/week: 0.0 standard drinks    Comment: Socially  . Drug use: No  . Sexual activity: Not on file  Lifestyle  . Physical activity    Days per week: Not on file    Minutes per session: Not on file  . Stress: Not on file  Relationships  . Social Herbalist on phone: Not on file    Gets together: Not on file    Attends religious service: Not on file    Active member of club or organization: Not on file    Attends meetings of clubs or organizations: Not on file    Relationship status: Not on file  . Intimate partner violence    Fear of current or ex partner: Not on file    Emotionally abused: Not on file    Physically abused: Not on file    Forced sexual activity: Not on file  Other Topics Concern  . Not on file  Social History Narrative  . Not on file   Family History  Problem Relation Age of Onset  . Diabetes Father   . Cancer Father        mouth  . Mental illness Mother        ?   . Diabetes Sister   . Colon cancer Neg Hx   . Esophageal cancer Neg Hx   . Rectal cancer Neg Hx   . Stomach cancer Neg Hx    No Known Allergies  Medication list has been reviewed and updated.   General: Denies fever, chills, sweats. No significant weight loss. Eyes: Denies blurring,significant itching ENT: Denies earache, sore throat, and hoarseness. Cardiovascular: Denies chest pains, palpitations, dyspnea on exertion Respiratory: Denies cough, dyspnea at rest,wheeezing Breast: no concerns about lumps GI: Denies nausea, vomiting, diarrhea, constipation, change in bowel habits, abdominal pain, melena, hematochezia GU: Ongoing ED Musculoskeletal: Denies back pain, joint pain Derm: Denies rash, itching Neuro: Denies  paresthesias, frequent falls, frequent headaches Psych: Denies depression, anxiety Endocrine: Denies cold intolerance, heat intolerance,  polydipsia Heme: Denies enlarged lymph nodes Allergy: No hayfever  Objective:   BP (!) 146/88   Pulse 71   Temp 98.7 F (37.1 C) (Temporal)   Ht 5\' 11"  (1.803 m)   Wt 219 lb 12 oz (99.7 kg)   SpO2 97%   BMI 30.65 kg/m  Fall Risk  09/24/2018 04/01/2017  Falls in the past year? 0 No   Ideal Body Weight: Weight in (lb) to have BMI = 25: 178.9  Hearing Screening   125Hz  250Hz  500Hz  1000Hz  2000Hz  3000Hz  4000Hz  6000Hz  8000Hz   Right ear:  Left ear:           Comments: Wears bilateral hearing aides.  Vision Screening Comments: Eye Exam with Dr. Katy Fitch 08/05/2018 Depression screen [facility] 2/9 09/24/2018 04/01/2017 09/03/2016  Decreased Interest 0 0 0  Down, Depressed, Hopeless 0 0 0  PHQ - 2 Score 0 0 0     GEN: well developed, well nourished, no acute distress Eyes: conjunctiva and lids normal, PERRLA, EOMI ENT: TM clear, nares clear, oral exam WNL Neck: supple, no lymphadenopathy, no thyromegaly, no JVD Pulm: clear to auscultation and percussion, respiratory effort normal CV: regular rate and rhythm, S1-S2, no murmur, rub or gallop, no bruits, peripheral pulses normal and symmetric, no cyanosis, clubbing, edema or varicosities GI: soft, non-tender; no hepatosplenomegaly, masses; active bowel sounds all quadrants GU: no hernia, testicular mass, penile discharge Lymph: no cervical, axillary or inguinal adenopathy MSK: gait normal, muscle tone and strength WNL, no joint swelling, effusions, discoloration, crepitus  SKIN: clear, good turgor, color WNL, no rashes, lesions, or ulcerations Neuro: normal mental status, normal strength, sensation, and motion Psych: alert; oriented to person, place and time, normally interactive and not anxious or depressed in appearance.  All labs reviewed with patient. Results for orders placed or performed in visit on 08/26/18  HM DIABETES EYE EXAM  Result Value Ref Range   HM Diabetic Eye Exam No Retinopathy No Retinopathy    Assessment and Plan:      ICD-10-CM   1. Healthcare maintenance  Z00.00    Ongoing use of insulin, change to BID If cont to does poorly, ENDO involvement  Health Maintenance Exam: The patient's preventative maintenance and recommended screening tests for an annual wellness exam were reviewed in full today. Brought up to date unless services declined.  Counselled on the importance of diet, exercise, and its role in overall health and mortality. The patient's FH and SH was reviewed, including their home life, tobacco status, and drug and alcohol status.  Follow-up in 1 year for physical exam or additional follow-up below.  I have personally reviewed the Medicare Annual Wellness questionnaire and have noted 1. The patient's medical and social history 2. Their use of alcohol, tobacco or illicit drugs 3. Their current medications and supplements 4. The patient's functional ability including ADL's, fall risks, home safety risks and hearing or visual             impairment. 5. Diet and physical activities 6. Evidence for depression or mood disorders 7. Reviewed Updated provider list, see scanned forms and [facility] Snapshot.  8. Reviewed whether or not the patient has HCPOA or living will, and discussed what this means with the patient.  Recommended he bring in a copy for his chart in [facility].  The patients weight, height, BMI and visual acuity have been recorded in the chart I have made referrals, counseling and provided education to the patient based review of the above and I have provided the pt with a written personalized care plan for preventive services.  I have provided the patient with a copy of your personalized plan for preventive services. Instructed to take the time to review along with their updated medication list.  Follow-up: No follow-ups on file. Or follow-up in 1 year if not noted.  Future Appointments  Date Time Provider [place]  09/24/2018 10:40 AM Buford Dresser, MD [facility]    No orders of the defined types were placed in this encounter.  Medications Discontinued During This Encounter  Medication Reason  .  Insulin Detemir (LEVEMIR FLEXTOUCH) [id number] UNIT/ML Pen Duplicate  . sildenafil (REVATIO) 20 MG tablet    No orders of the defined types were placed in this encounter.   Signed,  [name]. Jancie Kercher, MD   Allergies as of 09/24/2018   No Known Allergies     Medication List       Accurate as of September 24, 2018  9:01 AM. If you have any questions, ask your nurse or doctor.        STOP taking these medications   sildenafil 20 MG tablet Commonly known as: REVATIO Stopped by: Connor Loffler, MD     TAKE these medications   aspirin 81 MG tablet Take 81 mg by mouth daily.   atorvastatin 20 MG tablet Commonly known as: LIPITOR TAKE 1 TABLET BY MOUTH EVERY DAY   GLUCOMETER ELITE TEST STRIPS test strip Generic drug: glucose blood 1 each by Other route 2 (two) times daily. Use as instructed   Insulin Pen Needle 32G X 5 MM Misc Use one daily along with your insulin pen   Lancets 28G Misc by Does not apply route as directed.   Levemir FlexTouch 100 UNIT/ML Pen Generic drug: Insulin Detemir Inject 60 Units into the skin every morning.   lisinopril 20 MG tablet Commonly known as: ZESTRIL TAKE 1 TABLET BY MOUTH EVERY DAY   metFORMIN 500 MG 24 hr tablet Commonly known as: GLUCOPHAGE-XR TAKE 4 TABLETS BY MOUTH DAILY WITH BREAKFAST   VISION FORMULA PO Take 1 tablet by mouth daily.

## 2018-09-24 ENCOUNTER — Encounter: Payer: Self-pay | Admitting: Family Medicine

## 2018-09-24 ENCOUNTER — Ambulatory Visit (INDEPENDENT_AMBULATORY_CARE_PROVIDER_SITE_OTHER): Payer: Medicare HMO | Admitting: Family Medicine

## 2018-09-24 ENCOUNTER — Encounter: Payer: Self-pay | Admitting: Cardiology

## 2018-09-24 ENCOUNTER — Telehealth: Payer: Self-pay

## 2018-09-24 ENCOUNTER — Other Ambulatory Visit: Payer: Self-pay

## 2018-09-24 ENCOUNTER — Ambulatory Visit: Payer: Medicare HMO | Admitting: Cardiology

## 2018-09-24 VITALS — BP 142/80 | HR 70 | Ht 71.0 in | Wt 221.8 lb

## 2018-09-24 VITALS — BP 146/88 | HR 71 | Temp 98.7°F | Ht 71.0 in | Wt 219.8 lb

## 2018-09-24 DIAGNOSIS — Z125 Encounter for screening for malignant neoplasm of prostate: Secondary | ICD-10-CM | POA: Diagnosis not present

## 2018-09-24 DIAGNOSIS — Z79899 Other long term (current) drug therapy: Secondary | ICD-10-CM

## 2018-09-24 DIAGNOSIS — N138 Other obstructive and reflux uropathy: Secondary | ICD-10-CM

## 2018-09-24 DIAGNOSIS — Z7189 Other specified counseling: Secondary | ICD-10-CM

## 2018-09-24 DIAGNOSIS — I10 Essential (primary) hypertension: Secondary | ICD-10-CM

## 2018-09-24 DIAGNOSIS — E785 Hyperlipidemia, unspecified: Secondary | ICD-10-CM

## 2018-09-24 DIAGNOSIS — E782 Mixed hyperlipidemia: Secondary | ICD-10-CM | POA: Diagnosis not present

## 2018-09-24 DIAGNOSIS — E1165 Type 2 diabetes mellitus with hyperglycemia: Secondary | ICD-10-CM | POA: Diagnosis not present

## 2018-09-24 DIAGNOSIS — Z713 Dietary counseling and surveillance: Secondary | ICD-10-CM | POA: Diagnosis not present

## 2018-09-24 DIAGNOSIS — E1140 Type 2 diabetes mellitus with diabetic neuropathy, unspecified: Secondary | ICD-10-CM

## 2018-09-24 DIAGNOSIS — Z0000 Encounter for general adult medical examination without abnormal findings: Secondary | ICD-10-CM

## 2018-09-24 DIAGNOSIS — E119 Type 2 diabetes mellitus without complications: Secondary | ICD-10-CM

## 2018-09-24 DIAGNOSIS — N401 Enlarged prostate with lower urinary tract symptoms: Secondary | ICD-10-CM | POA: Diagnosis not present

## 2018-09-24 DIAGNOSIS — Z7182 Exercise counseling: Secondary | ICD-10-CM | POA: Diagnosis not present

## 2018-09-24 DIAGNOSIS — Z794 Long term (current) use of insulin: Secondary | ICD-10-CM

## 2018-09-24 DIAGNOSIS — IMO0002 Reserved for concepts with insufficient information to code with codable children: Secondary | ICD-10-CM

## 2018-09-24 LAB — BASIC METABOLIC PANEL
BUN: 18 mg/dL (ref 6–23)
CO2: 25 mEq/L (ref 19–32)
Calcium: 9.8 mg/dL (ref 8.4–10.5)
Chloride: 105 mEq/L (ref 96–112)
Creatinine, Ser: 0.74 mg/dL (ref 0.40–1.50)
GFR: 105.67 mL/min (ref >60.00)
Glucose, Bld: 232 mg/dL — ABNORMAL HIGH (ref 70–99)
Potassium: 5.3 mEq/L — ABNORMAL HIGH (ref 3.5–5.1)
Sodium: 137 mEq/L (ref 135–145)

## 2018-09-24 LAB — MICROALBUMIN / CREATININE URINE RATIO
Creatinine,U: 131.4 mg/dL
Microalb Creat Ratio: 0.7 mg/g (ref 0.0–30.0)
Microalb, Ur: 0.9 mg/dL (ref 0.0–1.9)

## 2018-09-24 LAB — LIPID PANEL
Cholesterol: 115 mg/dL (ref 0–200)
HDL: 38.1 mg/dL — ABNORMAL LOW (ref >39.00)
LDL Cholesterol: 57 mg/dL (ref 0–99)
NonHDL: 76.78
Total CHOL/HDL Ratio: 3
Triglycerides: 101 mg/dL (ref 0.0–149.0)
VLDL: 20.2 mg/dL (ref 0.0–40.0)

## 2018-09-24 LAB — CBC WITH DIFFERENTIAL/PLATELET
Basophils Absolute: 0 10*3/uL (ref 0.0–0.1)
Basophils Relative: 0.6 % (ref 0.0–3.0)
Eosinophils Absolute: 0.1 10*3/uL (ref 0.0–0.7)
Eosinophils Relative: 1.6 % (ref 0.0–5.0)
HCT: 47.4 % (ref 39.0–52.0)
Hemoglobin: 15.7 g/dL (ref 13.0–17.0)
Lymphocytes Relative: 29.9 % (ref 12.0–46.0)
Lymphs Abs: 1.9 10*3/uL (ref 0.7–4.0)
MCHC: 33.1 g/dL (ref 30.0–36.0)
MCV: 96.2 fl (ref 78.0–100.0)
Monocytes Absolute: 0.6 10*3/uL (ref 0.1–1.0)
Monocytes Relative: 9 % (ref 3.0–12.0)
Neutro Abs: 3.7 10*3/uL (ref 1.4–7.7)
Neutrophils Relative %: 58.9 % (ref 43.0–77.0)
Platelets: 196 10*3/uL (ref 150.0–400.0)
RBC: 4.93 Mil/uL (ref 4.22–5.81)
RDW: 13.4 % (ref 11.5–15.5)
WBC: 6.2 10*3/uL (ref 4.0–10.5)

## 2018-09-24 LAB — HEPATIC FUNCTION PANEL
ALT: 34 U/L (ref 0–53)
AST: 26 U/L (ref 0–37)
Albumin: 4.4 g/dL (ref 3.5–5.2)
Alkaline Phosphatase: 74 U/L (ref 39–117)
Bilirubin, Direct: 0.2 mg/dL (ref 0.0–0.3)
Total Bilirubin: 0.8 mg/dL (ref 0.2–1.2)
Total Protein: 6.8 g/dL (ref 6.0–8.3)

## 2018-09-24 LAB — HEMOGLOBIN A1C: Hgb A1c MFr Bld: 11.2 % — ABNORMAL HIGH (ref 4.6–6.5)

## 2018-09-24 MED ORDER — LEVEMIR FLEXTOUCH 100 UNIT/ML ~~LOC~~ SOPN: PEN_INJECTOR | SUBCUTANEOUS | 3 refills | Status: DC

## 2018-09-24 NOTE — Addendum Note (Signed)
Addended by: Cloyd Stagers on: 09/24/2018 09:22 AM   Modules accepted: Orders

## 2018-09-24 NOTE — Progress Notes (Signed)
Cardiology Office Note:    Date:  09/24/2018   ID:  SHY BRUSH, DOB 1950/07/06, MRN [id number]  PCP:  [name], MD  Cardiologist:  Buford Dresser, MD PhD  Referring MD: [name], MD   CC: evaluate cardiovascular risk, talk about prevention  History of Present Illness:    Connor Moore is a 68 y.o. male with a hx of type II diabetes with recent insulin start, hypertension, hyperlipidemia, prior tobacco use who is seen as a new consult at the request of Copland, Frederico Hamman, MD for the evaluation and management of cardiovascular risk.  Directed to see cardiology by his wife Clarene Critchley (who is one of my patients). He wants to make sure that he is as tuned up as possible from a heart perspective.   Cardiovascular risk factors: Prior clinical ASCVD: none  Comorbid conditions: hypertension, hyperlipidemia, diabetes--on insulin for 3 mos Metabolic syndrome/Obesity: BMI 30 Chronic inflammatory conditions: none Tobacco use history: smoked from age 22 to age 19, now quit.  Family history: grandfather died in [place], had stroke prior. Was a smoker. No other family history.  Prior cardiac testing and/or incidental findings on other testing (ie coronary calcium): had stress test in the past, no records in our system. Told it was normal. Was >20 years ago. No history of cath or echo Exercise level: walks 3 miles 3x/week. No issues. Plays golf 2-3 times/week Current diet: eats bananas in the AM to keep cramps away, tries to keep away from a lot of sugar. Tries to eat healthy, watches portions. Wife cooks mostly, when they do go out to eat tries to eat salads/healthy foods. Does eat some fatty meats like bacon and sausage.  Denies chest pain, shortness of breath at rest or with normal exertion. No PND, orthopnea, LE edema or unexpected weight gain. No syncope or palpitations.  HTN: doesn't check at home. Was 140s/80s at Dr. Arlyn Dunning office this AM. Spent time today  reviewing guidelines, how to check.  Past Medical History:  Diagnosis Date  . Colon polyps   . Diabetes mellitus type II   . Diabetic neuropathy ([place]) 12/07/2010  . Diverticulosis   . ED (erectile dysfunction)    rare  . HLD (hyperlipidemia)   . HTN (hypertension)   . Obesity     Past Surgical History:  Procedure Laterality Date  . APPENDECTOMY    . INGUINAL HERNIA REPAIR  1974  . KNEE ARTHROSCOPY  1986   Right  . Left Hydrocele surgery  1974  . REPLACEMENT UNICONDYLAR JOINT KNEE  2009  . TESTICLE REMOVAL  1974   Left  . TONSILLECTOMY    . VASECTOMY  1981    Current Medications: Current Outpatient Medications on File Prior to Visit  Medication Sig  . aspirin 81 MG tablet Take 81 mg by mouth daily.    [name] atorvastatin (LIPITOR) 20 MG tablet TAKE 1 TABLET BY MOUTH EVERY DAY  . glucose blood (GLUCOMETER ELITE TEST STRIPS) test strip 1 each by Other route 2 (two) times daily. Use as instructed   . Insulin Pen Needle 32G X 5 MM MISC Use one daily along with your insulin pen  . Lancets 28G MISC by Does not apply route as directed.    [name] lisinopril (PRINIVIL,ZESTRIL) 20 MG tablet TAKE 1 TABLET BY MOUTH EVERY DAY  . metFORMIN (GLUCOPHAGE-XR) 500 MG 24 hr tablet TAKE 4 TABLETS BY MOUTH DAILY WITH BREAKFAST  . Multiple Vitamins-Minerals (VISION FORMULA PO) Take 1 tablet by mouth daily.  Current Facility-Administered Medications on File Prior to Visit  Medication  . 0.9 %  sodium chloride infusion     Allergies:   Patient has no known allergies.   Social History   Socioeconomic History  . Marital status: Married    Spouse name: Not on file  . Number of children: Not on file  . Years of education: Not on file  . Highest education level: Not on file  Occupational History  . Occupation: Clinical biochemist, [employer]  . Financial resource strain: Not on file  . Food insecurity    Worry: Not on file    Inability: Not on file  . Transportation needs     Medical: Not on file    Non-medical: Not on file  Tobacco Use  . Smoking status: Former Smoker    Packs/day: 0.75    Years: 30.00    Pack years: 22.50    Types: Cigarettes    Start date: 08/18/2017  . Smokeless tobacco: Never Used  . Tobacco comment: ? quit 2011  Substance and Sexual Activity  . Alcohol use: Yes    Alcohol/week: 0.0 standard drinks    Comment: Socially  . Drug use: No  . Sexual activity: Not on file  Lifestyle  . Physical activity    Days per week: Not on file    Minutes per session: Not on file  . Stress: Not on file  Relationships  . Social Herbalist on phone: Not on file    Gets together: Not on file    Attends religious service: Not on file    Active member of club or organization: Not on file    Attends meetings of clubs or organizations: Not on file    Relationship status: Not on file  Other Topics Concern  . Not on file  Social History Narrative  . Not on file     Family History: The patient's family history includes Cancer in his father; Diabetes in his father and sister; Mental illness in his mother. There is no history of Colon cancer, Esophageal cancer, Rectal cancer, or Stomach cancer.  ROS:   Please see the history of present illness.  Additional pertinent ROS: Constitutional: Negative for chills, fever, night sweats, unintentional weight loss  HENT: Negative for ear pain, positive for chronic hearing loss.   Eyes: Negative for loss of vision and eye pain.  Respiratory: Negative for cough, sputum, wheezing.   Cardiovascular: See HPI. Gastrointestinal: Negative for abdominal pain, melena, and hematochezia.  Genitourinary: Negative for dysuria and hematuria.  Musculoskeletal: Negative for falls and myalgias.  Skin: Negative for itching and rash.  Neurological: Negative for focal weakness, focal sensory changes and loss of consciousness.  Endo/Heme/Allergies: Does not bruise/bleed easily.     EKGs/Labs/Other Studies  Reviewed:    The following studies were reviewed today: Prior notes from Dr. Lorelei Pont.  EKG:  EKG is personally reviewed.  The ekg ordered today demonstrates NSR  Recent Labs: No results found for requested labs within last 8760 hours.  Recent Lipid Panel    Component Value Date/Time   CHOL 127 03/25/2017 0813   TRIG 240.0 (H) 03/25/2017 0813   HDL 37.20 (L) 03/25/2017 0813   CHOLHDL 3 03/25/2017 0813   VLDL 48.0 (H) 03/25/2017 0813   LDLCALC 37 08/27/2016 0832   LDLDIRECT 51.0 03/25/2017 0813    Physical Exam:    VS:  BP (!) 142/80   Pulse 70   Ht 5'  11" (1.803 m)   Wt 221 lb 12.8 oz (100.6 kg)   SpO2 95%   BMI 30.93 kg/m     Wt Readings from Last 3 Encounters:  09/24/18 221 lb 12.8 oz (100.6 kg)  09/24/18 219 lb 12 oz (99.7 kg)  06/23/18 231 lb 9 oz (105 kg)    GEN: Well nourished, well developed in no acute distress HEENT: Normal, moist mucous membranes NECK: No JVD CARDIAC: regular rhythm, normal S1 and S2, no murmurs, rubs, gallops.  VASCULAR: Radial and DP pulses 2+ bilaterally. No carotid bruits RESPIRATORY:  Clear to auscultation without rales, wheezing or rhonchi  ABDOMEN: Soft, non-tender, non-distended MUSCULOSKELETAL:  Ambulates independently SKIN: Warm and dry, no edema NEUROLOGIC:  Alert and oriented x 3. No focal neuro deficits noted. PSYCHIATRIC:  Normal affect    ASSESSMENT:    1. Cardiac risk counseling   2. Essential hypertension   3. Mixed hyperlipidemia   4. Type 2 diabetes mellitus without complication, with long-term current use of insulin ([place])   5. Counseling on health promotion and disease prevention   6. Nutritional counseling   7. Exercise counseling    PLAN:    Cardiac risk counseling and prevention recommendations: -recommend heart healthy/Mediterranean diet, with whole grains, fruits, vegetable, fish, lean meats, nuts, and olive oil. Limit salt. -recommend moderate walking, 3-5 times/week for 30-50 minutes each session. Aim  for at least 150 minutes.week. Goal should be pace of 3 miles/hours, or walking 1.5 miles in 30 minutes -recommend avoidance of tobacco products. Avoid excess alcohol. -ASCVD risk score: The ASCVD Risk score Mikey Bussing DC Jr., et al., 2013) failed to calculate for the following reasons:   The valid total cholesterol range is 130 to 320 mg/dL   LDL 51, Tchol 127 under levels. When 70/130 used, able to get estimate of 36% -with diabetes and high risk, agree with use of aspirin as well. No active bleeding.  Hypertension: elevated, even on recheck -counseled on how to check blood pressure:  -sit comfortably in a chair, feet uncrossed and flat on floor, for 5-10 minutes  -arm ideally should rest at the level of the heart. However, arm should be relaxed and not tense (for example, do not hold the arm up unsupported)  -avoid exercise, caffeine, and tobacco for at least 30 minutes prior to BP reading  -don't take BP cuff reading over clothes (always place on skin directly)  -I prefer to know how well the medication is working, so I would like you to take your readings 1-2 hours after taking your blood pressure medication if possible -goal <130/80 -had labs drawn this AM -has follow up with PCP in 3 mos -instructed that if his home BP readings are consistently elevated, call me and we will increase lisinopril. -counseled on lifestyle as above  Mixed hyperlipidemia: last TG 240, LDL 51, HDL 37.  -continue atorvastatin 20 mg daily. Goal for high risk primary prevention less than 100, agree with tight control given his risk factors -had labs drawn this AM. If TG remains >150, he meets indications for vascepa (elevated TG plus diabetes and 2 risk factors). Discuss at follow up -counseled on diet, exercise as above.  Type II diabetes: on insulin. Working to improve control -no clear guidelines yet on SGLT2i or GLP1-RA for prevention of ASCVD, but will follow literature and see if indications change for these  medications in high risk patients.  Plan for follow up: 6 mos or sooner PRN  Medication Adjustments/Labs and Tests Ordered:  Current medicines are reviewed at length with the patient today.  Concerns regarding medicines are outlined above.  Orders Placed This Encounter  Procedures  . EKG 12-Lead   No orders of the defined types were placed in this encounter.   Patient Instructions  Medication Instructions:  Your Physician recommend you continue on your current medication as directed.    If you need a refill on your cardiac medications before your next appointment, please call your pharmacy.   Lab work: None  Testing/Procedures: None  Follow-Up: At [facility], you and your health needs are our priority.  As part of our continuing mission to provide you with exceptional heart care, we have created designated Provider Care Teams.  These Care Teams include your primary Cardiologist (physician) and Advanced Practice Providers (APPs -  Physician Assistants and Nurse Practitioners) who all work together to provide you with the care you need, when you need it. You will need a follow up appointment in 6 months.  Please call our office 2 months in advance to schedule this appointment.  You may see Dr. Harrell Gave or one of the following Advanced Practice Providers on your designated Care Team:   Rosaria Ferries, PA-C . Jory Sims, DNP, ANP       Signed, Buford Dresser, MD PhD 09/24/2018 12:50 PM    [place]

## 2018-09-24 NOTE — Addendum Note (Signed)
Addended by: Cloyd Stagers on: 09/24/2018 09:14 AM   Modules accepted: Orders

## 2018-09-24 NOTE — Patient Instructions (Signed)
Medication Instructions:  Your Physician recommend you continue on your current medication as directed.    If you need a refill on your cardiac medications before your next appointment, please call your pharmacy.   Lab work: None  Testing/Procedures: None  Follow-Up: At [facility], you and your health needs are our priority.  As part of our continuing mission to provide you with exceptional heart care, we have created designated Provider Care Teams.  These Care Teams include your primary Cardiologist (physician) and Advanced Practice Providers (APPs -  Physician Assistants and Nurse Practitioners) who all work together to provide you with the care you need, when you need it. You will need a follow up appointment in 6 months.  Please call our office 2 months in advance to schedule this appointment.  You may see Dr. Christopher or one of the following Advanced Practice Providers on your designated Care Team:   Rhonda Barrett, PA-C . Kathryn Lawrence, DNP, ANP     

## 2018-09-24 NOTE — Patient Instructions (Signed)
Split your insulin so you take your insulin, half in the morning and half at night.   For example, take 30 units in the morning and 30 units before bed.  (Approx 12 hours apart)

## 2018-09-24 NOTE — Telephone Encounter (Signed)
Connor Moore to get new rx for levemir flextouch pen due to changing dosage to [facility]. I spoke with Heidi at [facility] and she will cancel the levemir with 55 units instructions and Heidi said qty would remain the same for 60 units daily # 60 ml or 4 boxes of levemir would be a 90 day supply. Per office note instructions updated on med list to to take 30 units in morning and 30 units before bedtime; approximately 12 hrs apart. Connor Moore voiced understanding and appreciative. FYI to DR Copland.

## 2018-09-25 LAB — PSA, TOTAL AND FREE
PSA, % Free: 15 % (calc) — ABNORMAL LOW (ref >25)
PSA, Free: 0.2 ng/mL
PSA, Total: 1.3 ng/mL (ref <=4.0)

## 2018-10-25 ENCOUNTER — Other Ambulatory Visit: Payer: Self-pay | Admitting: Family Medicine

## 2018-12-19 DIAGNOSIS — E118 Type 2 diabetes mellitus with unspecified complications: Secondary | ICD-10-CM | POA: Diagnosis not present

## 2018-12-24 ENCOUNTER — Ambulatory Visit: Payer: Medicare HMO | Admitting: Family Medicine

## 2018-12-30 ENCOUNTER — Other Ambulatory Visit: Payer: Self-pay | Admitting: Family Medicine

## 2018-12-31 ENCOUNTER — Encounter: Payer: Self-pay | Admitting: Family Medicine

## 2018-12-31 ENCOUNTER — Ambulatory Visit (INDEPENDENT_AMBULATORY_CARE_PROVIDER_SITE_OTHER): Payer: Medicare HMO | Admitting: Family Medicine

## 2018-12-31 ENCOUNTER — Other Ambulatory Visit: Payer: Self-pay

## 2018-12-31 VITALS — BP 140/74 | HR 70 | Temp 97.4°F | Ht 71.0 in | Wt 213.2 lb

## 2018-12-31 DIAGNOSIS — E1140 Type 2 diabetes mellitus with diabetic neuropathy, unspecified: Secondary | ICD-10-CM

## 2018-12-31 DIAGNOSIS — I10 Essential (primary) hypertension: Secondary | ICD-10-CM

## 2018-12-31 DIAGNOSIS — IMO0002 Reserved for concepts with insufficient information to code with codable children: Secondary | ICD-10-CM

## 2018-12-31 DIAGNOSIS — E1165 Type 2 diabetes mellitus with hyperglycemia: Secondary | ICD-10-CM | POA: Diagnosis not present

## 2018-12-31 LAB — POCT GLYCOSYLATED HEMOGLOBIN (HGB A1C): Hemoglobin A1C: 10.5 % — AB (ref 4.0–5.6)

## 2018-12-31 NOTE — Patient Instructions (Signed)
Check your blood sugar, right now check in the morning before breakfast and another time 2 hours after eating.   Slowly increase your insulin, for example increase to 31 units in the morning and 31 units in the evening.   Slowly increase dosing by 1 in the morning and in the evening with a goal of your morning blood sugars to be less than 150.

## 2018-12-31 NOTE — Progress Notes (Signed)
Connor Moore T. Connor Glorioso, MD Primary Care and [place] at [facility] [place], [address] Phone: [phone]  FAX: [phone]  Connor Moore - 68 y.o. male  MRN [id number]  Date of Birth: 1950/08/14  Visit Date: 12/31/2018  PCP: [name], MD  Referred by: [name], MD  Chief Complaint  Patient presents with  . Diabetes    This visit occurred during the SARS-CoV-2 public health emergency.  Safety protocols were in place, including screening questions prior to the visit, additional usage of staff PPE, and extensive cleaning of exam room while observing appropriate contact time as indicated for disinfecting solutions.   Subjective:   Connor Moore is a 68 y.o. very pleasant male patient who presents with the following:  Diabetes Mellitus: Tolerating Medications: yes Compliance with diet: fair, Body mass index is 29.74 kg/m. Exercise: minimal / intermittent Avg blood sugars at home: not checking Foot problems: none Hypoglycemia: none No nausea, vomitting, blurred vision, polyuria.  Lab Results  Component Value Date   HGBA1C 10.5 (A) 12/31/2018   HGBA1C 11.2 (H) 09/24/2018   HGBA1C 11.6 (A) 06/23/2018   Lab Results  Component Value Date   MICROALBUR 0.9 09/24/2018   LDLCALC 57 09/24/2018   CREATININE 0.74 09/24/2018    Wt Readings from Last 3 Encounters:  12/31/18 213 lb 4 oz (96.7 kg)  09/24/18 221 lb 12.8 oz (100.6 kg)  09/24/18 219 lb 12 oz (99.7 kg)    Croissant, banana, bowl of cereal  Wants to f/u 3 months  HTN: Tolerating all medications without side effects Stable and at goal No CP, no sob. No HA.  BP Readings from Last 3 Encounters:  12/31/18 140/74  09/24/18 (!) 142/80  09/24/18 (!) [id number]    Basic Metabolic Panel:    Component Value Date/Time   NA 137 09/24/2018 0908   K 5.3 (H) 09/24/2018 0908   CL 105 09/24/2018 0908   CO2 25 09/24/2018 0908   BUN 18  09/24/2018 0908   CREATININE 0.74 09/24/2018 0908   GLUCOSE 232 (H) 09/24/2018 0908   CALCIUM 9.8 09/24/2018 0908     Past Medical History, Surgical History, Social History, Family History, Problem List, Medications, and Allergies have been reviewed and updated if relevant.  Patient Active Problem List   Diagnosis Date Noted  . Type 2 diabetes, uncontrolled, with neuropathy ([place]) 04/01/2008    Priority: High  . Diabetic neuropathy ([place]) 12/07/2010    Priority: Medium  . Hyperlipidemia 02/12/2008    Priority: Medium  . Erectile dysfunction associated with type 2 diabetes mellitus ([place]) 02/12/2008    Priority: Medium  . Essential hypertension 02/12/2008    Priority: Medium  . 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 Diverticulosis   . Colon polyps   . TOBACCO USE 05/19/2009    Past Medical History:  Diagnosis Date  . Colon polyps   . Diabetes mellitus type II   . Diabetic neuropathy ([place]) 12/07/2010  . Diverticulosis   . ED (erectile dysfunction)    rare  . HLD (hyperlipidemia)   . HTN (hypertension)   . Obesity     Past Surgical History:  Procedure Laterality Date  . APPENDECTOMY    . INGUINAL HERNIA REPAIR  1974  . KNEE ARTHROSCOPY  1986   Right  . Left Hydrocele surgery  1974  . REPLACEMENT UNICONDYLAR JOINT KNEE  2009  . [place]  Left  . TONSILLECTOMY    . VASECTOMY  1981    Social History   Socioeconomic History  . Marital status: Married    Spouse name: Not on file  . Number of children: Not on file  . Years of education: Not on file  . Highest education level: Not on file  Occupational History  . Occupation: Clinical biochemist, [employer]  Tobacco Use  . Smoking status: Former Smoker    Packs/day: 0.75    Years: 30.00    Pack years: 22.50    Types: Cigarettes    Start date: 08/18/2017  . Smokeless tobacco: Never Used  . Tobacco comment: ? quit 2011  Substance and Sexual Activity  . Alcohol  use: Yes    Alcohol/week: 0.0 standard drinks    Comment: Socially  . Drug use: No  . Sexual activity: Not on file  Other Topics Concern  . Not on file  Social History Narrative  . Not on file   Social Determinants of Health   Financial Resource Strain:   . Difficulty of Paying Living Expenses: Not on file  Food Insecurity:   . Worried About Charity fundraiser in the Last Year: Not on file  . Ran Out of Food in the Last Year: Not on file  Transportation Needs:   . Lack of Transportation (Medical): Not on file  . Lack of Transportation (Non-Medical): Not on file  Physical Activity:   . Days of Exercise per Week: Not on file  . Minutes of Exercise per Session: Not on file  Stress:   . Feeling of Stress : Not on file  Social Connections:   . Frequency of Communication with Friends and Family: Not on file  . Frequency of Social Gatherings with Friends and Family: Not on file  . Attends Religious Services: Not on file  . Active Member of Clubs or Organizations: Not on file  . Attends Archivist Meetings: Not on file  . Marital Status: Not on file  Intimate Partner Violence:   . Fear of Current or Ex-Partner: Not on file  . Emotionally Abused: Not on file  . Physically Abused: Not on file  . Sexually Abused: Not on file    Family History  Problem Relation Age of Onset  . Diabetes Father   . Cancer Father        mouth  . Mental illness Mother        ?   . Diabetes Sister   . Colon cancer Neg Hx   . Esophageal cancer Neg Hx   . Rectal cancer Neg Hx   . Stomach cancer Neg Hx     No Known Allergies  Medication list reviewed and updated in full in [place].   GEN: No acute illnesses, no fevers, chills. GI: No n/v/d, eating normally Pulm: No SOB Interactive and getting along well at home.  Otherwise, ROS is as per the HPI.  Objective:   BP 140/74   Pulse 70   Temp (!) 97.4 F (36.3 C) (Temporal)   Ht 5\' 11"  (1.803 m)   Wt 213 lb 4 oz  (96.7 kg)   SpO2 94%   BMI 29.74 kg/m   GEN: WDWN, NAD, Non-toxic, A & O x 3 HEENT: Atraumatic, Normocephalic. Neck supple. No masses, No LAD. Ears and Nose: No external deformity. CV: RRR, No M/G/R. No JVD. No thrill. No extra heart sounds. PULM: CTA B, no wheezes, crackles, rhonchi. No retractions. No  resp. distress. No accessory muscle use. EXTR: No c/c/e NEURO Normal gait.  PSYCH: Normally interactive. Conversant. Not depressed or anxious appearing.  Calm demeanor.   Laboratory and Imaging Data: Results for orders placed or performed in visit on 12/31/18  POCT glycosylated hemoglobin (Hb A1C)  Result Value Ref Range   Hemoglobin A1C 10.5 (A) 4.0 - 5.6 %   HbA1c POC (<> result, manual entry)     HbA1c, POC (prediabetic range)     HbA1c, POC (controlled diabetic range)       Assessment and Plan:     ICD-10-CM   1. Essential hypertension  I10   2. Type 2 diabetes, uncontrolled, with neuropathy (HCC)  E11.40 POCT glycosylated hemoglobin (Hb A1C)   E11.65    Level of Medical Decision-Making in this case is Moderate.  His blood sugar continues to be quite high.  A1c is 10-1/2.  We will have him increase his insulin slowly.  I asked him to go see endocrinology, but at this point he does not want to do that.  Blood pressure stable.  Patient Instructions  Check your blood sugar, right now check in the morning before breakfast and another time 2 hours after eating.   Slowly increase your insulin, for example increase to 31 units in the morning and 31 units in the evening.   Slowly increase dosing by 1 in the morning and in the evening with a goal of your morning blood sugars to be less than 150.    Follow-up: Return in about 3 months (around 03/31/2019) for a1c testing at time of appointment.  No orders of the defined types were placed in this encounter.  Orders Placed This Encounter  Procedures  . POCT glycosylated hemoglobin (Hb A1C)    Signed,  Hikari Tripp T. Kaan Tosh,  MD   Outpatient Encounter Medications as of 12/31/2018  Medication Sig  . aspirin 81 MG tablet Take 81 mg by mouth daily.    [name] atorvastatin (LIPITOR) 20 MG tablet TAKE 1 TABLET BY MOUTH EVERY DAY  . glucose blood (GLUCOMETER ELITE TEST STRIPS) test strip 1 each by Other route 2 (two) times daily. Use as instructed   . Insulin Detemir (LEVEMIR FLEXTOUCH) 100 UNIT/ML Pen Inject 30 units subcutaneously in the morning and 30 units subcutaneously before bedtime; approximately 12 hours apart  . Insulin Pen Needle 32G X 5 MM MISC Use one daily along with your insulin pen  . Lancets 28G MISC by Does not apply route as directed.    [name] lisinopril (ZESTRIL) 20 MG tablet TAKE 1 TABLET BY MOUTH EVERY DAY  . metFORMIN (GLUCOPHAGE-XR) 500 MG 24 hr tablet TAKE 4 TABLETS BY MOUTH DAILY WITH BREAKFAST  . Multiple Vitamins-Minerals (VISION FORMULA PO) Take 1 tablet by mouth daily.   Facility-Administered Encounter Medications as of 12/31/2018  Medication  . 0.9 %  sodium chloride infusion

## 2019-02-13 DIAGNOSIS — M542 Cervicalgia: Secondary | ICD-10-CM | POA: Diagnosis not present

## 2019-02-13 DIAGNOSIS — M5412 Radiculopathy, cervical region: Secondary | ICD-10-CM | POA: Diagnosis not present

## 2019-02-13 DIAGNOSIS — E109 Type 1 diabetes mellitus without complications: Secondary | ICD-10-CM | POA: Diagnosis not present

## 2019-02-13 DIAGNOSIS — M519 Unspecified thoracic, thoracolumbar and lumbosacral intervertebral disc disorder: Secondary | ICD-10-CM | POA: Diagnosis not present

## 2019-02-17 ENCOUNTER — Telehealth: Payer: Self-pay | Admitting: *Deleted

## 2019-02-17 NOTE — Telephone Encounter (Signed)
Patient's wife left a voicemail stating he is scheduled to take the covid vaccine Saturday. Mrs. Ilgenfritz stated that her husband takes an aspirin daily. Mrs. Laverta Baltimore stated that they were told to call his doctor to make sure that it is okay for him to take the vaccine along with his aspirin?

## 2019-02-17 NOTE — Telephone Encounter (Signed)
Spoke with wife (on [place]) and informed her of Dr. Lillie Fragmin message. She understood and had no additional questions at this time.

## 2019-02-17 NOTE — Telephone Encounter (Signed)
This ok? 

## 2019-02-18 ENCOUNTER — Other Ambulatory Visit: Payer: Self-pay | Admitting: *Deleted

## 2019-02-18 MED ORDER — LISINOPRIL 20 MG PO TABS: 20.0000 mg | ORAL_TABLET | Freq: Every day | ORAL | 1 refills | Status: DC

## 2019-02-18 MED ORDER — METFORMIN HCL ER 500 MG PO TB24: ORAL_TABLET | ORAL | 1 refills | Status: DC

## 2019-02-18 MED ORDER — ATORVASTATIN CALCIUM 20 MG PO TABS: 20.0000 mg | ORAL_TABLET | Freq: Every day | ORAL | 1 refills | Status: DC

## 2019-02-18 MED ORDER — LEVEMIR FLEXTOUCH 100 UNIT/ML ~~LOC~~ SOPN: PEN_INJECTOR | SUBCUTANEOUS | 3 refills | Status: DC

## 2019-04-06 ENCOUNTER — Encounter: Payer: Self-pay | Admitting: Family Medicine

## 2019-04-06 ENCOUNTER — Other Ambulatory Visit: Payer: Self-pay

## 2019-04-06 ENCOUNTER — Ambulatory Visit (INDEPENDENT_AMBULATORY_CARE_PROVIDER_SITE_OTHER): Payer: Medicare Other | Admitting: Family Medicine

## 2019-04-06 VITALS — BP 126/84 | HR 73 | Temp 98.5°F | Ht 71.0 in | Wt 212.8 lb

## 2019-04-06 DIAGNOSIS — I10 Essential (primary) hypertension: Secondary | ICD-10-CM | POA: Diagnosis not present

## 2019-04-06 DIAGNOSIS — E1165 Type 2 diabetes mellitus with hyperglycemia: Secondary | ICD-10-CM

## 2019-04-06 DIAGNOSIS — IMO0002 Reserved for concepts with insufficient information to code with codable children: Secondary | ICD-10-CM

## 2019-04-06 DIAGNOSIS — E782 Mixed hyperlipidemia: Secondary | ICD-10-CM

## 2019-04-06 DIAGNOSIS — E1140 Type 2 diabetes mellitus with diabetic neuropathy, unspecified: Secondary | ICD-10-CM | POA: Diagnosis not present

## 2019-04-06 LAB — POCT GLYCOSYLATED HEMOGLOBIN (HGB A1C): Hemoglobin A1C: 9.2 % — AB (ref 4.0–5.6)

## 2019-04-06 NOTE — Progress Notes (Signed)
Connor Bail T. Garnell Begeman, MD Primary Care and [place] at [facility] [place], [address] Phone: [phone]  FAX: [phone]  Connor Moore - 69 y.o. male  MRN [id number]  Date of Birth: Jun 10, 1950  Visit Date: 04/06/2019  PCP: [name], MD  Referred by: [name], MD  Chief Complaint  Patient presents with  . Diabetes    This visit occurred during the SARS-CoV-2 public health emergency.  Safety protocols were in place, including screening questions prior to the visit, additional usage of staff PPE, and extensive cleaning of exam room while observing appropriate contact time as indicated for disinfecting solutions.   Subjective:   Connor Moore is a 69 y.o. very pleasant male patient who presents with the following:  Diabetes Mellitus: Tolerating Medications: yes Compliance with diet: fair, Body mass index is 29.67 kg/m. Exercise: minimal / intermittent Avg blood sugars at home: not checking Foot problems: none Hypoglycemia: none No nausea, vomitting, blurred vision, polyuria.  110 - 150 fasting Lantus 30 units q am and pm Trying to not eat after 7.  Lab Results  Component Value Date   HGBA1C 9.2 (A) 04/06/2019   HGBA1C 10.5 (A) 12/31/2018   HGBA1C 11.2 (H) 09/24/2018   Lab Results  Component Value Date   MICROALBUR 0.9 09/24/2018   LDLCALC 57 09/24/2018   CREATININE 0.74 09/24/2018    Wt Readings from Last 3 Encounters:  04/06/19 212 lb 12 oz (96.5 kg)  12/31/18 213 lb 4 oz (96.7 kg)  09/24/18 221 lb 12.8 oz (100.6 kg)     HTN: Tolerating all medications without side effects Stable and at goal No CP, no sob. No HA.  BP Readings from Last 3 Encounters:  04/06/19 126/84  12/31/18 140/74  09/24/18 (!) [id number]    Basic Metabolic Panel:    Component Value Date/Time   NA 137 09/24/2018 0908   K 5.3 (H) 09/24/2018 0908   CL 105 09/24/2018 0908   CO2 25 09/24/2018 0908   BUN 18 09/24/2018 0908   CREATININE 0.74 09/24/2018 0908   GLUCOSE 232 (H) 09/24/2018 0908   CALCIUM 9.8 09/24/2018 0908     Review of Systems is noted in the HPI, as appropriate  Objective:   BP 126/84   Pulse 73   Temp 98.5 F (36.9 C) (Temporal)   Ht 5\' 11"  (1.803 m)   Wt 212 lb 12 oz (96.5 kg)   SpO2 98%   BMI 29.67 kg/m   GEN: No acute distress; alert,appropriate. PULM: Breathing comfortably in no respiratory distress PSYCH: Normally interactive.   Laboratory and Imaging Data: Results for orders placed or performed in visit on 04/06/19  POCT glycosylated hemoglobin (Hb A1C)  Result Value Ref Range   Hemoglobin A1C 9.2 (A) 4.0 - 5.6 %   HbA1c POC (<> result, manual entry)     HbA1c, POC (prediabetic range)     HbA1c, POC (controlled diabetic range)       Assessment and Plan:     ICD-10-CM   1. Essential hypertension  I10   2. Type 2 diabetes, uncontrolled, with neuropathy (HCC)  E11.40 POCT glycosylated hemoglobin (Hb A1C)   E11.65   3. Mixed hyperlipidemia  E78.2    htn stable  Patient Instructions  Now at Levemir 30 units twice a day  Very slowly, increase by 1 unit in the AM and 1 unit in the PM.  Wait 1 week, and check your blood  sugar in the morning fasting and once a day 2 hours after a meal.   If fasting at 90 - 130 in the morning, do not change anything.  After this increase by one twice a day.  Give it at least 2 weeks before you increase it by 1 unit twice a day.    Follow-up: 6 mo cpx  No orders of the defined types were placed in this encounter.  There are no discontinued medications. Orders Placed This Encounter  Procedures  . POCT glycosylated hemoglobin (Hb A1C)    Signed,  Amier Hoyt T. Niaya Hickok, MD   Outpatient Encounter Medications as of 04/06/2019  Medication Sig  . aspirin 81 MG tablet Take 81 mg by mouth daily.    [name] atorvastatin (LIPITOR) 20 MG tablet Take 1 tablet (20 mg total) by mouth daily.  [name] glucose blood (GLUCOMETER  ELITE TEST STRIPS) test strip 1 each by Other route 2 (two) times daily. Use as instructed   . Insulin Detemir (LEVEMIR FLEXTOUCH) 100 UNIT/ML Pen Inject 30 units subcutaneously in the morning and 30 units subcutaneously before bedtime; approximately 12 hours apart  . Insulin Pen Needle 32G X 5 MM MISC Use one daily along with your insulin pen  . Lancets 28G MISC by Does not apply route as directed.    [name] lisinopril (ZESTRIL) 20 MG tablet Take 1 tablet (20 mg total) by mouth daily.  . metFORMIN (GLUCOPHAGE-XR) 500 MG 24 hr tablet TAKE 4 TABLETS BY MOUTH DAILY WITH BREAKFAST  . Multiple Vitamins-Minerals (VISION FORMULA PO) Take 1 tablet by mouth daily.   Facility-Administered Encounter Medications as of 04/06/2019  Medication  . 0.9 %  sodium chloride infusion

## 2019-04-06 NOTE — Patient Instructions (Addendum)
Now at Levemir 30 units twice a day  Very slowly, increase by 1 unit in the AM and 1 unit in the PM.  Wait 1 week, and check your blood sugar in the morning fasting and once a day 2 hours after a meal.   If fasting at 90 - 130 in the morning, do not change anything.  After this increase by one twice a day.  Give it at least 2 weeks before you increase it by 1 unit twice a day.

## 2019-04-23 ENCOUNTER — Telehealth: Payer: Self-pay

## 2019-04-23 ENCOUNTER — Other Ambulatory Visit: Payer: Self-pay | Admitting: Family Medicine

## 2019-04-23 NOTE — Telephone Encounter (Signed)
Pt's wife called the triage line asking what to do. Says he has been having nose bleeds for the last 2 weeks and they are getting worse. He is not using an allergy nasal spray and said his BP is well controlled. I advised her he needed to go to an ER for possible cauterization. She said he may not want to go to an ER. Since they live in [place], I mentioned that she could try [facility] or Dr Mickie Hillier office to see if they would get him in today. If not, he really needed to go to the ER.

## 2019-04-23 NOTE — Telephone Encounter (Signed)
I agree with that 

## 2019-04-24 ENCOUNTER — Ambulatory Visit (INDEPENDENT_AMBULATORY_CARE_PROVIDER_SITE_OTHER): Payer: Medicare Other | Admitting: Otolaryngology

## 2019-04-24 ENCOUNTER — Other Ambulatory Visit: Payer: Self-pay

## 2019-04-24 DIAGNOSIS — R040 Epistaxis: Secondary | ICD-10-CM | POA: Diagnosis not present

## 2019-04-24 NOTE — Progress Notes (Signed)
HPI: Connor Moore is a 69 y.o. male who presents for evaluation of recurrent right-sided epistaxis that has had for about 2 weeks.  He always bleeds from the right side sometimes when he sneezes and sometimes when he just blows his nose.  He is on no blood thinners except for baby aspirin.  Past Medical History:  Diagnosis Date  . Colon polyps   . Diabetes mellitus type II   . Diabetic neuropathy ([place]) 12/07/2010  . Diverticulosis   . ED (erectile dysfunction)    rare  . HLD (hyperlipidemia)   . HTN (hypertension)   . Obesity    Past Surgical History:  Procedure Laterality Date  . APPENDECTOMY    . INGUINAL HERNIA REPAIR  1974  . KNEE ARTHROSCOPY  1986   Right  . Left Hydrocele surgery  1974  . REPLACEMENT UNICONDYLAR JOINT KNEE  2009  . TESTICLE REMOVAL  1974   Left  . TONSILLECTOMY    . VASECTOMY  1981   Social History   Socioeconomic History  . Marital status: Married    Spouse name: Not on file  . Number of children: Not on file  . Years of education: Not on file  . Highest education level: Not on file  Occupational History  . Occupation: Clinical biochemist, [employer]  Tobacco Use  . Smoking status: Former Smoker    Packs/day: 0.75    Years: 30.00    Pack years: 22.50    Types: Cigarettes    Start date: 08/18/2017  . Smokeless tobacco: Never Used  . Tobacco comment: ? quit 2011  Substance and Sexual Activity  . Alcohol use: Yes    Alcohol/week: 0.0 standard drinks    Comment: Socially  . Drug use: No  . Sexual activity: Not on file  Other Topics Concern  . Not on file  Social History Narrative  . Not on file   Social Determinants of Health   Financial Resource Strain:   . Difficulty of Paying Living Expenses:   Food Insecurity:   . Worried About Charity fundraiser in the Last Year:   . Arboriculturist in the Last Year:   Transportation Needs:   . Film/video editor (Medical):   [name] Lack of Transportation (Non-Medical):   Physical  Activity:   . Days of Exercise per Week:   . Minutes of Exercise per Session:   Stress:   . Feeling of Stress :   Social Connections:   . Frequency of Communication with Friends and Family:   . Frequency of Social Gatherings with Friends and Family:   . Attends Religious Services:   . Active Member of Clubs or Organizations:   . Attends Archivist Meetings:   [name] Marital Status:    Family History  Problem Relation Age of Onset  . Diabetes Father   . Cancer Father        mouth  . Mental illness Mother        ?   . Diabetes Sister   . Colon cancer Neg Hx   . Esophageal cancer Neg Hx   . Rectal cancer Neg Hx   . Stomach cancer Neg Hx    No Known Allergies Prior to Admission medications   Medication Sig Start Date End Date Taking? Authorizing Provider  aspirin 81 MG tablet Take 81 mg by mouth daily.      [provider]  atorvastatin (LIPITOR) 20 MG tablet Take 1 tablet (20  mg total) by mouth daily. 02/18/19   Copland, Frederico Hamman, MD  glucose blood (GLUCOMETER ELITE TEST STRIPS) test strip 1 each by Other route 2 (two) times daily. Use as instructed     [provider]  Insulin Detemir (LEVEMIR FLEXTOUCH) 100 UNIT/ML Pen Inject 30 units subcutaneously in the morning and 30 units subcutaneously before bedtime; approximately 12 hours apart 02/18/19   Copland, Frederico Hamman, MD  Insulin Pen Needle 32G X 5 MM MISC Use one daily along with your insulin pen 06/23/18   Copland, Frederico Hamman, MD  Lancets 28G MISC by Does not apply route as directed.      [provider]  lisinopril (ZESTRIL) 20 MG tablet Take 1 tablet (20 mg total) by mouth daily. 02/18/19   Copland, Frederico Hamman, MD  metFORMIN (GLUCOPHAGE-XR) 500 MG 24 hr tablet TAKE 4 TABLETS BY MOUTH DAILY WITH BREAKFAST 02/18/19   Copland, Frederico Hamman, MD  Multiple Vitamins-Minerals (VISION FORMULA PO) Take 1 tablet by mouth daily.    [provider]     Positive ROS: Otherwise negative  All other systems have been  reviewed and were otherwise negative with the exception of those mentioned in the HPI and as above.  Physical Exam: Constitutional: Alert, well-appearing, no acute distress Ears: External ears without lesions or tenderness. Ear canals are clear bilaterally with intact, clear TMs.  Nasal: External nose without lesions. Septum midline..  Left nostril was clear.  On examination the right nostril he had to prominent vessels inferiorly.  After gently rubbing 1 with a Q-tip it bled rather profusely and this was cauterized using silver nitrate.  Remaining nasal cavity was clear with no other masses or polyps noted. Oral: Lips and gums without lesions. Tongue and palate mucosa without lesions. Posterior oropharynx clear. Neck: No palpable adenopathy or masses Respiratory: Breathing comfortably  Skin: No facial/neck lesions or rash noted.  Control of epistaxis  Date/Time: 04/24/2019 6:22 PM Performed by: Rozetta Nunnery, MD Authorized by: Rozetta Nunnery, MD   Consent:    Consent obtained:  Verbal   Consent given by:  Patient   Risks discussed:  Bleeding and pain   Alternatives discussed:  No treatment and observation Procedure details:    Treatment site:  R anterior   Treatment method:  Silver nitrate   Treatment complexity:  Limited   Treatment episode: initial   Post-procedure details:    Assessment:  Bleeding stopped   Patient tolerance of procedure:  Tolerated well, no immediate complications Comments:     Right anterior inferior septal vessel was cauterized using silver nitrate.    Assessment: Right-sided epistaxis from anterior inferior septal vessel  Plan: This was cauterized using silver nitrate. He will follow-up as needed any further bleeding.  Radene Journey, MD

## 2019-04-30 ENCOUNTER — Other Ambulatory Visit: Payer: Self-pay | Admitting: Family Medicine

## 2019-05-11 ENCOUNTER — Ambulatory Visit: Payer: Medicare HMO | Admitting: Cardiology

## 2019-05-14 ENCOUNTER — Other Ambulatory Visit: Payer: Self-pay | Admitting: Family Medicine

## 2019-05-15 ENCOUNTER — Telehealth: Payer: Self-pay | Admitting: Family Medicine

## 2019-05-15 NOTE — Progress Notes (Signed)
°  Chronic Care Management   Note  05/15/2019 Name: Connor Moore MRN: [id number] DOB: [birth year]  Connor Moore is a 69 y.o. year old male who is a primary care patient of Copland, Frederico Hamman, MD. I reached out to [name] by phone today in response to a referral sent by Connor Moore's PCP, [name], MD.   Connor Moore was given information about Chronic Care Management services today including:  1. CCM service includes personalized support from designated clinical staff supervised by his physician, including individualized plan of care and coordination with other care providers 2. 24/7 contact phone numbers for assistance for urgent and routine care needs. 3. Service will only be billed when office clinical staff spend 20 minutes or more in a month to coordinate care. 4. Only one practitioner may furnish and bill the service in a calendar month. 5. The patient may stop CCM services at any time (effective at the end of the month) by phone call to the office staff.   Patient agreed to services and verbal consent obtained.    This note is not being shared with the patient for the following reason: To respect privacy (The patient or proxy has requested that the information not be shared).  Follow up plan:   Raynicia Dukes [employer] Scheduler

## 2019-06-17 ENCOUNTER — Ambulatory Visit: Payer: Medicare Other | Admitting: Cardiology

## 2019-06-18 ENCOUNTER — Telehealth: Payer: Self-pay

## 2019-06-18 DIAGNOSIS — E1140 Type 2 diabetes mellitus with diabetic neuropathy, unspecified: Secondary | ICD-10-CM

## 2019-06-18 DIAGNOSIS — I10 Essential (primary) hypertension: Secondary | ICD-10-CM

## 2019-06-18 DIAGNOSIS — IMO0002 Reserved for concepts with insufficient information to code with codable children: Secondary | ICD-10-CM

## 2019-06-18 NOTE — Telephone Encounter (Signed)
Per written referral from PCP, requesting referral in Epic for Connor Moore to chronic care management pharmacy services for the following conditions:   Essential hypertension, benign  [I10]  Type 2 diabetes, uncontrolled, with neuropathy [E11.40, E11.65]  [name], PharmD Clinical Pharmacist [facility] at [facility] [phone]

## 2019-06-18 NOTE — Telephone Encounter (Signed)
Please sign referral

## 2019-06-19 NOTE — Telephone Encounter (Signed)
I have collaborated with the care management provider regarding care management and care coordination activities outlined in this encounter and have reviewed this encounter including documentation in the note and care plan. I am certifying that I agree with the content of this note and encounter as supervising physician.  

## 2019-06-24 ENCOUNTER — Telehealth: Payer: Medicare Other

## 2019-07-08 ENCOUNTER — Other Ambulatory Visit: Payer: Self-pay | Admitting: Family Medicine

## 2019-07-22 ENCOUNTER — Other Ambulatory Visit: Payer: Self-pay | Admitting: Family Medicine

## 2019-07-27 ENCOUNTER — Encounter: Payer: Self-pay | Admitting: General Practice

## 2019-07-27 ENCOUNTER — Other Ambulatory Visit: Payer: Self-pay | Admitting: Family Medicine

## 2019-07-30 DIAGNOSIS — D225 Melanocytic nevi of trunk: Secondary | ICD-10-CM | POA: Diagnosis not present

## 2019-07-30 DIAGNOSIS — Z1283 Encounter for screening for malignant neoplasm of skin: Secondary | ICD-10-CM | POA: Diagnosis not present

## 2019-08-06 DIAGNOSIS — E119 Type 2 diabetes mellitus without complications: Secondary | ICD-10-CM | POA: Diagnosis not present

## 2019-08-06 DIAGNOSIS — Z961 Presence of intraocular lens: Secondary | ICD-10-CM | POA: Diagnosis not present

## 2019-08-06 DIAGNOSIS — H35033 Hypertensive retinopathy, bilateral: Secondary | ICD-10-CM | POA: Diagnosis not present

## 2019-08-06 LAB — HM DIABETES EYE EXAM

## 2019-08-10 ENCOUNTER — Encounter: Payer: Self-pay | Admitting: Family Medicine

## 2019-09-28 ENCOUNTER — Encounter: Payer: Self-pay | Admitting: Family Medicine

## 2019-09-28 ENCOUNTER — Ambulatory Visit (INDEPENDENT_AMBULATORY_CARE_PROVIDER_SITE_OTHER): Payer: Medicare Other | Admitting: Family Medicine

## 2019-09-28 ENCOUNTER — Other Ambulatory Visit: Payer: Self-pay

## 2019-09-28 VITALS — BP 158/80 | HR 62 | Temp 98.2°F | Ht 71.0 in | Wt 212.8 lb

## 2019-09-28 DIAGNOSIS — Z79899 Other long term (current) drug therapy: Secondary | ICD-10-CM | POA: Diagnosis not present

## 2019-09-28 DIAGNOSIS — E782 Mixed hyperlipidemia: Secondary | ICD-10-CM | POA: Diagnosis not present

## 2019-09-28 DIAGNOSIS — I10 Essential (primary) hypertension: Secondary | ICD-10-CM

## 2019-09-28 DIAGNOSIS — Z0001 Encounter for general adult medical examination with abnormal findings: Secondary | ICD-10-CM | POA: Diagnosis not present

## 2019-09-28 DIAGNOSIS — Z1211 Encounter for screening for malignant neoplasm of colon: Secondary | ICD-10-CM | POA: Diagnosis not present

## 2019-09-28 DIAGNOSIS — Z86010 Personal history of colonic polyps: Secondary | ICD-10-CM | POA: Diagnosis not present

## 2019-09-28 DIAGNOSIS — E1165 Type 2 diabetes mellitus with hyperglycemia: Secondary | ICD-10-CM

## 2019-09-28 DIAGNOSIS — Z125 Encounter for screening for malignant neoplasm of prostate: Secondary | ICD-10-CM | POA: Diagnosis not present

## 2019-09-28 DIAGNOSIS — E1140 Type 2 diabetes mellitus with diabetic neuropathy, unspecified: Secondary | ICD-10-CM | POA: Diagnosis not present

## 2019-09-28 DIAGNOSIS — IMO0002 Reserved for concepts with insufficient information to code with codable children: Secondary | ICD-10-CM

## 2019-09-28 DIAGNOSIS — Z0000 Encounter for general adult medical examination without abnormal findings: Secondary | ICD-10-CM

## 2019-09-28 LAB — CBC WITH DIFFERENTIAL/PLATELET
Basophils Absolute: 0.1 10*3/uL (ref 0.0–0.1)
Basophils Relative: 0.7 % (ref 0.0–3.0)
Eosinophils Absolute: 0.3 10*3/uL (ref 0.0–0.7)
Eosinophils Relative: 3 % (ref 0.0–5.0)
HCT: 45.3 % (ref 39.0–52.0)
Hemoglobin: 15.2 g/dL (ref 13.0–17.0)
Lymphocytes Relative: 26.1 % (ref 12.0–46.0)
Lymphs Abs: 2.4 10*3/uL (ref 0.7–4.0)
MCHC: 33.7 g/dL (ref 30.0–36.0)
MCV: 98.4 fl (ref 78.0–100.0)
Monocytes Absolute: 0.7 10*3/uL (ref 0.1–1.0)
Monocytes Relative: 7.8 % (ref 3.0–12.0)
Neutro Abs: 5.7 10*3/uL (ref 1.4–7.7)
Neutrophils Relative %: 62.4 % (ref 43.0–77.0)
Platelets: 201 10*3/uL (ref 150.0–400.0)
RBC: 4.6 Mil/uL (ref 4.22–5.81)
RDW: 13 % (ref 11.5–15.5)
WBC: 9.1 10*3/uL (ref 4.0–10.5)

## 2019-09-28 LAB — HEPATIC FUNCTION PANEL
ALT: 23 U/L (ref 0–53)
AST: 15 U/L (ref 0–37)
Albumin: 4.5 g/dL (ref 3.5–5.2)
Alkaline Phosphatase: 71 U/L (ref 39–117)
Bilirubin, Direct: 0.1 mg/dL (ref 0.0–0.3)
Total Bilirubin: 0.7 mg/dL (ref 0.2–1.2)
Total Protein: 6.7 g/dL (ref 6.0–8.3)

## 2019-09-28 LAB — PSA, MEDICARE: PSA: 1.16 ng/ml (ref 0.10–4.00)

## 2019-09-28 LAB — BASIC METABOLIC PANEL
BUN: 24 mg/dL — ABNORMAL HIGH (ref 6–23)
CO2: 22 mEq/L (ref 19–32)
Calcium: 9.4 mg/dL (ref 8.4–10.5)
Chloride: 107 mEq/L (ref 96–112)
Creatinine, Ser: 0.73 mg/dL (ref 0.40–1.50)
GFR: 107.01 mL/min (ref >60.00)
Glucose, Bld: 238 mg/dL — ABNORMAL HIGH (ref 70–99)
Potassium: 5 mEq/L (ref 3.5–5.1)
Sodium: 137 mEq/L (ref 135–145)

## 2019-09-28 LAB — MICROALBUMIN / CREATININE URINE RATIO
Creatinine,U: 105.4 mg/dL
Microalb Creat Ratio: 0.7 mg/g (ref 0.0–30.0)
Microalb, Ur: <0.7 mg/dL (ref 0.0–1.9)

## 2019-09-28 LAB — HEMOGLOBIN A1C: Hgb A1c MFr Bld: 9 % — ABNORMAL HIGH (ref 4.6–6.5)

## 2019-09-28 LAB — LIPID PANEL
Cholesterol: 128 mg/dL (ref 0–200)
HDL: 46.2 mg/dL (ref >39.00)
LDL Cholesterol: 67 mg/dL (ref 0–99)
NonHDL: 82.17
Total CHOL/HDL Ratio: 3
Triglycerides: 76 mg/dL (ref 0.0–149.0)
VLDL: 15.2 mg/dL (ref 0.0–40.0)

## 2019-09-28 NOTE — Progress Notes (Signed)
Connor Gatliff T. Ancil Dewan, MD, [place] at [facility] [place], [address]  Phone: [phone]   FAX: [phone]  Connor Moore - 69 y.o. male   MRN [id number]   Date of Birth: Feb 13, 1950  Date: 09/28/2019   PCP: [name], MD   Referral: [name], MD  Chief Complaint  Patient presents with   Medicare Wellness    This visit occurred during the SARS-CoV-2 public health emergency.  Safety protocols were in place, including screening questions prior to the visit, additional usage of staff PPE, and extensive cleaning of exam room while observing appropriate contact time as indicated for disinfecting solutions.   Patient Care Team: [name], MD as PCP - General Buford Dresser, MD as PCP - Cardiology (Cardiology) [name], [facility] as Pharmacist (Pharmacist) Subjective:   Connor Moore is a 69 y.o. pleasant patient who presents for a medicare wellness examination:  Preventative Health Maintenance Visit:  Health Maintenance Summary Reviewed and updated, unless pt declines services.  Tobacco History Reviewed. Alcohol: No concerns, no excessive use Exercise Habits: Some activity, rec at least 30 mins 5 times a week STD concerns: no risk or activity to increase risk Drug Use: None  Prevnar?  No  Colonoscopy-his last colon was in June 2018. 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  BP is up USing levemir twice a day, 35 units  Stopped BP meds this AM - did not take meds this AM  Health Maintenance  Topic Date Due   FOOT EXAM  07/05/2015   COLONOSCOPY  06/19/2019   INFLUENZA VACCINE  04/14/2020 (Originally 08/16/2019)   PNA vac Low Risk Adult (1 of 2 - PCV13) 09/27/2020 (Originally 03/30/2017)   HEMOGLOBIN A1C  10/07/2019   OPHTHALMOLOGY EXAM  08/05/2020   TETANUS/TDAP  03/30/2025   COVID-19 Vaccine  Completed   Hepatitis C  Screening  Completed    Immunization History  Administered Date(s) Administered   PFIZER SARS-COV-2 Vaccination 02/21/2019, 03/14/2019, 09/14/2019   Tdap 03/31/2015    Patient Active Problem List   Diagnosis Date Noted   Type 2 diabetes, uncontrolled, with neuropathy ([place]) 04/01/2008    Priority: High   Diabetic neuropathy ([place]) 12/07/2010    Priority: Medium   Hyperlipidemia 02/12/2008    Priority: Medium   Erectile dysfunction associated with type 2 diabetes mellitus ([place]) 02/12/2008    Priority: Medium   Essential hypertension 02/12/2008    Priority: Medium   One Testicle, 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 27/06/2374   Diverticulosis    Colon polyps    TOBACCO USE 05/19/2009    Past Medical History:  Diagnosis Date   Colon polyps    Diabetes mellitus type II    Diabetic neuropathy ([place]) 12/07/2010   Diverticulosis    ED (erectile dysfunction)    rare   HLD (hyperlipidemia)    HTN (hypertension)    Obesity     Past Surgical History:  Procedure Laterality Date   APPENDECTOMY     INGUINAL HERNIA REPAIR  1974   KNEE ARTHROSCOPY  1986   Right   Left Hydrocele surgery  1974   REPLACEMENT UNICONDYLAR JOINT KNEE  2009   TESTICLE REMOVAL  1974   Left   TONSILLECTOMY     VASECTOMY  1981    Family History  Problem Relation Age of Onset   Diabetes Father    Cancer Father  mouth   Mental illness Mother        ?    Diabetes Sister    Colon cancer Neg Hx    Esophageal cancer Neg Hx    Rectal cancer Neg Hx    Stomach cancer Neg Hx     Past Medical History, Surgical History, Social History, Family History, Problem List, Medications, and Allergies have been reviewed and updated if relevant.  Review of Systems: Pertinent positives are listed above.  Otherwise, a full 14 point review of systems has been done in full and it is negative except where it is noted positive.  Objective:   BP (!) 158/80    Pulse 62     Temp 98.2 F (36.8 C) (Temporal)    Ht 5\' 11"  (1.803 m)    Wt 212 lb 12 oz (96.5 kg)    SpO2 97%    BMI 29.67 kg/m  Fall Risk  09/28/2019 09/24/2018 04/01/2017  Falls in the past year? 0 0 No   Ideal Body Weight: Weight in (lb) to have BMI = 25: 178.9  Hearing Screening   125Hz  250Hz  500Hz  1000Hz  2000Hz  3000Hz  4000Hz  6000Hz  8000Hz   Right ear:           Left ear:           Comments: Wears Bilateral Hearing Aides  Vision Screening Comments: Eye Exam with Dr. Katy Fitch 08/06/2019. Depression screen [facility] 2/9 09/28/2019 09/24/2018 04/01/2017 09/03/2016  Decreased Interest 0 0 0 0  Down, Depressed, Hopeless 0 0 0 0  PHQ - 2 Score 0 0 0 0     GEN: well developed, well nourished, no acute distress Eyes: conjunctiva and lids normal, PERRLA, EOMI ENT: TM clear, nares clear, oral exam WNL Neck: supple, no lymphadenopathy, no thyromegaly, no JVD Pulm: clear to auscultation and percussion, respiratory effort normal CV: regular rate and rhythm, S1-S2, no murmur, rub or gallop, no bruits, peripheral pulses normal and symmetric, no cyanosis, clubbing, edema or varicosities GI: soft, non-tender; no hepatosplenomegaly, masses; active bowel sounds all quadrants GU: no hernia, testicular mass, penile discharge Lymph: no cervical, axillary or inguinal adenopathy MSK: gait normal, muscle tone and strength WNL, no joint swelling, effusions, discoloration, crepitus  SKIN: clear, good turgor, color WNL, no rashes, lesions, or ulcerations Neuro: normal mental status, normal strength, sensation, and motion Psych: alert; oriented to person, place and time, normally interactive and not anxious or depressed in appearance.  All labs reviewed with patient.  Results for orders placed or performed in visit on 08/10/19  HM DIABETES EYE EXAM  Result Value Ref Range   HM Diabetic Eye Exam No Retinopathy No Retinopathy    Assessment and Plan:     ICD-10-CM   1. Healthcare maintenance  Z00.00   2. Colon cancer screening   Z12.11 Ambulatory referral to Gastroenterology  3. History of colon polyps  Z86.010 Ambulatory referral to Gastroenterology  4. Type 2 diabetes, uncontrolled, with neuropathy (HCC)  A12.87 Basic metabolic panel   O67.67 Hemoglobin A1c    Microalbumin / creatinine urine ratio  5. Essential hypertension  I10   6. Mixed hyperlipidemia  E78.2 Lipid panel  7. Screening PSA (prostate specific antigen)  Z12.5 PSA, Medicare ([place])  8. Encounter for long-term current use of medication  Z79.899 CBC with Differential/Platelet    Hepatic function panel   Globally he is doing ok  He declines pneumonia, flu, singles vaccines  By report BS should be much better  Adjust meds based on labwork Forgot  BP meds  Health Maintenance Exam: The patient's preventative maintenance and recommended screening tests for an annual wellness exam were reviewed in full today. Brought up to date unless services declined.  Counselled on the importance of diet, exercise, and its role in overall health and mortality. The patient's FH and SH was reviewed, including their home life, tobacco status, and drug and alcohol status.  Follow-up in 1 year for physical exam or additional follow-up below.  I have personally reviewed the Medicare Annual Wellness questionnaire and have noted 1. The patient's medical and social history 2. Their use of alcohol, tobacco or illicit drugs 3. Their current medications and supplements 4. The patient's functional ability including ADL's, fall risks, home safety risks and hearing or visual             impairment. 5. Diet and physical activities 6. Evidence for depression or mood disorders 7. Reviewed Updated provider list, see scanned forms and [facility] Snapshot.  8. Reviewed whether or not the patient has HCPOA or living will, and discussed what this means with the patient.  Recommended he bring in a copy for his chart in [facility].  The patients weight, height, BMI and visual acuity have been  recorded in the chart I have made referrals, counseling and provided education to the patient based review of the above and I have provided the pt with a written personalized care plan for preventive services.  I have provided the patient with a copy of your personalized plan for preventive services. Instructed to take the time to review along with their updated medication list.  Follow-up: No follow-ups on file. Or follow-up in 1 year if not noted.  No future appointments.  No orders of the defined types were placed in this encounter.  Medications Discontinued During This Encounter  Medication Reason   Insulin Detemir (LEVEMIR FLEXTOUCH) 726 UNIT/ML Pen Duplicate   Orders Placed This Encounter  Procedures   Basic metabolic panel   CBC with Differential/Platelet   Hepatic function panel   Hemoglobin A1c   Lipid panel   PSA, Medicare ([place])   Microalbumin / creatinine urine ratio   Ambulatory referral to Gastroenterology    Signed,  Frederico Hamman T. Barbera Perritt, MD   Allergies as of 09/28/2019   No Known Allergies     Medication List       Accurate as of September 28, 2019  8:56 AM. If you have any questions, ask your nurse or doctor.        aspirin 81 MG tablet Take 81 mg by mouth daily.   atorvastatin 20 MG tablet Commonly known as: LIPITOR TAKE 1 TABLET BY MOUTH  DAILY   GLUCOMETER ELITE TEST STRIPS test strip Generic drug: glucose blood 1 each by Other route 2 (two) times daily. Use as instructed   Insulin Pen Needle 32G X 5 MM Misc Use one daily along with your insulin pen   Lancets 28G Misc by Does not apply route as directed.   Levemir FlexTouch 100 UNIT/ML FlexPen Generic drug: insulin detemir Inject 35 Units into the skin 2 (two) times daily.   lisinopril 20 MG tablet Commonly known as: ZESTRIL TAKE 1 TABLET BY MOUTH  DAILY   metFORMIN 500 MG 24 hr tablet Commonly known as: GLUCOPHAGE-XR TAKE 4 TABLETS BY MOUTH  DAILY WITH BREAKFAST     VISION FORMULA PO Take 1 tablet by mouth daily.

## 2019-10-12 ENCOUNTER — Other Ambulatory Visit: Payer: Self-pay | Admitting: *Deleted

## 2019-10-12 MED ORDER — LEVEMIR FLEXTOUCH 100 UNIT/ML ~~LOC~~ SOPN: 35.0000 [IU] | PEN_INJECTOR | Freq: Two times a day (BID) | SUBCUTANEOUS | 3 refills | Status: DC

## 2019-10-12 NOTE — Telephone Encounter (Signed)
Patient's wife Connor Moore called stating that her husband's insulin was increased to 35 units twice a day so he is running low on his insulin. Connor Moore stated that he needs a 90 day supply sent to [facility]. Connor Moore stated that her husband got 3 boxes of 5 prefilled pens last time he got his 90 day supply. Connor Moore stated that her husband is due a refill soon and wants the new directions sent in on this refill.

## 2019-10-15 ENCOUNTER — Other Ambulatory Visit: Payer: Self-pay | Admitting: Family Medicine

## 2019-10-20 ENCOUNTER — Other Ambulatory Visit: Payer: Self-pay | Admitting: Family Medicine

## 2019-10-21 ENCOUNTER — Encounter: Payer: Self-pay | Admitting: Family Medicine

## 2019-10-21 MED ORDER — GABAPENTIN 100 MG PO CAPS: 100.0000 mg | ORAL_CAPSULE | Freq: Three times a day (TID) | ORAL | 3 refills | Status: DC

## 2019-11-18 ENCOUNTER — Telehealth: Payer: Self-pay

## 2019-11-18 NOTE — Telephone Encounter (Signed)
Pts wife (DPR signed) left v/m that Dr Lorelei Pont had started pt on Gabapentin 100 mg for neuropathy and pt was notified that the gabapentin refill was denied.  Pt has just worked up to full dosage (Gabapentin 100 mg tid). Mrs Bernhart request cb.  pt has 1 year refill on the gabapentin according to pts med list. I spoke with Santiago Glad at [facility] and tried to run gabapentin 100 mg refill and was denied. Santiago Glad tried again to run med thru and it was approved; Santiago Glad could not explain why it was initially denied but said the gabapentin would be ready for pick up in 1 - 1 1/2 hours. Mrs Paskett notified and voiced understanding and appreciative of getting refill completed. Nothing further needed at this time.

## 2019-12-22 ENCOUNTER — Other Ambulatory Visit: Payer: Self-pay | Admitting: Family Medicine

## 2020-01-21 DIAGNOSIS — L570 Actinic keratosis: Secondary | ICD-10-CM | POA: Diagnosis not present

## 2020-01-21 DIAGNOSIS — X32XXXA Exposure to sunlight, initial encounter: Secondary | ICD-10-CM | POA: Diagnosis not present

## 2020-01-24 NOTE — Progress Notes (Signed)
Connor Wander T. Kairy Folsom, MD, [name] at [facility] [place], [address]  Phone: [phone]   FAX: [phone]  Connor Moore - 70 y.o. male   MRN [id number]   Date of Birth: 09-15-1950  Date: 01/25/2020   PCP: [name], MD   Referral: [name], MD  Chief Complaint  Patient presents with   Diabetes    This visit occurred during the SARS-CoV-2 public health emergency.  Safety protocols were in place, including screening questions prior to the visit, additional usage of staff PPE, and extensive cleaning of exam room while observing appropriate contact time as indicated for disinfecting solutions.   Subjective:   Connor Moore is a 70 y.o. very pleasant male patient with Body mass index is 30.4 kg/m. who presents with the following:  Gene is here to f/u on his DM.  He is currently on Metformin XR 2,000 mg a day Levemir 38 units BID 180-110 BS  Dad is in Hospice right now, and he was not the best with his diet around Thanksgiving and Christmas.  Diabetes Mellitus: Tolerating Medications: yes Compliance with diet: fair, Body mass index is 30.4 kg/m. Foot problems: none Hypoglycemia: none No nausea, vomitting, blurred vision, polyuria.  Lab Results  Component Value Date   HGBA1C 8.9 (A) 01/25/2020   HGBA1C 9.0 (H) 09/28/2019   HGBA1C 9.2 (A) 04/06/2019   Lab Results  Component Value Date   MICROALBUR <0.7 09/28/2019   LDLCALC 67 09/28/2019   CREATININE 0.73 09/28/2019    Wt Readings from Last 3 Encounters:  01/25/20 218 lb (98.9 kg)  09/28/19 212 lb 12 oz (96.5 kg)  04/06/19 212 lb 12 oz (96.5 kg)     Review of Systems is noted in the HPI, as appropriate  Objective:   BP 140/80    Pulse 70    Temp 98 F (36.7 C) (Temporal)    Ht 5\' 11"  (1.803 m)    Wt 218 lb (98.9 kg)    SpO2 96%    BMI 30.40 kg/m   GEN: No acute distress;  alert,appropriate. PULM: Breathing comfortably in no respiratory distress PSYCH: Normally interactive.   Laboratory and Imaging Data: Results for orders placed or performed in visit on 01/25/20  POCT glycosylated hemoglobin (Hb A1C)  Result Value Ref Range   Hemoglobin A1C 8.9 (A) 4.0 - 5.6 %   HbA1c POC (<> result, manual entry)     HbA1c, POC (prediabetic range)     HbA1c, POC (controlled diabetic range)       Assessment and Plan:     ICD-10-CM   1. Type 2 diabetes, uncontrolled, with neuropathy (HCC)  E11.40 POCT glycosylated hemoglobin (Hb A1C)   E11.65    I tried to counsel him about his Dad the best that I could.  Patient Instructions  Increase Levemir to 40 units twice a day  Check blood sugars in the morning and 2 hours after a meal for 2 weeks. If blood sugars are above 150, then increase to 42 units twice a day    No orders of the defined types were placed in this encounter.  There are no discontinued medications. Orders Placed This Encounter  Procedures   POCT glycosylated hemoglobin (Hb A1C)    Follow-up: Return in about 3 months (around 04/24/2020).  Signed,  [name]. Jareth Pardee, MD   Outpatient Encounter Medications as of 01/25/2020  Medication Sig  aspirin 81 MG tablet Take 81 mg by mouth daily.   atorvastatin (LIPITOR) 20 MG tablet TAKE 1 TABLET BY MOUTH  DAILY   gabapentin (NEURONTIN) 100 MG capsule Take 1 capsule (100 mg total) by mouth 3 (three) times daily.   glucose blood test strip 1 each by Other route 2 (two) times daily. Use as instructed   insulin detemir (LEVEMIR FLEXTOUCH) 100 UNIT/ML FlexPen Inject 35 Units into the skin 2 (two) times daily. (Patient taking differently: Inject 38 Units into the skin 2 (two) times daily.)   Insulin Pen Needle 32G X 5 MM MISC Use one daily along with your insulin pen   Lancets 28G MISC by Does not apply route as directed.   lisinopril (ZESTRIL) 20 MG tablet TAKE 1 TABLET BY MOUTH  DAILY    metFORMIN (GLUCOPHAGE-XR) 500 MG 24 hr tablet TAKE 4 TABLETS BY MOUTH  DAILY WITH BREAKFAST   Multiple Vitamins-Minerals (VISION FORMULA PO) Take 1 tablet by mouth daily.   Facility-Administered Encounter Medications as of 01/25/2020  Medication   0.9 %  sodium chloride infusion

## 2020-01-25 ENCOUNTER — Other Ambulatory Visit: Payer: Self-pay

## 2020-01-25 ENCOUNTER — Ambulatory Visit (INDEPENDENT_AMBULATORY_CARE_PROVIDER_SITE_OTHER): Payer: Medicare Other | Admitting: Family Medicine

## 2020-01-25 ENCOUNTER — Encounter: Payer: Self-pay | Admitting: Family Medicine

## 2020-01-25 VITALS — BP 140/80 | HR 70 | Temp 98.0°F | Ht 71.0 in | Wt 218.0 lb

## 2020-01-25 DIAGNOSIS — E1140 Type 2 diabetes mellitus with diabetic neuropathy, unspecified: Secondary | ICD-10-CM

## 2020-01-25 DIAGNOSIS — E1142 Type 2 diabetes mellitus with diabetic polyneuropathy: Secondary | ICD-10-CM

## 2020-01-25 DIAGNOSIS — IMO0002 Reserved for concepts with insufficient information to code with codable children: Secondary | ICD-10-CM

## 2020-01-25 DIAGNOSIS — E1165 Type 2 diabetes mellitus with hyperglycemia: Secondary | ICD-10-CM | POA: Diagnosis not present

## 2020-01-25 LAB — POCT GLYCOSYLATED HEMOGLOBIN (HGB A1C): Hemoglobin A1C: 8.9 % — AB (ref 4.0–5.6)

## 2020-01-25 NOTE — Patient Instructions (Signed)
Increase Levemir to 40 units twice a day  Check blood sugars in the morning and 2 hours after a meal for 2 weeks. If blood sugars are above 150, then increase to 42 units twice a day

## 2020-02-10 ENCOUNTER — Telehealth: Payer: Self-pay | Admitting: Family Medicine

## 2020-02-10 NOTE — Chronic Care Management (AMB) (Signed)
  Chronic Care Management   Note  02/10/2020 Name: TAAHIR GRISBY MRN: [id number] DOB: Jun 06, 1950  SAMULE LIFE is a 70 y.o. year old male who is a primary care patient of Copland, Frederico Hamman, MD. I reached out to [name] by phone today in response to a referral sent by Mr. Bostyn Bogie Amos's PCP, [name], MD.   Mr. Mainwaring was given information about Chronic Care Management services today including:  1. CCM service includes personalized support from designated clinical staff supervised by his physician, including individualized plan of care and coordination with other care providers 2. 24/7 contact phone numbers for assistance for urgent and routine care needs. 3. Service will only be billed when office clinical staff spend 20 minutes or more in a month to coordinate care. 4. Only one practitioner may furnish and bill the service in a calendar month. 5. The patient may stop CCM services at any time (effective at the end of the month) by phone call to the office staff.   Patient agreed to services and verbal consent obtained.   Follow up plan:   [place]

## 2020-03-03 ENCOUNTER — Other Ambulatory Visit: Payer: Self-pay | Admitting: Family Medicine

## 2020-03-03 DIAGNOSIS — IMO0002 Reserved for concepts with insufficient information to code with codable children: Secondary | ICD-10-CM

## 2020-03-03 DIAGNOSIS — E1140 Type 2 diabetes mellitus with diabetic neuropathy, unspecified: Secondary | ICD-10-CM

## 2020-03-03 NOTE — Telephone Encounter (Signed)
Last office visit 01/25/2020 for DM.  Last refilled 10/21/2019 for #90 with 3 refills.  Next Appt: 04/25/2020 for 3 month follow up.

## 2020-03-15 DIAGNOSIS — E118 Type 2 diabetes mellitus with unspecified complications: Secondary | ICD-10-CM | POA: Diagnosis not present

## 2020-03-16 ENCOUNTER — Telehealth: Payer: Self-pay

## 2020-03-16 NOTE — Chronic Care Management (AMB) (Addendum)
     Chronic Care Management Pharmacy Assistant   Name: Connor Moore  MRN: [id number] DOB: [birth year]  Reason for Encounter: Initial questions for CCM visit scheduled 03/21/20  PCP : [name], MD  Allergies:  No Known Allergies  Medications: Outpatient Encounter Medications as of 03/16/2020  Medication Sig   aspirin 81 MG tablet Take 81 mg by mouth daily.   atorvastatin (LIPITOR) 20 MG tablet TAKE 1 TABLET BY MOUTH  DAILY   gabapentin (NEURONTIN) 100 MG capsule TAKE 1 CAPSULE BY MOUTH THREE TIMES A DAY   glucose blood test strip 1 each by Other route 2 (two) times daily. Use as instructed   insulin detemir (LEVEMIR FLEXTOUCH) 100 UNIT/ML FlexPen Inject 35 Units into the skin 2 (two) times daily. (Patient taking differently: Inject 38 Units into the skin 2 (two) times daily.)   Insulin Pen Needle (BD PEN NEEDLE NANO U/F) 32G X 4 MM MISC USE ONE DAILY ALONG WITH YOUR INSULIN PEN   Lancets 28G MISC by Does not apply route as directed.   lisinopril (ZESTRIL) 20 MG tablet TAKE 1 TABLET BY MOUTH  DAILY   metFORMIN (GLUCOPHAGE-XR) 500 MG 24 hr tablet TAKE 4 TABLETS BY MOUTH  DAILY WITH BREAKFAST   Multiple Vitamins-Minerals (VISION FORMULA PO) Take 1 tablet by mouth daily.   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 Multiple messages left to return call. Wanted to remind  patient of upcoming CCM initial appointment.   Message left asking patient to have all medications, supplements and any blood glucose and blood pressure readings available for review with [name], Pharm. D, at his telephone visit on 03/21/20 at 8:30 .   Follow-Up:  Pharmacist Review  [name], CPP notified  Margaretmary Dys, [place] Pharmacy Assistant [phone]

## 2020-03-21 ENCOUNTER — Other Ambulatory Visit: Payer: Self-pay

## 2020-03-21 ENCOUNTER — Ambulatory Visit (INDEPENDENT_AMBULATORY_CARE_PROVIDER_SITE_OTHER): Payer: Medicare Other

## 2020-03-21 DIAGNOSIS — I10 Essential (primary) hypertension: Secondary | ICD-10-CM | POA: Diagnosis not present

## 2020-03-21 DIAGNOSIS — E1165 Type 2 diabetes mellitus with hyperglycemia: Secondary | ICD-10-CM

## 2020-03-21 DIAGNOSIS — E1140 Type 2 diabetes mellitus with diabetic neuropathy, unspecified: Secondary | ICD-10-CM

## 2020-03-21 DIAGNOSIS — IMO0002 Reserved for concepts with insufficient information to code with codable children: Secondary | ICD-10-CM

## 2020-03-21 NOTE — Progress Notes (Signed)
Chronic Care Management Pharmacy Note  03/24/2020 Name:  Connor Moore MRN:  [id number] DOB:  13-Jun-1950  Subjective: Connor Moore is an 70 y.o. year old male who is a primary patient of Copland, Frederico Hamman, MD.  The CCM team was consulted for assistance with disease management and care coordination needs.    Engaged with patient by telephone for initial visit in response to provider referral for pharmacy case management and/or care coordination services.   Consent to Services:  The patient was given the following information about Chronic Care Management services today, agreed to services, and gave verbal consent: 1. CCM service includes personalized support from designated clinical staff supervised by the primary care provider, including individualized plan of care and coordination with other care providers 2. 24/7 contact phone numbers for assistance for urgent and routine care needs. 3. Service will only be billed when office clinical staff spend 20 minutes or more in a month to coordinate care. 4. Only one practitioner may furnish and bill the service in a calendar month. 5.The patient may stop CCM services at any time (effective at the end of the month) by phone call to the office staff. 6. The patient will be responsible for cost sharing (co-pay) of up to 20% of the service fee (after annual deductible is met). Patient agreed to services and consent obtained.  Patient Care Team: [name], MD as PCP - General Buford Dresser, MD as PCP - Cardiology (Cardiology) [name], [facility] as Pharmacist (Pharmacist)  Recent office visits: 01/25/20 - Copland - Increase Levemir to 40 units twice a day. Check blood sugars in the morning and 2 hours after a meal for 2 weeks. If blood sugars are above 150, then increase to 42 units twice a day 10/21/19 - Copland/message - Pt message about neuropathy, start gabapentin 100 mg with titration up to TID. 09/28/19 - Copland/AWV - Forgot  to take BP meds this AM. BG should be improved per report. Due for labs.   Recent consult visits: 08/06/19 - Eye exam   Hospital visits: None in previous 6 months  Objective:  Lab Results  Component Value Date   CREATININE 0.73 09/28/2019   BUN 24 (H) 09/28/2019   GFR 107.01 09/28/2019   GFRNONAA 123.54 04/04/2009   GFRAA 100 03/29/2008   NA 137 09/28/2019   K 5.0 09/28/2019   CALCIUM 9.4 09/28/2019   CO2 22 09/28/2019    Lab Results  Component Value Date/Time   HGBA1C 8.9 (A) 01/25/2020 08:13 AM   HGBA1C 9.0 (H) 09/28/2019 09:01 AM   HGBA1C 9.2 (A) 04/06/2019 08:10 AM   HGBA1C 11.2 (H) 09/24/2018 09:08 AM   GFR 107.01 09/28/2019 09:01 AM   GFR 105.67 09/24/2018 09:08 AM   MICROALBUR <0.7 09/28/2019 09:01 AM   MICROALBUR 0.9 09/24/2018 09:22 AM    Last diabetic Eye exam:  Lab Results  Component Value Date/Time   HMDIABEYEEXA No Retinopathy 08/06/2019 12:00 AM    Last diabetic Foot exam: 01/25/20 per PCP   Lab Results  Component Value Date   CHOL 128 09/28/2019   HDL 46.20 09/28/2019   LDLCALC 67 09/28/2019   LDLDIRECT 51.0 03/25/2017   TRIG 76.0 09/28/2019   CHOLHDL 3 09/28/2019    Hepatic Function Latest Ref Rng & Units 09/28/2019 09/24/2018 03/25/2017  Total Protein 6.0 - 8.3 g/dL 6.7 6.8 6.6  Albumin 3.5 - 5.2 g/dL 4.5 4.4 4.3  AST 0 - 37 U/L 15 26 10   ALT 0 - 53 U/L  23 34 12  Alk Phosphatase 39 - 117 U/L 71 74 65  Total Bilirubin 0.2 - 1.2 mg/dL 0.7 0.8 0.6  Bilirubin, Direct 0.0 - 0.3 mg/dL 0.1 0.2 0.1    No results found for: TSH, FREET4  CBC Latest Ref Rng & Units 09/28/2019 09/24/2018 03/25/2017  WBC 4.0 - 10.5 K/uL 9.1 6.2 9.2  Hemoglobin 13.0 - 17.0 g/dL 15.2 15.7 16.7  Hematocrit 39.0 - 52.0 % 45.3 47.4 49.2  Platelets 150.0 - 400.0 K/uL 201.0 196.0 207.0    No results found for: VD25OH  Clinical ASCVD: No  The ASCVD Risk score Mikey Bussing DC Jr., et al., 2013) failed to calculate for the following reasons:   The valid total cholesterol range is  130 to 320 mg/dL    Depression screen [facility] 2/9 09/28/2019 09/24/2018 04/01/2017  Decreased Interest 0 0 0  Down, Depressed, Hopeless 0 0 0  PHQ - 2 Score 0 0 0    Social History   Tobacco Use  Smoking Status Former Smoker  . Packs/day: 0.75  . Years: 30.00  . Pack years: 22.50  . Types: Cigarettes  . Start date: 08/18/2017  Smokeless Tobacco Never Used  Tobacco Comment   ? quit 2011   BP Readings from Last 3 Encounters:  01/25/20 140/80  09/28/19 (!) 158/80  04/06/19 126/84   Pulse Readings from Last 3 Encounters:  01/25/20 70  09/28/19 62  04/06/19 73   Wt Readings from Last 3 Encounters:  01/25/20 218 lb (98.9 kg)  09/28/19 212 lb 12 oz (96.5 kg)  04/06/19 212 lb 12 oz (96.5 kg)    Assessment/Interventions: Review of patient past medical history, allergies, medications, health status, including review of consultants reports, laboratory and other test data, was performed as part of comprehensive evaluation and provision of chronic care management services.   SDOH:  (Social Determinants of Health) assessments and interventions performed: Yes SDOH Interventions   Flowsheet Row Most Recent Value  SDOH Interventions   Financial Strain Interventions Intervention Not Indicated  [Medications affordable]      CCM Care Plan  No Known Allergies  Medications Reviewed Today    Reviewed by [name], [facility] (Pharmacist) on 03/21/20 at 0855  Med List Status: <None>  Medication Order Taking? Sig Documenting Provider Last Dose Status Informant  0.9 %  sodium chloride infusion [id number]   Milus Banister, MD  Active   aspirin 81 MG tablet [id number] Yes Take 81 mg by mouth daily. [provider] Taking Active   atorvastatin (LIPITOR) 20 MG tablet [id number] Yes TAKE 1 TABLET BY MOUTH  DAILY Copland, Spencer, MD Taking Active   gabapentin (NEURONTIN) 100 MG capsule [id number] Yes TAKE 1 CAPSULE BY MOUTH THREE TIMES A DAY Copland, Spencer, MD Taking Active   glucose blood  test strip [id number] Yes 1 each by Other route 2 (two) times daily. Use as instructed - One Touch Verio [provider] Taking Active   insulin detemir (LEVEMIR FLEXTOUCH) 100 UNIT/ML FlexPen [id number] Yes Inject 35 Units into the skin 2 (two) times daily.  Patient taking differently: Inject 38 Units into the skin 2 (two) times daily.   [name], MD Taking Expired 01/10/20 2359   Insulin Pen Needle (BD PEN NEEDLE NANO U/F) 32G X 4 MM MISC [id number] Yes USE ONE DAILY ALONG WITH YOUR INSULIN PEN Copland, Spencer, MD Taking Active   Lancets 28G [place] [id number] Yes by Does not apply route as directed. [provider] Taking Active   lisinopril (  ZESTRIL) 20 MG tablet [id number] Yes TAKE 1 TABLET BY MOUTH  DAILY Copland, Spencer, MD Taking Active   metFORMIN (GLUCOPHAGE-XR) 500 MG 24 hr tablet [id number] Yes TAKE 4 TABLETS BY MOUTH  DAILY WITH BREAKFAST Copland, Spencer, MD Taking Active   Multiple Vitamin (MULTIVITAMIN WITH MINERALS) TABS tablet [id number] Yes Take 1 tablet by mouth daily. [provider] Taking Active Self  Multiple Vitamins-Minerals (VISION FORMULA PO) [id number] Yes Take 1 tablet by mouth daily. [provider] Taking Active           Patient Active Problem List   Diagnosis Date Noted  . 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 Diverticulosis   . Colon polyps   . Diabetic polyneuropathy associated with type 2 diabetes mellitus ([place]) 12/07/2010  . TOBACCO USE 05/19/2009  . Type 2 diabetes, uncontrolled, with neuropathy ([place]) 04/01/2008  . Hyperlipidemia 02/12/2008  . Erectile dysfunction associated with type 2 diabetes mellitus ([place]) 02/12/2008  . Essential hypertension 02/12/2008    Immunization History  Administered Date(s) Administered  . PFIZER(Purple Top)SARS-COV-2 Vaccination 02/21/2019, 03/14/2019, 09/14/2019  . Tdap 03/31/2015    Conditions to be addressed/monitored:  Hypertension,  Hyperlipidemia and Diabetes  Care Plan : [place]  Updates made by [name], [facility] since 03/24/2020 12:00 AM    Problem: CHL AMB "PATIENT-SPECIFIC PROBLEM"     Long-Range Goal: Disease Management   Start Date: 03/21/2020  Priority: High  Note:   Current Barriers:  . Unable to achieve control of diabetes - fasting blood glucose elevated  Pharmacist Clinical Goal(s):  [name] Over the next 30 days, patient will adhere to plan to optimize therapeutic regimen for diabetes as evidenced by report of adherence to recommended medication management changes through collaboration with PharmD and provider.   Interventions: . 1:1 collaboration with [name], MD regarding development and update of comprehensive plan of care as evidenced by provider attestation and co-signature . Inter-disciplinary care team collaboration (see longitudinal plan of care) . Comprehensive medication review performed; medication list updated in electronic medical record  Hypertension (BP goal <140/90) -Controlled -Current treatment: . Lisinopril 20 mg - 1 tablet daily -Medications previously tried: none -Current home readings: reports his BP has always been good at office with the exception of the last visit; does not have a BP monitor  -Current exercise habits: playing golf 2-3 times a week, rides in cart, mowing yard, keeps the house upkept, does some cooking   -Denies hypotensive/hypertensive symptoms -Educated on Exercise goal of 150 minutes per week; Symptoms of hypotension and importance of maintaining adequate hydration; -Recommended to continue current medication  Hyperlipidemia: (LDL goal < 100) -Controlled -Current treatment: . Atorvastatin 20 mg - 1 tablet daily . Aspirin 81 mg - 1 tablet daily  -Educated on Cholesterol goals;  Benefits of statin for ASCVD risk reduction; -Recommended to continue current medication  Diabetes (A1c goal <7%) -Not ideally controlled  -Current  medications: [name] Metformin 500 XR - 4 tablets daily . Levemir - 42 units BID  **pt increased today to 42 units BID per PCP recommendation at last visit -Medications previously tried: none  -Current home glucose readings - checking every morning . fasting glucose: this morning 143 (increased Levemir 42 units BID this morning) ranging 150-155 on average the past few weeks, up to 170s . post prandial glucose: none  -Denies hypoglycemic/hyperglycemic symptoms -Current meal patterns:   Breakfast: biscuit with banana, coffee  Lunch - sandwich  Supper - spaghetti, large meal,  enjoys cooking, meals vary -Educated onA1c and blood sugar goals; Carbohydrate counting and/or plate method. We discussed limiting portions, add greens  -Counseled to check feet daily and get yearly eye exams -Recommended to continue current medication; CMA review BG log in 4 weeks.  Tobacco use (Goal: Tobacco cesstion) - Currently not using, quit 3 weeks ago - quit cold [place] -Controlled -Previous quit attempts: multiple - reports he goes on and off often -Current treatment  . No pharmacotherapy -Pt to contact if interested in medication or counseling  Patient Goals/Self-Care Activities . Over the next 30 days, patient will:  - check glucose daily, document, and provide at follow up visit with PCP   Other:  Requesting Gabapentin sent to [facility]. Taking gabapentin 100 mg - 2 in the morning and 1 at night, then next day 1 tablet AM and 2 at night. Noticed some improvement. Denies any daytime drowsiness.   Requesting refill on Viagra sent to [facility]. Suggested he ask for a cash price to get the lowest price. Will ask PCP for refill since last filled 09/2018.   Medication Assistance: None required.  Patient affirms current coverage meets needs.     Patient's preferred pharmacy is:  [name], [address], [address] [place], [place] [address] Phone:  [phone] Fax: [phone]  Pt endorses compliance. Denies any issues with current pharmacy.   We discussed: Current pharmacy is preferred with insurance plan and patient is satisfied with pharmacy services Patient decided to: Continue current medication management strategy  Care Plan and Follow Up Patient Decision:  Patient agrees to Care Plan and Follow-up.  Plan: CMA BG log review in May 2022; PCP follow up 04/25/2020.  [name], PharmD Clinical Pharmacist [facility] at [facility] [phone]

## 2020-03-24 ENCOUNTER — Other Ambulatory Visit: Payer: Self-pay

## 2020-03-24 MED ORDER — GABAPENTIN 100 MG PO CAPS
ORAL_CAPSULE | ORAL | 1 refills | Status: DC
Start: 2020-03-24 — End: 2020-08-16

## 2020-03-24 NOTE — Telephone Encounter (Signed)
Pt requesting a refill on Viagra. Last active in chart 09/2018. He would like it sent to [facility] if approved.   [name], PharmD Clinical Pharmacist [facility] at [facility] [phone]

## 2020-03-24 NOTE — Progress Notes (Signed)
I have personally reviewed this encounter including the documentation in this note and have collaborated with the care management provider regarding care management and care coordination activities to include development and update of the comprehensive care plan. I am certifying that I agree with the content of this note and encounter as supervising physician.    

## 2020-03-24 NOTE — Patient Instructions (Signed)
Dear Connor Moore,  It was a pleasure meeting you during our initial appointment on March 21, 2020. Below is a summary of the goals we discussed and components of chronic care management. Please contact me anytime with questions or concerns.   Visit Information  Patient Care Plan: CCM Pharmacy Care Plan    Problem Identified: CHL AMB "PATIENT-SPECIFIC PROBLEM"     Long-Range Goal: Disease Management   Start Date: 03/21/2020  Priority: High  Note:   Current Barriers:  . Unable to achieve control of diabetes - fasting blood glucose elevated  Pharmacist Clinical Goal(s):  [name] Over the next 30 days, patient will adhere to plan to optimize therapeutic regimen for diabetes as evidenced by report of adherence to recommended medication management changes through collaboration with PharmD and provider.   Interventions: . 1:1 collaboration with [name], MD regarding development and update of comprehensive plan of care as evidenced by provider attestation and co-signature . Inter-disciplinary care team collaboration (see longitudinal plan of care) . Comprehensive medication review performed; medication list updated in electronic medical record  Hypertension (BP goal <140/90) -Controlled -Current treatment: . Lisinopril 20 mg - 1 tablet daily -Medications previously tried: none -Current home readings: reports his BP has always been good at office with the exception of the last visit; does not have a BP monitor  -Current exercise habits: playing golf 2-3 times a week, rides in cart, mowing yard, keeps the house upkept, does some cooking   -Denies hypotensive/hypertensive symptoms -Educated on Exercise goal of 150 minutes per week; Symptoms of hypotension and importance of maintaining adequate hydration; -Recommended to continue current medication  Hyperlipidemia: (LDL goal < 100) -Controlled -Current treatment: . Atorvastatin 20 mg - 1 tablet daily . Aspirin 81 mg - 1 tablet  daily  -Educated on Cholesterol goals;  Benefits of statin for ASCVD risk reduction; -Recommended to continue current medication  Diabetes (A1c goal <7%) -Not ideally controlled  -Current medications: [name] Metformin 500 XR - 4 tablets daily . Levemir - 42 units BID  **pt increased today to 42 units BID per PCP recommendation at last visit -Medications previously tried: none  -Current home glucose readings - checking every morning . fasting glucose: this morning 143 (increased Levemir 42 units BID this morning) ranging 150-155 on average the past few weeks, up to 170s . post prandial glucose: none  -Denies hypoglycemic/hyperglycemic symptoms -Current meal patterns:   Breakfast: biscuit with banana, coffee  Lunch - sandwich  Supper - spaghetti, large meal, enjoys cooking, meals vary -Educated onA1c and blood sugar goals; Carbohydrate counting and/or plate method. We discussed limiting portions, add greens  -Counseled to check feet daily and get yearly eye exams -Recommended to continue current medication; CMA review BG log in 4 weeks.  Tobacco use (Goal: Tobacco cesstion) - Currently not using, quit 3 weeks ago - quit cold [place] -Controlled -Previous quit attempts: multiple - reports he goes on and off often -Current treatment  . No pharmacotherapy -Pt to contact if interested in medication or counseling  Patient Goals/Self-Care Activities . Over the next 30 days, patient will:  - check glucose daily, document, and provide at follow up visit with PCP   Other:  Requesting Gabapentin sent to [facility]. Taking gabapentin 100 mg - 2 in the morning and 1 at night, then next day 1 tablet AM and 2 at night. Noticed some improvement. Denies any daytime drowsiness.   Requesting refill on Viagra sent to [facility]. Suggested he ask for a cash price  to get the lowest price. Will ask PCP for refill since last filled 09/2018.   Medication Assistance: None required.  Patient affirms current  coverage meets needs.     Mr. Delduca was given information about Chronic Care Management services today including:  1. CCM service includes personalized support from designated clinical staff supervised by his physician, including individualized plan of care and coordination with other care providers 2. 24/7 contact phone numbers for assistance for urgent and routine care needs. 3. Standard insurance, coinsurance, copays and deductibles apply for chronic care management only during months in which we provide at least 20 minutes of these services. Most insurances cover these services at 100%, however patients may be responsible for any copay, coinsurance and/or deductible if applicable. This service may help you avoid the need for more expensive face-to-face services. 4. Only one practitioner may furnish and bill the service in a calendar month. 5. The patient may stop CCM services at any time (effective at the end of the month) by phone call to the office staff.  Patient agreed to services and verbal consent obtained.   The patient verbalized understanding of instructions, educational materials, and care plan provided today and agreed to receive a mailed copy of patient instructions, educational materials, and care plan.  Telephone follow up appointment with pharmacy team member scheduled for: May 2022 Blood glucose log review  [name], PharmD Clinical Pharmacist [facility] at [facility] [phone]  Preventing Hypoglycemia Hypoglycemia occurs when the level of sugar (glucose) in the blood is too low. Hypoglycemia can happen in people who do or do not have diabetes (diabetes mellitus). It can develop quickly, and it can be a medical emergency. For most people with diabetes, a blood glucose level below 70 mg/dL (3.9 mmol/L) is considered hypoglycemia. Glucose is a type of sugar that provides the body's main source of energy. Certain hormones (insulin and glucagon) control  the level of glucose in the blood. Insulin lowers blood glucose, and glucagon increases blood glucose. Hypoglycemia can result from having too much insulin in the bloodstream, or from not eating enough food that contains glucose. Your risk for hypoglycemia is higher:  If you take insulin or diabetes medicines to help lower your blood glucose or help your body make more insulin.  If you skip or delay a meal or snack.  If you are ill.  During and after exercise. You can prevent hypoglycemia by working with your health care provider to adjust your meal plan as needed and by taking other precautions. How can hypoglycemia affect me? Mild symptoms Mild hypoglycemia may not cause any symptoms. If you do have symptoms, they may include:  Hunger.  Anxiety.  Sweating and feeling clammy.  Dizziness or feeling light-headed.  Sleepiness.  Nausea.  Increased heart rate.  Headache.  Blurry vision.  Irritability.  Tingling or numbness around the mouth, lips, or tongue.  A change in coordination.  Restless sleep. If mild hypoglycemia is not recognized and treated, it can quickly become moderate or severe hypoglycemia. Moderate symptoms Moderate hypoglycemia can cause:  Mental confusion and poor judgment.  Behavior changes.  Weakness.  Irregular heartbeat. Severe symptoms Severe hypoglycemia is a medical emergency. It can cause:  Fainting.  Seizures.  Loss of consciousness (coma).  Death. What nutrition changes can be made?  Work with your health care provider or diet and nutrition specialist (dietitian) to make a healthy meal plan that is right for you. Follow your meal plan carefully.  Eat meals  at regular times.  If recommended by your health care provider, have snacks between meals.  Donot skip or delay meals or snacks. You can be at risk for hypoglycemia if you are not getting enough carbohydrates. What lifestyle changes can be made?  Work closely with your  health care provider to manage your blood glucose. Make sure you know: ? Your goal blood glucose levels. ? How and when to check your blood glucose. ? The symptoms of hypoglycemia. It is important to treat it right away to keep it from becoming severe.  Do not drink alcohol on an empty stomach.  When you are ill, check your blood glucose more often than usual. Follow your sick day plan whenever you cannot eat or drink normally. Make this plan in advance with your health care provider.  Always check your blood glucose before, during, and after exercise.   How is this treated? This condition can often be treated by immediately eating or drinking something that contains sugar, such as:  Fruit juice, 4-6 oz (120-150 mL).  Regular (not diet) soda, 4-6 oz (120-150 mL).  Low-fat milk, 4 oz (120 mL).  Several pieces of hard candy.  Sugar or honey, 1 Tbsp (15 mL). Treating hypoglycemia if you have diabetes If you are alert and able to swallow safely, follow the 15:15 rule:  Take 15 grams of a rapid-acting carbohydrate. Talk with your health care provider about how much you should take.  Rapid-acting options include: ? Glucose pills (take 15 grams). ? 6-8 pieces of hard candy. ? 4-6 oz (120-150 mL) of fruit juice. ? 4-6 oz (120-150 mL) of regular (not diet) soda.  Check your blood glucose 15 minutes after you take the carbohydrate.  If the repeat blood glucose level is still at or below 70 mg/dL (3.9 mmol/L), take 15 grams of a carbohydrate again.  If your blood glucose level does not increase above 70 mg/dL (3.9 mmol/L) after 3 tries, seek emergency medical care.  After your blood glucose level returns to normal, eat a meal or a snack within 1 hour. Treating severe hypoglycemia Severe hypoglycemia is when your blood glucose level is at or below 54 mg/dL (3 mmol/L). Severe hypoglycemia is a medical emergency. Get medical help right away. If you have severe hypoglycemia and you cannot  eat or drink, you may need an injection of glucagon. A family member or close friend should learn how to check your blood glucose and how to give you a glucagon injection. Ask your health care provider if you need to have an emergency glucagon injection kit available. Severe hypoglycemia may need to be treated in a hospital. The treatment may include getting glucose through an IV. You may also need treatment for the cause of your hypoglycemia. Where to find more information  American Diabetes Association: www.diabetes.[employer] of Diabetes and Digestive and Kidney Diseases: [employer] Contact a health care provider if:  You have problems keeping your blood glucose in your target range.  You have frequent episodes of hypoglycemia. Get help right away if:  You continue to have hypoglycemia symptoms after eating or drinking something containing glucose.  Your blood glucose level is at or below 54 mg/dL (3 mmol/L).  You faint.  You have a seizure. These symptoms may represent a serious problem that is an emergency. Do not wait to see if the symptoms will go away. Get medical help right away. Call your local emergency services (911 in the [place] Summary  Know the  symptoms of hypoglycemia, and when you are at risk for it (such as during exercise or when you are sick). Check your blood glucose often when you are at risk for hypoglycemia.  Hypoglycemia can develop quickly, and it can be dangerous if it is not treated right away. If you have a history of severe hypoglycemia, make sure you know how to use your glucagon injection kit.  Make sure you know how to treat hypoglycemia. Keep a carbohydrate snack available when you may be at risk for hypoglycemia. This information is not intended to replace advice given to you by your health care provider. Make sure you discuss any questions you have with your health care provider. Document Revised: 04/25/2018 Document Reviewed:  08/29/2016 Elsevier Patient Education  2021 [employer].

## 2020-03-24 NOTE — Telephone Encounter (Signed)
-----   Message from [place], [facility] sent at 03/24/2020  3:31 PM EST ----- Regarding: Refill Pt would like gabapentin sent to [facility], currently at [facility]. Thanks!  [name], PharmD Clinical Pharmacist [facility] at [facility] [phone]

## 2020-03-24 NOTE — Telephone Encounter (Addendum)
Sildenafil not on current medication list.  Last office visit 01/25/2020 for DM.  Gabapentin last refilled 03/03/2020 for #90 with 5 refills.  Patient is requesting refill be sent to [facility].

## 2020-04-19 ENCOUNTER — Telehealth: Payer: Self-pay

## 2020-04-19 NOTE — Telephone Encounter (Signed)
Can you call him?  Sildenafil 20 mg is not FDA approved for ED.  You cannot use your insurance for it unless you have pulmonary hypertension. (he doesn't)  You can usually get it for a little over a dollar locally, but he should call around.  I am happy to send it in.

## 2020-04-19 NOTE — Telephone Encounter (Signed)
Pt left v/m requesting to speak with [name] pharmacist about 03/24/20 viagra was not on auto refill and Sharyn Lull thought she could possibly get put on auto refill and pt has not heard back yet and request cb for status of that request. Sending note to Mrs Andree Elk.

## 2020-04-19 NOTE — Telephone Encounter (Signed)
Pt called again to request refill on sildenafil 20 mg PRN. A telephone note was sent to [place] on 03/24/20 but I do not see that an rx was sent.  Last active in chart 09/2018. If you approve a refill please send to [facility] Mail order.   [name], PharmD Clinical Pharmacist [facility] at [facility] [phone]

## 2020-04-19 NOTE — Telephone Encounter (Signed)
Per Sharyn Lull, patient really wants Rx to go to mail order pharmacy but I did leave a message to let him know insurance will not cover it so he will have to pay out of pocket.

## 2020-04-19 NOTE — Telephone Encounter (Signed)
Left message for Connor Moore with below information from Dr. Lorelei Pont.  I ask patient to call me back if he would still like Dr. Lorelei Pont to send in Rx for him and if he would prefer to try a local pharmacy.  Message was routed to Dr. Lorelei Pont on 03/24/2020 but only the Gabapentin was sent in.    FYI to [place].

## 2020-04-20 MED ORDER — SILDENAFIL CITRATE 100 MG PO TABS: 50.0000 mg | ORAL_TABLET | Freq: Every day | ORAL | 3 refills | Status: DC | PRN

## 2020-04-20 NOTE — Telephone Encounter (Signed)
OK, I did  I sent him in Viagra 100 mg.  1/2 to 1 tab prior to intercourse.  The computer tells me this is on his formulary.

## 2020-04-20 NOTE — Telephone Encounter (Signed)
Spoke with patient's wife regarding Sildenafil autorefill. Let her know the rx was sent to mail order yesterday by PCP. It may not be covered by insurance. She will call if any cost concerns.  [name], PharmD Clinical Pharmacist [facility] at [facility] [phone]

## 2020-04-25 ENCOUNTER — Other Ambulatory Visit: Payer: Self-pay

## 2020-04-25 ENCOUNTER — Encounter: Payer: Self-pay | Admitting: Family Medicine

## 2020-04-25 ENCOUNTER — Ambulatory Visit (INDEPENDENT_AMBULATORY_CARE_PROVIDER_SITE_OTHER): Payer: Medicare Other | Admitting: Family Medicine

## 2020-04-25 VITALS — BP 140/62 | HR 61 | Temp 98.1°F | Ht 71.0 in | Wt 224.8 lb

## 2020-04-25 DIAGNOSIS — IMO0002 Reserved for concepts with insufficient information to code with codable children: Secondary | ICD-10-CM

## 2020-04-25 DIAGNOSIS — E1140 Type 2 diabetes mellitus with diabetic neuropathy, unspecified: Secondary | ICD-10-CM

## 2020-04-25 DIAGNOSIS — E1165 Type 2 diabetes mellitus with hyperglycemia: Secondary | ICD-10-CM | POA: Diagnosis not present

## 2020-04-25 LAB — POCT GLYCOSYLATED HEMOGLOBIN (HGB A1C): Hemoglobin A1C: 9.1 % — AB (ref 4.0–5.6)

## 2020-04-25 NOTE — Progress Notes (Signed)
Connor Lares T. Gagandeep Kossman, MD, [place] at [facility] [place], [address]  Phone: [phone]  FAX: [phone]  [name] - 70 y.o. male  MRN [id number]  Date of Birth: Jun 10, 1950  Date: 04/25/2020  PCP: [name], MD  Referral: [name], MD  Chief Complaint  Patient presents with  . Diabetes    This visit occurred during the SARS-CoV-2 public health emergency.  Safety protocols were in place, including screening questions prior to the visit, additional usage of staff PPE, and extensive cleaning of exam room while observing appropriate contact time as indicated for disinfecting solutions.   Subjective:   Connor Moore is a 70 y.o. very pleasant male patient with Body mass index is 31.35 kg/m. who presents with the following:  Diabetes Mellitus: Tolerating Medications: yes Compliance with diet: fair, Body mass index is 31.35 kg/m. Exercise: minimal / intermittent Avg blood sugars at home: 160-180 Foot problems: none Hypoglycemia: none No nausea, vomitting, blurred vision, polyuria.  Metformin 2,000 mg daily Levemir 44 units BID  160-180 when he checks it.   Not playing golf now right now.   Yesterday had some grits, eggs Fish at lunch, fries, hushpuppies.  Salad   Wants to try some intermittent fasting - but not sure if he can do it.   Lab Results  Component Value Date   HGBA1C 9.1 (A) 04/25/2020   HGBA1C 8.9 (A) 01/25/2020   HGBA1C 9.0 (H) 09/28/2019   Lab Results  Component Value Date   MICROALBUR <0.7 09/28/2019   LDLCALC 67 09/28/2019   CREATININE 0.73 09/28/2019    Wt Readings from Last 3 Encounters:  04/25/20 224 lb 12 oz (101.9 kg)  01/25/20 218 lb (98.9 kg)  09/28/19 212 lb 12 oz (96.5 kg)     Review of Systems is noted in the HPI, as appropriate  Objective:   BP 140/62   Pulse 61   Temp 98.1 F (36.7 C) (Temporal)    Ht 5\' 11"  (1.803 m)   Wt 224 lb 12 oz (101.9 kg)   SpO2 97%   BMI 31.35 kg/m   GEN: No acute distress; alert,appropriate. PULM: Breathing comfortably in no respiratory distress PSYCH: Normally interactive.   Laboratory and Imaging Data: Results for orders placed or performed in visit on 04/25/20  POCT glycosylated hemoglobin (Hb A1C)  Result Value Ref Range   Hemoglobin A1C 9.1 (A) 4.0 - 5.6 %   HbA1c POC (<> result, manual entry)     HbA1c, POC (prediabetic range)     HbA1c, POC (controlled diabetic range)       Assessment and Plan:     ICD-10-CM   1. Type 2 diabetes, uncontrolled, with neuropathy (HCC)  E11.40 POCT glycosylated hemoglobin (Hb A1C)   E11.65    DM with fair control only He is still having difficulty maintaining his blood sugar.  He has gained some weight, and he is also less physically active.  Try to lose weight, increase activity, improved with diet.  Increase insulin dosing  Patient Instructions  Increase your insulin to 46 units twice a day  If your blood sugar is above 150, then increase to 48 units twice a day    No orders of the defined types were placed in this encounter.  Medications Discontinued During This Encounter  Medication Reason  . insulin detemir (LEVEMIR FLEXTOUCH) 100 UNIT/ML FlexPen Duplicate   Orders Placed This  Encounter  Procedures  . POCT glycosylated hemoglobin (Hb A1C)    Follow-up: Return in about 3 months (around 07/25/2020) for diabetes.  Signed,  [name]. Rasheida Broden, MD   Outpatient Encounter Medications as of 04/25/2020  Medication Sig  . aspirin 81 MG tablet Take 81 mg by mouth daily.  [name] atorvastatin (LIPITOR) 20 MG tablet TAKE 1 TABLET BY MOUTH  DAILY  . gabapentin (NEURONTIN) 100 MG capsule TAKE 1 CAPSULE BY MOUTH THREE TIMES A DAY  . glucose blood test strip 1 each by Other route 2 (two) times daily. Use as instructed - One Touch Verio  . insulin detemir (LEVEMIR FLEXTOUCH) 100 UNIT/ML FlexPen Inject 44  Units into the skin 2 (two) times daily.  . Insulin Pen Needle (BD PEN NEEDLE NANO U/F) 32G X 4 MM MISC USE ONE DAILY ALONG WITH YOUR INSULIN PEN  . Lancets 28G MISC by Does not apply route as directed.  [name] lisinopril (ZESTRIL) 20 MG tablet TAKE 1 TABLET BY MOUTH  DAILY  . metFORMIN (GLUCOPHAGE-XR) 500 MG 24 hr tablet TAKE 4 TABLETS BY MOUTH  DAILY WITH BREAKFAST  . Multiple Vitamin (MULTIVITAMIN WITH MINERALS) TABS tablet Take 1 tablet by mouth daily.  . Multiple Vitamins-Minerals (VISION FORMULA PO) Take 1 tablet by mouth daily.  . sildenafil (VIAGRA) 100 MG tablet Take 0.5-1 tablets (50-100 mg total) by mouth daily as needed for erectile dysfunction.  . [DISCONTINUED] insulin detemir (LEVEMIR FLEXTOUCH) 100 UNIT/ML FlexPen Inject 35 Units into the skin 2 (two) times daily. (Patient taking differently: Inject 44 Units into the skin 2 (two) times daily.)   Facility-Administered Encounter Medications as of 04/25/2020  Medication  . 0.9 %  sodium chloride infusion

## 2020-04-25 NOTE — Patient Instructions (Signed)
Increase your insulin to 46 units twice a day  If your blood sugar is above 150, then increase to 48 units twice a day

## 2020-05-17 ENCOUNTER — Telehealth: Payer: Self-pay

## 2020-05-17 NOTE — Chronic Care Management (AMB) (Addendum)
Chronic Care Management Pharmacy Assistant   Name: Connor Moore  MRN: [id number] DOB: 1950/11/02 .  Reason for Encounter: Disease State DM   Conditions to be addressed/monitored: DMII  Recent office visits:  04/25/2020  Dr.Spencer Copland PCP - Increase your insulin to 46 units twice a day. If your blood sugar is above 150, then increase to 48 units twice a day.  Recent consult visits:  None since last CCM contact  Hospital visits:  None in previous 6 months  Medications: Outpatient Encounter Medications as of 05/17/2020  Medication Sig   aspirin 81 MG tablet Take 81 mg by mouth daily.   atorvastatin (LIPITOR) 20 MG tablet TAKE 1 TABLET BY MOUTH  DAILY   gabapentin (NEURONTIN) 100 MG capsule TAKE 1 CAPSULE BY MOUTH THREE TIMES A DAY   glucose blood test strip 1 each by Other route 2 (two) times daily. Use as instructed - One Touch Verio   insulin detemir (LEVEMIR FLEXTOUCH) 100 UNIT/ML FlexPen Inject 44 Units into the skin 2 (two) times daily.   Insulin Pen Needle (BD PEN NEEDLE NANO U/F) 32G X 4 MM MISC USE ONE DAILY ALONG WITH YOUR INSULIN PEN   Lancets 28G MISC by Does not apply route as directed.   lisinopril (ZESTRIL) 20 MG tablet TAKE 1 TABLET BY MOUTH  DAILY   metFORMIN (GLUCOPHAGE-XR) 500 MG 24 hr tablet TAKE 4 TABLETS BY MOUTH  DAILY WITH BREAKFAST   Multiple Vitamin (MULTIVITAMIN WITH MINERALS) TABS tablet Take 1 tablet by mouth daily.   Multiple Vitamins-Minerals (VISION FORMULA PO) Take 1 tablet by mouth daily.   sildenafil (VIAGRA) 100 MG tablet Take 0.5-1 tablets (50-100 mg total) by mouth daily as needed for erectile dysfunction.   Facility-Administered Encounter Medications as of 05/17/2020  Medication   0.9 %  sodium chloride infusion    Recent Relevant Labs: Lab Results  Component Value Date/Time   HGBA1C 9.1 (A) 04/25/2020 08:20 AM   HGBA1C 8.9 (A) 01/25/2020 08:13 AM   HGBA1C 9.0 (H) 09/28/2019 09:01 AM   HGBA1C 11.2 (H) 09/24/2018 09:08 AM    MICROALBUR <0.7 09/28/2019 09:01 AM   MICROALBUR 0.9 09/24/2018 09:22 AM    Kidney Function Lab Results  Component Value Date/Time   CREATININE 0.73 09/28/2019 09:01 AM   CREATININE 0.74 09/24/2018 09:08 AM   GFR 107.01 09/28/2019 09:01 AM   GFRNONAA 123.54 04/04/2009 08:32 AM   GFRAA 100 03/29/2008 10:45 AM   Current antihyperglycemic regimen:        Metformin 500 mg - 4 tablets daily        Levemir - per the patient he is taking 36 units 2 times a day     Patient verbally confirms he is taking the above medications as directed. Yes  What recent interventions/DTPs have been made to improve glycemic control:   PCP visit 04/25/20 - Increase your insulin to 46 units twice a day  If your blood sugar is above 150, then increase to 48 units twice a day. Per the patient he is taking only 36 units 2 times a day.  Have there been any recent hospitalizations or ED visits since last visit with CPP? No  Patient denies hypoglycemic symptoms, including Pale, Sweaty, Shaky, Hungry, Nervous/irritable and Vision changes  Patient denies hyperglycemic symptoms, including blurry vision, excessive thirst, fatigue, polyuria and weakness  How often are you checking your blood sugar? in the morning before eating or drinking  What are your blood sugars ranging?  Fasting: 04/27/2020 84 04/28/2020 127 04/29/2020 79 04/30/2020 92 05/01/2020 93 05/02/2020 140        05/09/2020 82 05/10/2020 108 05/11/2020 114 05/12/2020 71 05/13/2020 95 05/14/2020 80 05/15/2020 74 05/16/2020 104 05/17/2020 91 05/19/2020 101 Before meals: n/a After meals: n/a Bedtime: n/a  On insulin? Yes  How many units: Levemir - per the patient he injects 36 units 2 times a day .  During the week, how often does your blood glucose drop below 70? Never - lowest was on 05/12/2020  BG 71, patient felt great  Are you checking your feet daily/regularly? Yes  Adherence Review: Is the patient currently on a STATIN medication? Yes Is the  patient currently on ACE/ARB medication? Yes Does the patient have >5 day gap between last estimated fill dates? No  Star Rating Drugs:  Medication:  Last Fill: Day Supply Atorvastatin 20mg . 04/29/2020 90ds Metformin 500mg . 04/13/2020 90ds Lisinopril 20mg . 05/04/2020 90ds   Follow-Up:  Pharmacist Review  [name], CPP notified  Avel Sensor [facility] Clinical Pharmacy Assistant [phone]  I have reviewed the care management and care coordination activities outlined in this encounter and I am certifying that I agree with the content of this note. No further action required.  [name], PharmD Clinical Pharmacist [facility] at [facility] [phone]

## 2020-06-10 ENCOUNTER — Other Ambulatory Visit: Payer: Self-pay | Admitting: Family Medicine

## 2020-06-20 ENCOUNTER — Telehealth: Payer: Self-pay

## 2020-06-20 NOTE — Chronic Care Management (AMB) (Addendum)
    Chronic Care Management Pharmacy Assistant   Name: Connor Moore  MRN: [id number] DOB: [birth year]  Reason for Encounter: Disease State - Diabetes   Recent office visits:  None since last CCM contact  Recent consult visits:  None since last CCM contact  Hospital visits:  None in previous 6 months  Medications: Outpatient Encounter Medications as of 06/20/2020  Medication Sig   aspirin 81 MG tablet Take 81 mg by mouth daily.   atorvastatin (LIPITOR) 20 MG tablet TAKE 1 TABLET BY MOUTH  DAILY   gabapentin (NEURONTIN) 100 MG capsule TAKE 1 CAPSULE BY MOUTH THREE TIMES A DAY   glucose blood test strip 1 each by Other route 2 (two) times daily. Use as instructed - One Touch Verio   insulin detemir (LEVEMIR FLEXTOUCH) 100 UNIT/ML FlexPen Inject 44 Units into the skin 2 (two) times daily.   Insulin Pen Needle (BD PEN NEEDLE NANO U/F) 32G X 4 MM MISC USE ONE DAILY ALONG WITH YOUR INSULIN PEN   Lancets 28G MISC by Does not apply route as directed.   lisinopril (ZESTRIL) 20 MG tablet TAKE 1 TABLET BY MOUTH  DAILY   metFORMIN (GLUCOPHAGE-XR) 500 MG 24 hr tablet TAKE 4 TABLETS BY MOUTH  DAILY WITH BREAKFAST   Multiple Vitamin (MULTIVITAMIN WITH MINERALS) TABS tablet Take 1 tablet by mouth daily.   Multiple Vitamins-Minerals (VISION FORMULA PO) Take 1 tablet by mouth daily.   sildenafil (VIAGRA) 100 MG tablet Take 0.5-1 tablets (50-100 mg total) by mouth daily as needed for erectile dysfunction.   Facility-Administered Encounter Medications as of 06/20/2020  Medication   0.9 %  sodium chloride infusion    Recent Relevant Labs: Lab Results  Component Value Date/Time   HGBA1C 9.1 (A) 04/25/2020 08:20 AM   HGBA1C 8.9 (A) 01/25/2020 08:13 AM   HGBA1C 9.0 (H) 09/28/2019 09:01 AM   HGBA1C 11.2 (H) 09/24/2018 09:08 AM   MICROALBUR <0.7 09/28/2019 09:01 AM   MICROALBUR 0.9 09/24/2018 09:22 AM    Kidney Function Lab Results  Component Value Date/Time   CREATININE 0.73 09/28/2019 09:01  AM   CREATININE 0.74 09/24/2018 09:08 AM   GFR 107.01 09/28/2019 09:01 AM   GFRNONAA 123.54 04/04/2009 08:32 AM   GFRAA 100 03/29/2008 10:45 AM   Attempted contact with [facility] 3 times on 06/28/20, 07/01/20, 07/06/20. Unsuccessful outreach. Will attempt contact next month.  Adherence reviewed.  Adherence Review: Is the patient currently on a STATIN medication? Yes Is the patient currently on ACE/ARB medication? Yes Does the patient have >5 day gap between last estimated fill dates? No  Star Rating Drugs:  Medication:  Last Fill: Day Supply Atorvastatin 20mg .      04/29/2020        90ds Metformin 500mg .       04/13/2020        90ds Lisinopril 20mg .           05/04/2020        90ds Levemir 100 unit/ml 05/04/2020 108ds Januvia 100mg  06/01/2020 90ds  Glipizide 10mg  06/01/2020 90ds   Follow-Up:  Pharmacist Review  [name], CPP notified  Avel Sensor [facility] Clinical Pharmacy Assistant [phone]  I have reviewed the care management and care coordination activities outlined in this encounter and I am certifying that I agree with the content of this note. No adherence concerns at this time.   [name], PharmD Clinical Pharmacist [facility] at [facility] [phone]

## 2020-06-21 DIAGNOSIS — E119 Type 2 diabetes mellitus without complications: Secondary | ICD-10-CM | POA: Diagnosis not present

## 2020-06-21 DIAGNOSIS — H35033 Hypertensive retinopathy, bilateral: Secondary | ICD-10-CM | POA: Diagnosis not present

## 2020-06-21 DIAGNOSIS — T1501XA Foreign body in cornea, right eye, initial encounter: Secondary | ICD-10-CM | POA: Diagnosis not present

## 2020-06-21 DIAGNOSIS — Z961 Presence of intraocular lens: Secondary | ICD-10-CM | POA: Diagnosis not present

## 2020-06-22 DIAGNOSIS — T1501XD Foreign body in cornea, right eye, subsequent encounter: Secondary | ICD-10-CM | POA: Diagnosis not present

## 2020-07-26 NOTE — Progress Notes (Signed)
Connor Marmo T. Connor Loor, MD, [place] at [facility] [place], [address]  Phone: [phone]  FAX: [phone]  Connor Moore - 70 y.o. male  MRN [id number]  Date of Birth: December 01, 1950  Date: 07/27/2020  PCP: [name], MD  Referral: [name], MD  Chief Complaint  Patient presents with   Diabetes    This visit occurred during the SARS-CoV-2 public health emergency.  Safety protocols were in place, including screening questions prior to the visit, additional usage of staff PPE, and extensive cleaning of exam room while observing appropriate contact time as indicated for disinfecting solutions.   Subjective:   Connor Moore is a 70 y.o. very pleasant male patient with Body mass index is 30.2 kg/m. who presents with the following:  Gene is here to talk about his diabetes.  He is globally not done all that well with that over time.  We have had to titrate up his insulin, and he still has had elevated blood sugars.  I have continued him on metformin as well.  Diabetes Mellitus: Tolerating Medications: yes Compliance with diet: fair, Body mass index is 30.2 kg/m. Exercise: minimal / intermittent Avg blood sugars at home: AM only Foot problems: none Hypoglycemia: none Occ nausea  40 units in the AM and 40 in the PM - 80 sometimes in the morning He is occasionally nauseous.   Lab Results  Component Value Date   HGBA1C 8.5 (A) 07/27/2020   HGBA1C 9.1 (A) 04/25/2020   HGBA1C 8.9 (A) 01/25/2020   Lab Results  Component Value Date   MICROALBUR <0.7 09/28/2019   LDLCALC 67 09/28/2019   CREATININE 0.73 09/28/2019    Wt Readings from Last 3 Encounters:  07/27/20 216 lb 8 oz (98.2 kg)  04/25/20 224 lb 12 oz (101.9 kg)  01/25/20 218 lb (98.9 kg)     Review of Systems is noted in the HPI, as appropriate  Objective:   BP 140/72   Pulse 67   Temp 97.9 F (36.6 C) (Temporal)   Ht 5\' 11"   (1.803 m)   Wt 216 lb 8 oz (98.2 kg)   SpO2 97%   BMI 30.20 kg/m   GEN: No acute distress; alert,appropriate. PULM: Breathing comfortably in no respiratory distress PSYCH: Normally interactive.   Laboratory and Imaging Data:  Assessment and Plan:     ICD-10-CM   1. Type 2 diabetes, uncontrolled, with neuropathy (HCC)  E11.40 POCT glycosylated hemoglobin (Hb A1C)   E11.65      Lab Results  Component Value Date   HGBA1C 8.5 (A) 07/27/2020    Diabetes is doing better, he is having some morning lows that are causing some symptoms.  Going to alter his regimen somewhat have him check more in the afternoon  Patient Instructions  Check the blood sugar 2 hours after a meal in the afternoon.  Keep up the 40 units in the evening.   For now, increase to 42 units in the morning.   Watch the afternoon blood sugars, and if they are above 200, then add 2 units.    No orders of the defined types were placed in this encounter.  There are no discontinued medications. Orders Placed This Encounter  Procedures   POCT glycosylated hemoglobin (Hb A1C)    Follow-up: Return in about 3 months (around 10/27/2020) for Medicare wellness.  Signed,  [name]. Tiernan Suto, MD   Outpatient Encounter Medications as of 07/27/2020  Medication  Sig   aspirin 81 MG tablet Take 81 mg by mouth daily.   atorvastatin (LIPITOR) 20 MG tablet TAKE 1 TABLET BY MOUTH  DAILY   gabapentin (NEURONTIN) 100 MG capsule TAKE 1 CAPSULE BY MOUTH THREE TIMES A DAY   glucose blood test strip 1 each by Other route 2 (two) times daily. Use as instructed - One Touch Verio   insulin detemir (LEVEMIR FLEXTOUCH) 100 UNIT/ML FlexPen Inject 44 Units into the skin 2 (two) times daily.   Insulin Pen Needle (BD PEN NEEDLE NANO U/F) 32G X 4 MM MISC USE ONE DAILY ALONG WITH YOUR INSULIN PEN   Lancets 28G MISC by Does not apply route as directed.   lisinopril (ZESTRIL) 20 MG tablet TAKE 1 TABLET BY MOUTH  DAILY   metFORMIN  (GLUCOPHAGE-XR) 500 MG 24 hr tablet TAKE 4 TABLETS BY MOUTH  DAILY WITH BREAKFAST   Multiple Vitamin (MULTIVITAMIN WITH MINERALS) TABS tablet Take 1 tablet by mouth daily.   Multiple Vitamins-Minerals (VISION FORMULA PO) Take 1 tablet by mouth daily.   sildenafil (VIAGRA) 100 MG tablet Take 0.5-1 tablets (50-100 mg total) by mouth daily as needed for erectile dysfunction.   Facility-Administered Encounter Medications as of 07/27/2020  Medication   0.9 %  sodium chloride infusion

## 2020-07-27 ENCOUNTER — Encounter: Payer: Self-pay | Admitting: Family Medicine

## 2020-07-27 ENCOUNTER — Other Ambulatory Visit: Payer: Self-pay

## 2020-07-27 ENCOUNTER — Ambulatory Visit (INDEPENDENT_AMBULATORY_CARE_PROVIDER_SITE_OTHER): Payer: Medicare Other | Admitting: Family Medicine

## 2020-07-27 VITALS — BP 140/72 | HR 67 | Temp 97.9°F | Ht 71.0 in | Wt 216.5 lb

## 2020-07-27 DIAGNOSIS — E1140 Type 2 diabetes mellitus with diabetic neuropathy, unspecified: Secondary | ICD-10-CM

## 2020-07-27 DIAGNOSIS — IMO0002 Reserved for concepts with insufficient information to code with codable children: Secondary | ICD-10-CM

## 2020-07-27 DIAGNOSIS — E1165 Type 2 diabetes mellitus with hyperglycemia: Secondary | ICD-10-CM | POA: Diagnosis not present

## 2020-07-27 LAB — POCT GLYCOSYLATED HEMOGLOBIN (HGB A1C): Hemoglobin A1C: 8.5 % — AB (ref 4.0–5.6)

## 2020-07-27 NOTE — Patient Instructions (Signed)
Check the blood sugar 2 hours after a meal in the afternoon.  Keep up the 40 units in the evening.   For now, increase to 42 units in the morning.   Watch the afternoon blood sugars, and if they are above 200, then add 2 units.

## 2020-08-01 ENCOUNTER — Telehealth: Payer: Self-pay

## 2020-08-01 NOTE — Chronic Care Management (AMB) (Addendum)
Chronic Care Management Pharmacy Assistant   Name: Connor Moore  MRN: [id number] DOB: 1950/11/01  Reason for Encounter: Diabetes   Recent office visits:  07/27/20 - Dr.Copland, PCP - Pt presented for diabetes follow up. Increase insulin to 42 units in the morning. Continue 40 units in the evening. Check blood sugar 2 hours after afternoon meal. If glucose > 200, then add 2 units.  Recent consult visits:  None since last CCM contact   Hospital visits:  None in previous 6 months  Medications: Outpatient Encounter Medications as of 08/01/2020  Medication Sig   aspirin 81 MG tablet Take 81 mg by mouth daily.   atorvastatin (LIPITOR) 20 MG tablet TAKE 1 TABLET BY MOUTH  DAILY   gabapentin (NEURONTIN) 100 MG capsule TAKE 1 CAPSULE BY MOUTH THREE TIMES A DAY   glucose blood test strip 1 each by Other route 2 (two) times daily. Use as instructed - One Touch Verio   insulin detemir (LEVEMIR FLEXTOUCH) 100 UNIT/ML FlexPen Inject 44 Units into the skin 2 (two) times daily.   Insulin Pen Needle (BD PEN NEEDLE NANO U/F) 32G X 4 MM MISC USE ONE DAILY ALONG WITH YOUR INSULIN PEN   Lancets 28G MISC by Does not apply route as directed.   lisinopril (ZESTRIL) 20 MG tablet TAKE 1 TABLET BY MOUTH  DAILY   metFORMIN (GLUCOPHAGE-XR) 500 MG 24 hr tablet TAKE 4 TABLETS BY MOUTH  DAILY WITH BREAKFAST   Multiple Vitamin (MULTIVITAMIN WITH MINERALS) TABS tablet Take 1 tablet by mouth daily.   Multiple Vitamins-Minerals (VISION FORMULA PO) Take 1 tablet by mouth daily.   sildenafil (VIAGRA) 100 MG tablet Take 0.5-1 tablets (50-100 mg total) by mouth daily as needed for erectile dysfunction.   Facility-Administered Encounter Medications as of 08/01/2020  Medication   0.9 %  sodium chloride infusion      Recent Relevant Labs: Lab Results  Component Value Date/Time   HGBA1C 8.5 (A) 07/27/2020 08:17 AM   HGBA1C 9.1 (A) 04/25/2020 08:20 AM   HGBA1C 9.0 (H) 09/28/2019 09:01 AM   HGBA1C 11.2 (H)  09/24/2018 09:08 AM   MICROALBUR <0.7 09/28/2019 09:01 AM   MICROALBUR 0.9 09/24/2018 09:22 AM    Kidney Function Lab Results  Component Value Date/Time   CREATININE 0.73 09/28/2019 09:01 AM   CREATININE 0.74 09/24/2018 09:08 AM   GFR 107.01 09/28/2019 09:01 AM   GFRNONAA 123.54 04/04/2009 08:32 AM   GFRAA 100 03/29/2008 10:45 AM     Contacted patient on 08/04/2020 to discuss diabetes disease state.   Current antihyperglycemic regimen:   Metformin 2,000 mg daily Levemir 44 units BID (patient uses 42 units in morning and 40 units at bedtime)    Patient verbally confirms he is taking the above medications as directed. Yes  What diet changes have been made to improve diabetes control? none identified  What recent interventions/DTPs have been made to improve glycemic control: Insulin increased to 42 units in the morning.    Have there been any recent hospitalizations or ED visits since last visit with CPP? No  Patient denies hypoglycemic symptoms, including Pale, Sweaty, Shaky, Hungry, Nervous/irritable, and Vision changes  Patient denies hyperglycemic symptoms, including blurry vision, excessive thirst, fatigue, polyuria, and weakness  How often are you checking your blood sugar? in the morning before eating or drinking  What are your blood sugars ranging?  Fasting:  The patient only had 1 reading from last week --> 07/27/20  Fasting, 142  During the  week, how often does your blood glucose drop below 70? Never  Are you checking your feet daily/regularly? Yes  Adherence Review: Is the patient currently on a STATIN medication? Yes Is the patient currently on ACE/ARB medication? Yes Does the patient have >5 day gap between last estimated fill dates? No  Care Gaps: Last annual wellness visit: 09/2019 Last eye exam / retinopathy screening: 07/2019 Last diabetic foot exam: 09/2019  Counseled patient on importance of annual eye and foot exam.   Star Rating Drugs:   Medication:   Last Fill: Day Supply Metformin  XR 500mg   04/13/20 90 Lisinopril 20mg   05/04/20 90 Atorvastatin 20mg   04/29/20 90  The patient uses [facility] order.  PCP appointment on 10/2020  [name], CPP notified  Avel Sensor, [place] Assistant [phone]  I have reviewed the care management and care coordination activities outlined in this encounter and I am certifying that I agree with the content of this note. No further action required.  [name], PharmD Clinical Pharmacist [facility] at [facility] [phone]

## 2020-08-09 ENCOUNTER — Encounter: Payer: Self-pay | Admitting: Family Medicine

## 2020-08-09 DIAGNOSIS — T1501XD Foreign body in cornea, right eye, subsequent encounter: Secondary | ICD-10-CM | POA: Diagnosis not present

## 2020-08-09 DIAGNOSIS — E119 Type 2 diabetes mellitus without complications: Secondary | ICD-10-CM | POA: Diagnosis not present

## 2020-08-09 DIAGNOSIS — Z961 Presence of intraocular lens: Secondary | ICD-10-CM | POA: Diagnosis not present

## 2020-08-09 DIAGNOSIS — H35033 Hypertensive retinopathy, bilateral: Secondary | ICD-10-CM | POA: Diagnosis not present

## 2020-08-09 LAB — HM DIABETES EYE EXAM

## 2020-08-15 ENCOUNTER — Other Ambulatory Visit: Payer: Self-pay | Admitting: Family Medicine

## 2020-08-16 NOTE — Telephone Encounter (Signed)
Last office visit 07/27/2020 for DM.  Last refilled 03/24/2020 for #[id number] with 1 refill.  Next Appt: 10/31/2020 for [place].

## 2020-09-08 ENCOUNTER — Telehealth: Payer: Self-pay

## 2020-09-08 NOTE — Chronic Care Management (AMB) (Addendum)
    Chronic Care Management Pharmacy Assistant   Name: Connor Moore  MRN: [id number] DOB: 1950/12/11  Reason for Encounter: General Adherence  Recent office visits:  None since last CCM   Recent consult visits:  None since last [facility] visits:  None in previous 6 months  Medications: Outpatient Encounter Medications as of 09/08/2020  Medication Sig   gabapentin (NEURONTIN) 100 MG capsule TAKE 1 CAPSULE BY MOUTH 3  TIMES DAILY   aspirin 81 MG tablet Take 81 mg by mouth daily.   atorvastatin (LIPITOR) 20 MG tablet TAKE 1 TABLET BY MOUTH  DAILY   glucose blood test strip 1 each by Other route 2 (two) times daily. Use as instructed - One Touch Verio   insulin detemir (LEVEMIR FLEXTOUCH) 100 UNIT/ML FlexPen Inject 44 Units into the skin 2 (two) times daily.   Insulin Pen Needle (BD PEN NEEDLE NANO U/F) 32G X 4 MM MISC USE ONE DAILY ALONG WITH YOUR INSULIN PEN   Lancets 28G MISC by Does not apply route as directed.   lisinopril (ZESTRIL) 20 MG tablet TAKE 1 TABLET BY MOUTH  DAILY   metFORMIN (GLUCOPHAGE-XR) 500 MG 24 hr tablet TAKE 4 TABLETS BY MOUTH  DAILY WITH BREAKFAST   Multiple Vitamin (MULTIVITAMIN WITH MINERALS) TABS tablet Take 1 tablet by mouth daily.   Multiple Vitamins-Minerals (VISION FORMULA PO) Take 1 tablet by mouth daily.   sildenafil (VIAGRA) 100 MG tablet Take 0.5-1 tablets (50-100 mg total) by mouth daily as needed for erectile dysfunction.   Facility-Administered Encounter Medications as of 09/08/2020  Medication   0.9 %  sodium chloride infusion    Attempted contact with [name] 3 times on 09/08/20,09/09/20,09/13/20. Unsuccessful outreach. Will attempt contact next month.    Patient is > 5 days past due for refill on the following medications per chart history:  Unable to verify fill dates.The patient does use [facility].  Star Medications: Medication Name/mg Last Fill Days Supply Metformin  XR '500mg'$              04/13/20             90 Lisinopril '20mg'$                         05/04/20            90 Atorvastatin '20mg'$                    04/29/20            90 Levemir   05/04/20  108  PCP appointment on 10/31/20 and CCM appointment on 09/20/20  [name], CPP notified  Avel Sensor, [place] Assistant [phone]  I have reviewed the care management and care coordination activities outlined in this encounter and I am certifying that I agree with the content of this note. No further action required.  [name], PharmD Clinical Pharmacist [place] at [facility] [phone]

## 2020-09-15 ENCOUNTER — Telehealth: Payer: Self-pay

## 2020-09-15 NOTE — Chronic Care Management (AMB) (Addendum)
    Chronic Care Management Pharmacy Assistant   Name: Connor Moore  MRN: [id number] DOB: 03-12-1950   Reason for Encounter: CCM Reminder Call   Conditions to be addressed/monitored: HTN, HLD, and DMII   Medications: Outpatient Encounter Medications as of 09/15/2020  Medication Sig   gabapentin (NEURONTIN) 100 MG capsule TAKE 1 CAPSULE BY MOUTH 3  TIMES DAILY   aspirin 81 MG tablet Take 81 mg by mouth daily.   atorvastatin (LIPITOR) 20 MG tablet TAKE 1 TABLET BY MOUTH  DAILY   glucose blood test strip 1 each by Other route 2 (two) times daily. Use as instructed - One Touch Verio   insulin detemir (LEVEMIR FLEXTOUCH) 100 UNIT/ML FlexPen Inject 44 Units into the skin 2 (two) times daily.   Insulin Pen Needle (BD PEN NEEDLE NANO U/F) 32G X 4 MM MISC USE ONE DAILY ALONG WITH YOUR INSULIN PEN   Lancets 28G MISC by Does not apply route as directed.   lisinopril (ZESTRIL) 20 MG tablet TAKE 1 TABLET BY MOUTH  DAILY   metFORMIN (GLUCOPHAGE-XR) 500 MG 24 hr tablet TAKE 4 TABLETS BY MOUTH  DAILY WITH BREAKFAST   Multiple Vitamin (MULTIVITAMIN WITH MINERALS) TABS tablet Take 1 tablet by mouth daily.   Multiple Vitamins-Minerals (VISION FORMULA PO) Take 1 tablet by mouth daily.   sildenafil (VIAGRA) 100 MG tablet Take 0.5-1 tablets (50-100 mg total) by mouth daily as needed for erectile dysfunction.   Facility-Administered Encounter Medications as of 09/15/2020  Medication   0.9 %  sodium chloride infusion   [name] did not answer the telephone to remind him of his upcoming telephone visit with [name] on 09/20/20 at 9:30am. Patient was reminded to have all medications, supplements and any blood glucose and blood pressure readings available for review at appointment. Left voicemail.    Star Rating Drugs: Medication:  Last Fill: Day Supply Metformin  XR '500mg'$              04/13/20            90 Lisinopril '20mg'$                         05/04/20            90 Atorvastatin '20mg'$                     04/29/20            90    Patient is using [facility] order pharmacy.  [name], CPP notified  Avel Sensor, [place] Assistant [phone]  I have reviewed the care management and care coordination activities outlined in this encounter and I am certifying that I agree with the content of this note. No further action required.  [name], PharmD Clinical Pharmacist [facility] at [facility] [phone]

## 2020-09-17 ENCOUNTER — Other Ambulatory Visit: Payer: Self-pay | Admitting: Family Medicine

## 2020-09-20 ENCOUNTER — Telehealth: Payer: Medicare Other

## 2020-09-21 ENCOUNTER — Other Ambulatory Visit: Payer: Self-pay | Admitting: Family Medicine

## 2020-10-31 ENCOUNTER — Ambulatory Visit: Payer: Medicare Other | Admitting: Family Medicine

## 2020-11-07 ENCOUNTER — Ambulatory Visit (INDEPENDENT_AMBULATORY_CARE_PROVIDER_SITE_OTHER): Payer: Medicare Other | Admitting: Family Medicine

## 2020-11-07 ENCOUNTER — Other Ambulatory Visit: Payer: Self-pay

## 2020-11-07 ENCOUNTER — Encounter: Payer: Self-pay | Admitting: Family Medicine

## 2020-11-07 VITALS — BP 144/60 | HR 79 | Temp 97.6°F | Ht 71.0 in | Wt 219.5 lb

## 2020-11-07 DIAGNOSIS — R600 Localized edema: Secondary | ICD-10-CM

## 2020-11-07 DIAGNOSIS — R0609 Other forms of dyspnea: Secondary | ICD-10-CM

## 2020-11-07 DIAGNOSIS — Z79899 Other long term (current) drug therapy: Secondary | ICD-10-CM

## 2020-11-07 DIAGNOSIS — Z1211 Encounter for screening for malignant neoplasm of colon: Secondary | ICD-10-CM

## 2020-11-07 DIAGNOSIS — Z0000 Encounter for general adult medical examination without abnormal findings: Secondary | ICD-10-CM

## 2020-11-07 DIAGNOSIS — Z794 Long term (current) use of insulin: Secondary | ICD-10-CM | POA: Diagnosis not present

## 2020-11-07 DIAGNOSIS — Z125 Encounter for screening for malignant neoplasm of prostate: Secondary | ICD-10-CM

## 2020-11-07 DIAGNOSIS — E782 Mixed hyperlipidemia: Secondary | ICD-10-CM | POA: Diagnosis not present

## 2020-11-07 DIAGNOSIS — E1142 Type 2 diabetes mellitus with diabetic polyneuropathy: Secondary | ICD-10-CM

## 2020-11-07 LAB — CBC WITH DIFFERENTIAL/PLATELET
Basophils Absolute: 0 10*3/uL (ref 0.0–0.1)
Basophils Relative: 0.4 % (ref 0.0–3.0)
Eosinophils Absolute: 0.1 10*3/uL (ref 0.0–0.7)
Eosinophils Relative: 1.3 % (ref 0.0–5.0)
HCT: 48.4 % (ref 39.0–52.0)
Hemoglobin: 15.9 g/dL (ref 13.0–17.0)
Lymphocytes Relative: 28.3 % (ref 12.0–46.0)
Lymphs Abs: 3 10*3/uL (ref 0.7–4.0)
MCHC: 32.7 g/dL (ref 30.0–36.0)
MCV: 99.9 fl (ref 78.0–100.0)
Monocytes Absolute: 1.1 10*3/uL — ABNORMAL HIGH (ref 0.1–1.0)
Monocytes Relative: 10.8 % (ref 3.0–12.0)
Neutro Abs: 6.2 10*3/uL (ref 1.4–7.7)
Neutrophils Relative %: 59.2 % (ref 43.0–77.0)
Platelets: 221 10*3/uL (ref 150.0–400.0)
RBC: 4.84 Mil/uL (ref 4.22–5.81)
RDW: 13.6 % (ref 11.5–15.5)
WBC: 10.4 10*3/uL (ref 4.0–10.5)

## 2020-11-07 LAB — BRAIN NATRIURETIC PEPTIDE: Pro B Natriuretic peptide (BNP): 108 pg/mL — ABNORMAL HIGH (ref 0.0–100.0)

## 2020-11-07 LAB — HEPATIC FUNCTION PANEL
ALT: 15 U/L (ref 0–53)
AST: 15 U/L (ref 0–37)
Albumin: 4.4 g/dL (ref 3.5–5.2)
Alkaline Phosphatase: 70 U/L (ref 39–117)
Bilirubin, Direct: 0.3 mg/dL (ref 0.0–0.3)
Total Bilirubin: 1.5 mg/dL — ABNORMAL HIGH (ref 0.2–1.2)
Total Protein: 6.6 g/dL (ref 6.0–8.3)

## 2020-11-07 LAB — LIPID PANEL
Cholesterol: 88 mg/dL (ref 0–200)
HDL: 33.5 mg/dL — ABNORMAL LOW (ref >39.00)
LDL Cholesterol: 39 mg/dL (ref 0–99)
NonHDL: 54.06
Total CHOL/HDL Ratio: 3
Triglycerides: 76 mg/dL (ref 0.0–149.0)
VLDL: 15.2 mg/dL (ref 0.0–40.0)

## 2020-11-07 LAB — BASIC METABOLIC PANEL
BUN: 21 mg/dL (ref 6–23)
CO2: 27 mEq/L (ref 19–32)
Calcium: 10 mg/dL (ref 8.4–10.5)
Chloride: 109 mEq/L (ref 96–112)
Creatinine, Ser: 0.86 mg/dL (ref 0.40–1.50)
GFR: 88.91 mL/min (ref >60.00)
Glucose, Bld: 70 mg/dL (ref 70–99)
Potassium: 5.5 mEq/L — ABNORMAL HIGH (ref 3.5–5.1)
Sodium: 144 mEq/L (ref 135–145)

## 2020-11-07 LAB — HEMOGLOBIN A1C: Hgb A1c MFr Bld: 8.5 % — ABNORMAL HIGH (ref 4.6–6.5)

## 2020-11-07 LAB — PSA, MEDICARE: PSA: 1.73 ng/ml (ref 0.10–4.00)

## 2020-11-07 MED ORDER — LISINOPRIL-HYDROCHLOROTHIAZIDE 20-12.5 MG PO TABS: 1.0000 | ORAL_TABLET | Freq: Every day | ORAL | 3 refills | Status: DC

## 2020-11-07 NOTE — Progress Notes (Signed)
Willy Pinkerton T. Densel Kronick, MD, [place] at [facility] [place], [address]  Phone: [phone]  FAX: [phone]  Connor Moore - 70 y.o. male  MRN [id number]  Date of Birth: 12-Sep-1950  Date: 11/07/2020  PCP: [name], MD  Referral: [name], MD  Chief Complaint  Patient presents with  . Medicare Wellness    This visit occurred during the SARS-CoV-2 public health emergency.  Safety protocols were in place, including screening questions prior to the visit, additional usage of staff PPE, and extensive cleaning of exam room while observing appropriate contact time as indicated for disinfecting solutions.   Patient Care Team: [name], MD as PCP - General Buford Dresser, MD as PCP - Cardiology (Cardiology) [name], [facility] as Pharmacist (Pharmacist) Subjective:   Connor Moore is a 70 y.o. pleasant patient who presents for a medicare wellness examination:  Preventative Health Maintenance Visit:  Health Maintenance Summary Reviewed and updated, unless pt declines services.  Tobacco History Reviewed. Alcohol: No concerns, no excessive use Exercise Habits: He is walking for about 40 to 60 minutes approximately 4 times a week  STD concerns: no risk or activity to increase risk Drug Use: None  -He does tell me that he has self check blood sugars have gotten quite a bit better in the last few months. Diabetes Mellitus: Tolerating Medications: yes Compliance with diet: fair, Body mass index is 30.61 kg/m. Exercise: minimal / intermittent Avg blood sugars at home: 120-160 Foot problems: none Hypoglycemia: none No nausea, vomitting, blurred vision, polyuria.  Lab Results  Component Value Date   HGBA1C 8.5 (A) 07/27/2020   HGBA1C 9.1 (A) 04/25/2020   HGBA1C 8.9 (A) 01/25/2020   Lab Results  Component Value Date   MICROALBUR <0.7 09/28/2019   LDLCALC 67 09/28/2019    CREATININE 0.73 09/28/2019    Wt Readings from Last 3 Encounters:  11/07/20 219 lb 8 oz (99.6 kg)  07/27/20 216 lb 8 oz (98.2 kg)  04/25/20 224 lb 12 oz (101.9 kg)    Colon - follow-up needed  Covid bi-valen Pneumovax Shingrix Flu  B LE edema - check BNP This is new for him in the last year, right is slightly worse than the left  Immunization History  Administered Date(s) Administered  . PFIZER Comirnaty(Gray Top)Covid-19 Tri-Sucrose Vaccine 08/11/2020  . PFIZER(Purple Top)SARS-COV-2 Vaccination 02/21/2019, 03/14/2019, 09/14/2019  . Tdap 03/31/2015     Health Maintenance  Topic Date Due  . COLONOSCOPY (Pts 45-30yrs Insurance coverage will need to be confirmed)  06/19/2019  . COVID-19 Vaccine (5 - Booster for [employer] series) 10/06/2020  . Zoster Vaccines- Shingrix (1 of 2) 02/07/2021 (Originally 03/31/1971)  . INFLUENZA VACCINE  04/14/2021 (Originally 08/15/2020)  . Pneumonia Vaccine 21+ Years old (1 - PCV) 11/07/2021 (Originally 03/31/1958)  . HEMOGLOBIN A1C  01/27/2021  . OPHTHALMOLOGY EXAM  08/09/2021  . FOOT EXAM  11/07/2021  . TETANUS/TDAP  03/30/2025  . Hepatitis C Screening  Completed  . HPV VACCINES  Aged Out    Immunization History  Administered Date(s) Administered  . PFIZER Comirnaty(Gray Top)Covid-19 Tri-Sucrose Vaccine 08/11/2020  . PFIZER(Purple Top)SARS-COV-2 Vaccination 02/21/2019, 03/14/2019, 09/14/2019  . Tdap 03/31/2015    Patient Active Problem List   Diagnosis Date Noted  . Controlled type 2 diabetes mellitus with diabetic polyneuropathy, with long-term current use of insulin ([place]) 04/01/2008    Priority: 1.  . Diabetic polyneuropathy associated with type 2 diabetes mellitus ([place]) 12/07/2010  Priority: 2.  . Hyperlipidemia 02/12/2008    Priority: 2.  . Erectile dysfunction associated with type 2 diabetes mellitus ([place]) 02/12/2008    Priority: 2.  . Essential hypertension 02/12/2008    Priority: 2.  . 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 Diverticulosis   . Colon polyps   . TOBACCO USE 05/19/2009    Past Medical History:  Diagnosis Date  . Colon polyps   . Diabetes mellitus type II   . Diabetic neuropathy ([place]) 12/07/2010  . Diverticulosis   . ED (erectile dysfunction)    rare  . HLD (hyperlipidemia)   . HTN (hypertension)   . Obesity     Past Surgical History:  Procedure Laterality Date  . APPENDECTOMY    . INGUINAL HERNIA REPAIR  1974  . KNEE ARTHROSCOPY  1986   Right  . Left Hydrocele surgery  1974  . REPLACEMENT UNICONDYLAR JOINT KNEE  2009  . TESTICLE REMOVAL  1974   Left  . TONSILLECTOMY    . VASECTOMY  1981    Family History  Problem Relation Age of Onset  . Diabetes Father   . Cancer Father        mouth  . Mental illness Mother        ?   . Diabetes Sister   . Colon cancer Neg Hx   . Esophageal cancer Neg Hx   . Rectal cancer Neg Hx   . Stomach cancer Neg Hx     Past Medical History, Surgical History, Social History, Family History, Problem List, Medications, and Allergies have been reviewed and updated if relevant.  Review of Systems: Pertinent positives are listed above.  Otherwise, a full 14 point review of systems has been done in full and it is negative except where it is noted positive.  Objective:   BP (!) 144/60   Pulse 79   Temp 97.6 F (36.4 C) (Temporal)   Ht 5\' 11"  (1.803 m)   Wt 219 lb 8 oz (99.6 kg)   SpO2 98%   BMI 30.61 kg/m  Fall Risk  11/07/2020 09/28/2019 09/24/2018 04/01/2017  Falls in the past year? 0 0 0 No   Ideal Body Weight: Weight in (lb) to have BMI = 25: 178.9 Hearing Screening - Comments:: Wears Bilateral Hearing Aides Vision Screening - Comments:: Eye Exam with Dr. Katy Fitch 04/2020 Depression screen [facility] 2/9 11/07/2020 09/28/2019 09/24/2018 04/01/2017 09/03/2016  Decreased Interest 0 0 0 0 0  Down, Depressed, Hopeless 0 0 0 0 0  PHQ - 2 Score 0 0 0 0 0     GEN: well developed, well nourished, no acute distress Eyes:  conjunctiva and lids normal, PERRLA, EOMI ENT: TM clear, nares clear, oral exam WNL Neck: supple, no lymphadenopathy, no thyromegaly, no JVD Pulm: clear to auscultation and percussion, respiratory effort normal CV: regular rate and rhythm, S1-S2, no murmur, rub or gallop, no bruits, peripheral pulses normal and symmetric,   He does have some trace lower extremity edema GI: soft, non-tender; no hepatosplenomegaly, masses; active bowel sounds all quadrants GU: deferred Lymph: no cervical, axillary or inguinal adenopathy MSK: gait normal, muscle tone and strength WNL, no joint swelling, effusions, discoloration, crepitus  SKIN: clear, good turgor, color WNL, no rashes, lesions, or ulcerations Neuro: normal mental status, normal strength, sensation, and motion Psych: alert; oriented to person, place and time, normally interactive and not anxious or depressed in appearance.  All labs reviewed with patient.  Results for  orders placed or performed in visit on 08/09/20  HM DIABETES EYE EXAM  Result Value Ref Range   HM Diabetic Eye Exam No Retinopathy No Retinopathy    Assessment and Plan:     ICD-10-CM   1. Healthcare maintenance  Z00.00     2. Screening for malignant neoplasm of colon  Z12.11 Ambulatory referral to Gastroenterology    3. Controlled type 2 diabetes mellitus with diabetic polyneuropathy, with long-term current use of insulin (HCC)  E11.42 Hemoglobin A1c   Y65.0 Basic metabolic panel    4. Mixed hyperlipidemia  E78.2 Lipid panel    5. Long-term use of high-risk medication  Z79.899 CBC with Differential/Platelet    Hepatic function panel    6. Screening for malignant neoplasm of prostate  Z12.5 PSA, Medicare ([place])    7. DOE (dyspnea on exertion)  R06.09 Brain natriuretic peptide    8. Lower extremity edema  R60.0 Brain natriuretic peptide     He is generally doing okay. I am going to have to check labs to see where his diabetes is.  He does decline all labs.  The patient declines routine health maintenance services noted.  Foregoing vaccination increases risk of death.  The patient indicated that they understood this and was willing to accept those risks.   Check all basic labs, also add a cardiac BNP.  Health Maintenance Exam: The patient's preventative maintenance and recommended screening tests for an annual wellness exam were reviewed in full today. Brought up to date unless services declined.  Counselled on the importance of diet, exercise, and its role in overall health and mortality. The patient's FH and SH was reviewed, including their home life, tobacco status, and drug and alcohol status.  Follow-up in 1 year for physical exam or additional follow-up below.  I have personally reviewed the Medicare Annual Wellness questionnaire and have noted 1. The patient's medical and social history 2. Their use of alcohol, tobacco or illicit drugs 3. Their current medications and supplements 4. The patient's functional ability including ADL's, fall risks, home safety risks and hearing or visual             impairment. 5. Diet and physical activities 6. Evidence for depression or mood disorders 7. Reviewed Updated provider list, see scanned forms and [facility] Snapshot.  8. Reviewed whether or not the patient has HCPOA or living will, and discussed what this means with the patient.  Recommended he bring in a copy for his chart in [facility].  The patients weight, height, BMI and visual acuity have been recorded in the chart I have made referrals, counseling and provided education to the patient based review of the above and I have provided the pt with a written personalized care plan for preventive services.  I have provided the patient with a copy of your personalized plan for preventive services. Instructed to take the time to review along with their updated medication list.  Follow-up: Return in about 6 months (around 05/08/2021) for diabetes. Or follow-up  in 1 year if not noted.  Future Appointments  Date Time Provider [place]  05/08/2021  8:00 AM Raveena Hebdon, Frederico Hamman, MD [facility]    Meds ordered this encounter  Medications  . lisinopril-hydrochlorothiazide (ZESTORETIC) 20-12.5 MG tablet    Sig: Take 1 tablet by mouth daily.    Dispense:  90 tablet    Refill:  3   Medications Discontinued During This Encounter  Medication Reason  . lisinopril (ZESTRIL) 20 MG tablet  Orders Placed This Encounter  Procedures  . Hemoglobin A1c  . Basic metabolic panel  . CBC with Differential/Platelet  . Hepatic function panel  . Lipid panel  . PSA, Medicare ([place])  . Brain natriuretic peptide  . Ambulatory referral to Gastroenterology    Signed,  Frederico Hamman T. Annalucia Laino, MD   Allergies as of 11/07/2020   No Known Allergies      Medication List        Accurate as of November 07, 2020  9:18 AM. If you have any questions, ask your nurse or doctor.          STOP taking these medications    lisinopril 20 MG tablet Commonly known as: ZESTRIL Stopped by: [name], MD       TAKE these medications    aspirin 81 MG tablet Take 81 mg by mouth daily.   atorvastatin 20 MG tablet Commonly known as: LIPITOR TAKE 1 TABLET BY MOUTH  DAILY   BD Pen Needle Nano U/F 32G X 4 MM Misc Generic drug: Insulin Pen Needle USE ONE DAILY ALONG WITH YOUR INSULIN PEN   gabapentin 100 MG capsule Commonly known as: NEURONTIN TAKE 1 CAPSULE BY MOUTH 3  TIMES DAILY   glucose blood test strip 1 each by Other route 2 (two) times daily. Use as instructed - One Touch Verio   Lancets 28G Misc by Does not apply route as directed.   Levemir FlexTouch 100 UNIT/ML FlexPen Generic drug: insulin detemir Inject 44 Units into the skin 2 (two) times daily.   lisinopril-hydrochlorothiazide 20-12.5 MG tablet Commonly known as: Zestoretic Take 1 tablet by mouth daily. Started by: [name], MD   metFORMIN 500 MG 24 hr  tablet Commonly known as: GLUCOPHAGE-XR TAKE 4 TABLETS BY MOUTH  DAILY WITH BREAKFAST   multivitamin with minerals Tabs tablet Take 1 tablet by mouth daily.   sildenafil 100 MG tablet Commonly known as: Viagra Take 0.5-1 tablets (50-100 mg total) by mouth daily as needed for erectile dysfunction.   VISION FORMULA PO Take 1 tablet by mouth daily.

## 2020-11-15 ENCOUNTER — Telehealth: Payer: Self-pay

## 2020-11-15 NOTE — Addendum Note (Signed)
Addended by: Carter Kitten on: 11/15/2020 02:19 PM   Modules accepted: Orders

## 2020-11-15 NOTE — Progress Notes (Addendum)
Chronic Care Management Pharmacy Assistant   Name: Connor Moore  MRN: [id number] DOB: 05-Jan-1951  Reason for Encounter: CCM (Diabetes Disease State)   Recent office visits:  11/07/2020 - [name], MD - Patient presented for [facility] Wellness Visit. Labs: BMP, Brain Natriuretic Peptide, CBC, A1c, Hepatic Function Panel, Lipid and PSA, Medicare. Referral to Gastroenterology.   Recent consult visits:  None since last CCM contact  Hospital visits:  None in previous 6 months  Medications: Outpatient Encounter Medications as of 11/15/2020  Medication Sig   aspirin 81 MG tablet Take 81 mg by mouth daily.   atorvastatin (LIPITOR) 20 MG tablet TAKE 1 TABLET BY MOUTH  DAILY   gabapentin (NEURONTIN) 100 MG capsule TAKE 1 CAPSULE BY MOUTH 3  TIMES DAILY   glucose blood test strip 1 each by Other route 2 (two) times daily. Use as instructed - One Touch Verio   insulin detemir (LEVEMIR FLEXTOUCH) 100 UNIT/ML FlexPen Inject 44 Units into the skin 2 (two) times daily.   Insulin Pen Needle (BD PEN NEEDLE NANO U/F) 32G X 4 MM MISC USE ONE DAILY ALONG WITH YOUR INSULIN PEN   Lancets 28G MISC by Does not apply route as directed.   lisinopril-hydrochlorothiazide (ZESTORETIC) 20-12.5 MG tablet Take 1 tablet by mouth daily.   metFORMIN (GLUCOPHAGE-XR) 500 MG 24 hr tablet TAKE 4 TABLETS BY MOUTH  DAILY WITH BREAKFAST   Multiple Vitamin (MULTIVITAMIN WITH MINERALS) TABS tablet Take 1 tablet by mouth daily.   Multiple Vitamins-Minerals (VISION FORMULA PO) Take 1 tablet by mouth daily.   sildenafil (VIAGRA) 100 MG tablet Take 0.5-1 tablets (50-100 mg total) by mouth daily as needed for erectile dysfunction.   Facility-Administered Encounter Medications as of 11/15/2020  Medication   0.9 %  sodium chloride infusion   Recent Relevant Labs: Lab Results  Component Value Date/Time   HGBA1C 8.5 (H) 11/07/2020 09:02 AM   HGBA1C 8.5 (A) 07/27/2020 08:17 AM   HGBA1C 9.1 (A) 04/25/2020 08:20 AM    HGBA1C 9.0 (H) 09/28/2019 09:01 AM   MICROALBUR <0.7 09/28/2019 09:01 AM   MICROALBUR 0.9 09/24/2018 09:22 AM    Kidney Function Lab Results  Component Value Date/Time   CREATININE 0.86 11/07/2020 09:02 AM   CREATININE 0.73 09/28/2019 09:01 AM   GFR 88.91 11/07/2020 09:02 AM   GFRNONAA 123.54 04/04/2009 08:32 AM   GFRAA 100 03/29/2008 10:45 AM   Attempted contact with patient 3 times on 11/01, 11/09 and 11/10. Unsuccessful outreach. Will attempt contact next month.    Current antihyperglycemic regimen:  Metformin 2,000 mg daily Levemir 44 units BID (patient uses 42 units in morning - 40 units at bedtime)  Adherence Review: Is the patient currently on a STATIN medication? Yes Is the patient currently on ACE/ARB medication? Yes Does the patient have >5 day gap between last estimated fill dates? No  Care Gaps: Annual wellness visit in last year? Yes Most recent A1C reading: 8.5 on 11/07/2020 Most Recent BP reading: 144/60 on 11/07/2020  Last eye exam / retinopathy screening: Up to date Last diabetic foot exam: Up to date  Counseled patient on importance of annual eye and foot exam.   Star Rating Drugs:  Medication:   Last Fill: Day Supply Metformin  XR 500mg   09/30/2020 90 Lisinopril 20mg   10/18/2020 90 Atorvastatin 20mg   10/13/2020 90 Fill dates verified with [facility]  No appointments scheduled within the next 30 days.  [name], CPP notified  Marijean Niemann, [place] Clinical Pharmacy Assistant  [phone]  I have reviewed the care management and care coordination activities outlined in this encounter and I am certifying that I agree with the content of this note. No further action required.  [name], PharmD Clinical Pharmacist [facility] at [facility] [phone]

## 2020-11-16 NOTE — Addendum Note (Signed)
Addended by: [name] on: 11/16/2020 07:47 AM   Modules accepted: Orders

## 2020-11-16 NOTE — Progress Notes (Signed)
Ok, done 

## 2020-11-24 ENCOUNTER — Other Ambulatory Visit: Payer: Self-pay | Admitting: Family Medicine

## 2020-12-07 ENCOUNTER — Other Ambulatory Visit: Payer: Self-pay | Admitting: Family Medicine

## 2020-12-30 ENCOUNTER — Telehealth: Payer: Self-pay

## 2020-12-30 NOTE — Progress Notes (Signed)
Chronic Care Management Pharmacy Assistant   Name: Connor Moore  MRN: [id number] DOB: 14-Aug-1950  Reason for Encounter: CCM (Diabetes Disease State)   Recent office visits:  None since last CCM contact  Recent consult visits:  None since last CCM contact  Hospital visits:  None in previous 6 months  Medications: Outpatient Encounter Medications as of 12/30/2020  Medication Sig   aspirin 81 MG tablet Take 81 mg by mouth daily.   atorvastatin (LIPITOR) 20 MG tablet TAKE 1 TABLET BY MOUTH  DAILY   gabapentin (NEURONTIN) 100 MG capsule TAKE 1 CAPSULE BY MOUTH 3  TIMES DAILY   glucose blood test strip 1 each by Other route 2 (two) times daily. Use as instructed - One Touch Verio   insulin detemir (LEVEMIR FLEXTOUCH) 100 UNIT/ML FlexPen Inject 46 Units into the skin 2 (two) times daily.   Insulin Pen Needle (BD PEN NEEDLE NANO U/F) 32G X 4 MM MISC USE ONE DAILY ALONG WITH YOUR INSULIN PEN   Lancets 28G MISC by Does not apply route as directed.   lisinopril-hydrochlorothiazide (ZESTORETIC) 20-12.5 MG tablet Take 1 tablet by mouth daily.   metFORMIN (GLUCOPHAGE-XR) 500 MG 24 hr tablet TAKE 4 TABLETS BY MOUTH  DAILY WITH BREAKFAST   Multiple Vitamin (MULTIVITAMIN WITH MINERALS) TABS tablet Take 1 tablet by mouth daily.   Multiple Vitamins-Minerals (VISION FORMULA PO) Take 1 tablet by mouth daily.   sildenafil (VIAGRA) 100 MG tablet Take 0.5-1 tablets (50-100 mg total) by mouth daily as needed for erectile dysfunction.   Facility-Administered Encounter Medications as of 12/30/2020  Medication   0.9 %  sodium chloride infusion   Recent Relevant Labs: Lab Results  Component Value Date/Time   HGBA1C 8.5 (H) 11/07/2020 09:02 AM   HGBA1C 8.5 (A) 07/27/2020 08:17 AM   HGBA1C 9.1 (A) 04/25/2020 08:20 AM   HGBA1C 9.0 (H) 09/28/2019 09:01 AM   MICROALBUR <0.7 09/28/2019 09:01 AM   MICROALBUR 0.9 09/24/2018 09:22 AM    Kidney Function Lab Results  Component Value Date/Time    CREATININE 0.86 11/07/2020 09:02 AM   CREATININE 0.73 09/28/2019 09:01 AM   GFR 88.91 11/07/2020 09:02 AM   GFRNONAA 123.54 04/04/2009 08:32 AM   GFRAA 100 03/29/2008 10:45 AM   Attempted contact with patient 3 times on 12/19, 12/20 and 12/21. Unsuccessful outreach. Will attempt contact next month.    Current antihyperglycemic regimen:  Metformin 2,000 mg daily Levemir 44 units BID (patient uses 42 units in morning - 40 units at bedtime)    Adherence Review: Is the patient currently on a STATIN medication? Yes Is the patient currently on ACE/ARB medication? Yes Does the patient have >5 day gap between last estimated fill dates? No   Care Gaps: Annual wellness visit in last year? Yes Most recent A1C reading: 8.5 on 11/07/2020 Most Recent BP reading: 144/60 on 11/07/2020   Last eye exam / retinopathy screening: Up to date Last diabetic foot exam: Up to date    Star Rating Drugs:  Medication:                           Last Fill:         Day Supply Metformin  XR 500mg   12/21/2020      90 Lisinopril 20mg   10/18/2020      90 Atorvastatin 20mg   10/13/2020      90 Fill dates verified with [facility]  No appointments scheduled within  the next 30 days.  [name], CPP notified  Connor Moore, [place] Clinical Pharmacy Assistant [phone]

## 2021-01-10 ENCOUNTER — Telehealth: Payer: Self-pay

## 2021-01-10 NOTE — Telephone Encounter (Signed)
Left message for patient to call back. Received message from [facility] stating they are not accepting new patients referral at this time. Need to know if patient wants to go to [place] office or has preference on another office. There is Dr Ronnald Collum in [place] but he is not part of [facility] or [facility] system

## 2021-01-11 NOTE — Telephone Encounter (Signed)
Left detailed message for Mrs. Reth about the endocrinology referral.  I ask that she call us back with their preference.

## 2021-01-11 NOTE — Telephone Encounter (Signed)
Referral sent to [facility] in [place] per patient request  They are getting overwhelmed with referrals. They will reach out to the patient as soon as they are able or the patient may reach out tot hem to schedule.

## 2021-01-11 NOTE — Telephone Encounter (Signed)
Spoke with Mrs. Khatoon. She states [place] location would be fine for an endocrinologist.  Juluis Rainier to [employer].

## 2021-01-12 NOTE — Telephone Encounter (Addendum)
Connor Moore notified as instructed.  Phone number provided for [facility] [phone] so they can call and schedule appointment.

## 2021-03-13 ENCOUNTER — Other Ambulatory Visit: Payer: Self-pay

## 2021-03-13 ENCOUNTER — Ambulatory Visit: Payer: Medicare Other | Admitting: Endocrinology

## 2021-03-13 VITALS — BP 162/90 | HR 70 | Ht 71.0 in | Wt 220.4 lb

## 2021-03-13 DIAGNOSIS — E1142 Type 2 diabetes mellitus with diabetic polyneuropathy: Secondary | ICD-10-CM

## 2021-03-13 DIAGNOSIS — Z794 Long term (current) use of insulin: Secondary | ICD-10-CM | POA: Diagnosis not present

## 2021-03-13 LAB — POCT GLYCOSYLATED HEMOGLOBIN (HGB A1C): Hemoglobin A1C: 8.2 % — AB (ref 4.0–5.6)

## 2021-03-13 MED ORDER — METFORMIN HCL ER 500 MG PO TB24: 1000.0000 mg | ORAL_TABLET | Freq: Every day | ORAL | 3 refills | Status: DC

## 2021-03-13 NOTE — Patient Instructions (Addendum)
Your blood pressure is high today.  Please see your primary care provider soon, to have it rechecked good diet and exercise significantly improve the control of your diabetes.  please let me know if you wish to be referred to a dietician.  high blood sugar is very risky to your health.  you should see an eye doctor and dentist every year.  It is very important to get all recommended vaccinations.  Controlling your blood pressure and cholesterol drastically reduces the damage diabetes does to your body.  Those who smoke should quit.  Please discuss these with your doctor.  check your blood sugar twice a day.  vary the time of day when you check, between before the 3 meals, and at bedtime.  also check if you have symptoms of your blood sugar being too high or too low.  please keep a record of the readings and bring it to your next appointment here (or you can bring the meter itself).  You can write it on any piece of paper.  please call us sooner if your blood sugar goes below 70, or if most of your readings are over 200.   We will need to take this complex situation in stages.   I have sent a prescription to your pharmacy, to half the metformin.     Please come back for a follow-up appointment in 1 month

## 2021-03-13 NOTE — Progress Notes (Signed)
Subjective:    Patient ID: Connor Moore, male    DOB: 18-Sep-1950, 71 y.o.   MRN: [id number]  HPI pt is referred by Dr Lorelei Pont, for diabetes.  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  Pt says cbg varies from 80-200.  He checks fasting only.  We have run out of PCGM sensors.   Past Medical History:  Diagnosis Date   Colon polyps    Diabetes mellitus type II    Diabetic neuropathy ([place]) 12/07/2010   Diverticulosis    ED (erectile dysfunction)    rare   HLD (hyperlipidemia)    HTN (hypertension)    Obesity     Past Surgical History:  Procedure Laterality Date   APPENDECTOMY     INGUINAL HERNIA REPAIR  1974   KNEE ARTHROSCOPY  1986   Right   Left Hydrocele surgery  1974   REPLACEMENT UNICONDYLAR JOINT KNEE  2009   TESTICLE REMOVAL  1974   Left   TONSILLECTOMY     VASECTOMY  1981    Social History   Socioeconomic History   Marital status: Married    Spouse name: Not on file   Number of children: Not on file   Years of education: Not on file   Highest education level: Not on file  Occupational History   Occupation: Clinical biochemist, [employer]  Tobacco Use   Smoking status: Every Day    Packs/day: 0.75    Years: 30.00    Pack years: 22.50    Types: Cigarettes    Start date: 08/18/2017   Smokeless tobacco: Never   Tobacco comments:    ? quit 2011  Substance and Sexual Activity   Alcohol use: Yes    Alcohol/week: 0.0 standard drinks    Comment: Socially   Drug use: No   Sexual activity: Not on file  Other Topics Concern   Not on file  Social History Narrative   Not on file   Social Determinants of Health   Financial Resource Strain: Low Risk    Difficulty of Paying Living Expenses: Not very hard  Food Insecurity: Not on file  Transportation Needs: Not on file  Physical  Activity: Not on file  Stress: Not on file  Social Connections: Not on file  Intimate Partner Violence: Not on file    Current Outpatient Medications on File Prior to Visit  Medication Sig Dispense Refill   aspirin 81 MG tablet Take 81 mg by mouth daily.     atorvastatin (LIPITOR) 20 MG tablet TAKE 1 TABLET BY MOUTH  DAILY 90 tablet 3   gabapentin (NEURONTIN) 100 MG capsule TAKE 1 CAPSULE BY MOUTH 3  TIMES DAILY 270 capsule 3   glucose blood test strip 1 each by Other route 2 (two) times daily. Use as instructed - One Touch Verio     insulin detemir (LEVEMIR FLEXTOUCH) 100 UNIT/ML FlexPen Inject 46 Units into the skin 2 (two) times daily.     Insulin Pen Needle (BD PEN NEEDLE NANO U/F) 32G X 4 MM MISC USE ONE DAILY ALONG WITH YOUR INSULIN PEN 100 each 3   Lancets 28G MISC by Does not apply route as directed.     lisinopril-hydrochlorothiazide (ZESTORETIC) 20-12.5 MG tablet Take 1 tablet by mouth daily. 90 tablet 3   Multiple Vitamin (  MULTIVITAMIN WITH MINERALS) TABS tablet Take 1 tablet by mouth daily.     Multiple Vitamins-Minerals (VISION FORMULA PO) Take 1 tablet by mouth daily.     sildenafil (VIAGRA) 100 MG tablet Take 0.5-1 tablets (50-100 mg total) by mouth daily as needed for erectile dysfunction. 20 tablet 3   Current Facility-Administered Medications on File Prior to Visit  Medication Dose Route Frequency Provider Last Rate Last Admin   0.9 %  sodium chloride infusion  500 mL Intravenous Continuous Milus Banister, MD        No Known Allergies  Family History  Problem Relation Age of Onset   Diabetes Father    Cancer Father        mouth   Mental illness Mother        ?    Diabetes Sister    Colon cancer Neg Hx    Esophageal cancer Neg Hx    Rectal cancer Neg Hx    Stomach cancer Neg Hx     BP (!) 162/90    Pulse 70    Ht 5\' 11"  (1.803 m)    Wt 220 lb 6.4 oz (100 kg)    SpO2 98%    BMI 30.74 kg/m     Review of Systems denies weight loss, sob, memory loss.  He  has intermitt N/V.      Objective:   Physical Exam VITAL SIGNS:  See vs page GENERAL: no distress Pulses: dorsalis pedis intact bilat.   MSK: no deformity of the feet CV:2+ bilat leg edema.   Skin:  no ulcer on the feet.  normal color and temp on the feet.  Neuro: sensation is intact to touch on the feet.      Lab Results  Component Value Date   HGBA1C 8.2 (A) 03/13/2021   Lab Results  Component Value Date   CREATININE 0.86 11/07/2020   BUN 21 11/07/2020   NA 144 11/07/2020   K 5.5 (H) 11/07/2020   CL 109 11/07/2020   CO2 27 11/07/2020   I have reviewed outside records, and summarized: Pt was noted to have elevated A1c, and referred here.  Wellness, dyslipidemia, and dyspnea were also addressed.      Assessment & Plan:  Insulin-requiring type 2 DM: uncontrolled.  He declines continuous glucose monitor, and we are out of professional sensors N/V, uncertain etiology and prognosis  Patient Instructions  Your blood pressure is high today.  Please see your primary care provider soon, to have it rechecked good diet and exercise significantly improve the control of your diabetes.  please let me know if you wish to be referred to a dietician.  high blood sugar is very risky to your health.  you should see an eye doctor and dentist every year.  It is very important to get all recommended vaccinations.  Controlling your blood pressure and cholesterol drastically reduces the damage diabetes does to your body.  Those who smoke should quit.  Please discuss these with your doctor.  check your blood sugar twice a day.  vary the time of day when you check, between before the 3 meals, and at bedtime.  also check if you have symptoms of your blood sugar being too high or too low.  please keep a record of the readings and bring it to your next appointment here (or you can bring the meter itself).  You can write it on any piece of paper.  please call us sooner if your blood sugar  goes below 70, or  if most of your readings are over 200.   We will need to take this complex situation in stages.   I have sent a prescription to your pharmacy, to half the metformin.     Please come back for a follow-up appointment in 1 month

## 2021-04-04 ENCOUNTER — Telehealth: Payer: Self-pay

## 2021-04-04 NOTE — Progress Notes (Signed)
? ? ?  Chronic Care Management ?Pharmacy Assistant  ? ?Name: Connor Moore  MRN: [id number] DOB: Feb 14, 1950 ? ?Reason for Encounter: CCM Counsellor) ?  ?Medications: ?Outpatient Encounter Medications as of 04/04/2021  ?Medication Sig  ? aspirin 81 MG tablet Take 81 mg by mouth daily.  ? atorvastatin (LIPITOR) 20 MG tablet TAKE 1 TABLET BY MOUTH  DAILY  ? gabapentin (NEURONTIN) 100 MG capsule TAKE 1 CAPSULE BY MOUTH 3  TIMES DAILY  ? glucose blood test strip 1 each by Other route 2 (two) times daily. Use as instructed - One Touch Verio  ? insulin detemir (LEVEMIR FLEXTOUCH) 100 UNIT/ML FlexPen Inject 46 Units into the skin 2 (two) times daily.  ? Insulin Pen Needle (BD PEN NEEDLE NANO U/F) 32G X 4 MM MISC USE ONE DAILY ALONG WITH YOUR INSULIN PEN  ? Lancets 28G MISC by Does not apply route as directed.  ? lisinopril-hydrochlorothiazide (ZESTORETIC) 20-12.5 MG tablet Take 1 tablet by mouth daily.  ? metFORMIN (GLUCOPHAGE-XR) 500 MG 24 hr tablet Take 2 tablets (1,000 mg total) by mouth daily.  ? Multiple Vitamin (MULTIVITAMIN WITH MINERALS) TABS tablet Take 1 tablet by mouth daily.  ? Multiple Vitamins-Minerals (VISION FORMULA PO) Take 1 tablet by mouth daily.  ? sildenafil (VIAGRA) 100 MG tablet Take 0.5-1 tablets (50-100 mg total) by mouth daily as needed for erectile dysfunction.  ? ?Facility-Administered Encounter Medications as of 04/04/2021  ?Medication  ? 0.9 %  sodium chloride infusion  ? ?BRIX BREARLEY was contacted to remind of upcoming telephone visit with Charlene Brooke on 04/07/2021 at 1:00. Patient was reminded to have any blood glucose and blood pressure readings available for review at appointment.  ? ?Message was left reminding patient of appointment. ? ?Star Rating Drugs: ?Medication:   Last Fill: Day Supply ?Metformin 500 mg  03/14/2021 90  ?Atorvastatin 20 mg  03/28/2021 90 ?Lisinopril-HCTZ 20-12.5 mg 01/12/2021 90 ?Fill dates verified with [facility] ? ?Charlene Brooke,  CPP notified ? ?Marijean Niemann, RMA ?Clinical Pharmacy Assistant ?[phone] ? ? ? ? ? ?

## 2021-04-07 ENCOUNTER — Telehealth: Payer: Medicare Other

## 2021-04-13 NOTE — Progress Notes (Signed)
Called patient to reschedule his appointment that he cancelled on 04/07/2021 @ 1:00 with Charlene Brooke (for DM). No answer; left message. ? ?Charlene Brooke, CPP notified ? ?Marijean Niemann, RMA ?Clinical Pharmacy Assistant ?[phone] ? ? ? ? ?

## 2021-04-14 ENCOUNTER — Other Ambulatory Visit: Payer: Self-pay | Admitting: Family Medicine

## 2021-04-15 ENCOUNTER — Other Ambulatory Visit: Payer: Self-pay | Admitting: *Deleted

## 2021-04-15 MED ORDER — LEVEMIR FLEXTOUCH 100 UNIT/ML ~~LOC~~ SOPN: 45.0000 [IU] | PEN_INJECTOR | Freq: Two times a day (BID) | SUBCUTANEOUS | 2 refills | Status: DC

## 2021-04-17 ENCOUNTER — Telehealth: Payer: Self-pay

## 2021-04-17 NOTE — Telephone Encounter (Signed)
[facility] Night - Client ?TELEPHONE ADVICE RECORD ?[facility]? ?Patient ?Name: ?Connor ?E Moore ?Gender: Male ?DOB: 09-11-1950 ?Age: 71 Y 15 D ?Return ?Phone ?Number: ?[id number] ?(Primary), ?[id number] ?(Secondary) ?Address: ?City/ ?State/ ?Zip: ?[place] ?[address] ?Client [facility] Night - Client ?Client Site [facility] ?Provider Copland, Frederico Hamman - MD ?Contact Type Call ?Who Is Calling Patient / Member / Family / Caregiver ?Call Type Triage / Clinical ?Caller Name Ata Pecha ?Relationship To Patient Spouse ?Return Phone Number [phone] (Primary) ?Chief Complaint Prescription Refill or Medication Request (non ?symptomatic) ?Reason for Call Medication Question / Request ?Initial Comment Caller states husbands insulin is low and needs ?a new prescription called in with a new pin as ?pharmacy is out of pins prescribed ?Translation No ?Nurse Assessment ?Nurse: Marcello Moores, RN, [place] Date/Time Eilene Ghazi Time): 04/14/2021 7:41:12 PM ?Confirm and document reason for call. If ?symptomatic, describe symptoms. ?---Caller states patient needs a prescription refill for ?his insulin. States pt will run out of insulin Saturday. ?States patient has worsening swelling to legs. ?Does the patient have any new or worsening ?symptoms? ---Yes ?Will a triage be completed? ---Yes ?Related visit to physician within the last 2 weeks? ---No ?Does the PT have any chronic conditions? (i.e. ?diabetes, asthma, this includes High risk factors for ?pregnancy, etc.) ?---Yes ?List chronic conditions. ---IDDM, peripheral edema, HTN, ?Is this a behavioral health or substance abuse call? ---No ?Guidelines ?Guideline Title Affirmed Question Affirmed Notes Nurse Date/Time (Eastern ?Time) ?Leg Swelling and ?Edema ?[1] MODERATE ?leg swelling (e.g., ?swelling extends ?up to knees) AND ?[2] new-onset or ?worsening ?Marcello Moores, RN, [place] 04/14/2021 7:44:29 ?PM ?PLEASE NOTE:  All timestamps contained within this report are represented as [place] Standard Time. ?CONFIDENTIALTY NOTICE: This fax transmission is intended only for the addressee. It contains information that is legally privileged, confidential or ?otherwise protected from use or disclosure. If you are not the intended recipient, you are strictly prohibited from reviewing, disclosing, copying using ?or disseminating any of this information or taking any action in reliance on or regarding this information. If you have received this fax in error, please ?notify us immediately by telephone so that we can arrange for its return to [place]. Phone: [phone], Toll-Free: [phone], Fax: [phone] ?Page: 2 of 2 ?Call Id: [id number] ?Disp. Time (Eastern ?Time) Disposition Final User ?04/14/2021 7:18:35 PM Send To Nurse Graylon Gunning, RN, [place] ?04/14/2021 7:48:27 PM See PCP within 24 Hours Yes Marcello Moores, RN, [place] ?Caller Disagree/Comply Comply ?Caller Understands Yes ?PreDisposition Call Doctor ?Care Advice Given Per Guideline ?SEE PCP WITHIN 24 HOURS: * IF OFFICE WILL BE CLOSED: You need to be seen within the next 24 hours. A clinic or an ?urgent care center is often a good source of care if your doctor's office is closed or you can't get an appointment. LEG SWELLING ?OR EDEMA: * Elevate your legs or try to lie down one or two times a day for 20 minutes. * Avoid socks with an elastic band at the ?top. * Wear comfortable shoes. CALL BACK IF: * Breathing difficulty or chest pain occurs * You become worse CARE ADVICE ?given per Leg Swelling and Edema (Adult) guideline. ?Referrals ?GO TO FACILITY UNDECIDE ?

## 2021-04-17 NOTE — Telephone Encounter (Signed)
Refill sent in on 04/15/2021.  Connor Moore was notified of this via [place].  ?

## 2021-04-18 ENCOUNTER — Other Ambulatory Visit: Payer: Self-pay | Admitting: *Deleted

## 2021-04-18 MED ORDER — LEVEMIR FLEXTOUCH 100 UNIT/ML ~~LOC~~ SOPN: 45.0000 [IU] | PEN_INJECTOR | Freq: Two times a day (BID) | SUBCUTANEOUS | 3 refills | Status: DC

## 2021-04-23 ENCOUNTER — Other Ambulatory Visit: Payer: Self-pay

## 2021-04-23 ENCOUNTER — Emergency Department (HOSPITAL_BASED_OUTPATIENT_CLINIC_OR_DEPARTMENT_OTHER): Payer: Medicare Other

## 2021-04-23 ENCOUNTER — Emergency Department (HOSPITAL_BASED_OUTPATIENT_CLINIC_OR_DEPARTMENT_OTHER)
Admission: EM | Admit: 2021-04-23 | Discharge: 2021-04-23 | Disposition: A | Discharge status: Home / Self Care | Payer: Medicare Other | Attending: Emergency Medicine | Admitting: Emergency Medicine

## 2021-04-23 DIAGNOSIS — I10 Essential (primary) hypertension: Secondary | ICD-10-CM | POA: Insufficient documentation

## 2021-04-23 DIAGNOSIS — R1013 Epigastric pain: Secondary | ICD-10-CM | POA: Diagnosis not present

## 2021-04-23 DIAGNOSIS — E1140 Type 2 diabetes mellitus with diabetic neuropathy, unspecified: Secondary | ICD-10-CM | POA: Diagnosis not present

## 2021-04-23 DIAGNOSIS — Z7982 Long term (current) use of aspirin: Secondary | ICD-10-CM | POA: Diagnosis not present

## 2021-04-23 DIAGNOSIS — Z79899 Other long term (current) drug therapy: Secondary | ICD-10-CM | POA: Insufficient documentation

## 2021-04-23 DIAGNOSIS — Z7984 Long term (current) use of oral hypoglycemic drugs: Secondary | ICD-10-CM | POA: Diagnosis not present

## 2021-04-23 DIAGNOSIS — R110 Nausea: Secondary | ICD-10-CM | POA: Diagnosis not present

## 2021-04-23 DIAGNOSIS — Z794 Long term (current) use of insulin: Secondary | ICD-10-CM | POA: Diagnosis not present

## 2021-04-23 DIAGNOSIS — R748 Abnormal levels of other serum enzymes: Secondary | ICD-10-CM

## 2021-04-23 DIAGNOSIS — R1011 Right upper quadrant pain: Secondary | ICD-10-CM | POA: Diagnosis not present

## 2021-04-23 LAB — CBC WITH DIFFERENTIAL/PLATELET
Abs Immature Granulocytes: 0.03 10*3/uL (ref 0.00–0.07)
Basophils Absolute: 0 10*3/uL (ref 0.0–0.1)
Basophils Relative: 0 %
Eosinophils Absolute: 0.2 10*3/uL (ref 0.0–0.5)
Eosinophils Relative: 1 %
HCT: 49.2 % (ref 39.0–52.0)
Hemoglobin: 16.4 g/dL (ref 13.0–17.0)
Immature Granulocytes: 0 %
Lymphocytes Relative: 26 %
Lymphs Abs: 2.9 10*3/uL (ref 0.7–4.0)
MCH: 31.2 pg (ref 26.0–34.0)
MCHC: 33.3 g/dL (ref 30.0–36.0)
MCV: 93.5 fL (ref 80.0–100.0)
Monocytes Absolute: 1 10*3/uL (ref 0.1–1.0)
Monocytes Relative: 9 %
Neutro Abs: 7.1 10*3/uL (ref 1.7–7.7)
Neutrophils Relative %: 64 %
Platelets: 266 10*3/uL (ref 150–400)
RBC: 5.26 MIL/uL (ref 4.22–5.81)
RDW: 13.2 % (ref 11.5–15.5)
WBC: 11.2 10*3/uL — ABNORMAL HIGH (ref 4.0–10.5)
nRBC: 0 % (ref 0.0–0.2)

## 2021-04-23 LAB — COMPREHENSIVE METABOLIC PANEL
ALT: 19 U/L (ref 0–44)
AST: 15 U/L (ref 15–41)
Albumin: 4.5 g/dL (ref 3.5–5.0)
Alkaline Phosphatase: 73 U/L (ref 38–126)
Anion gap: 10 (ref 5–15)
BUN: 18 mg/dL (ref 8–23)
CO2: 22 mmol/L (ref 22–32)
Calcium: 9.9 mg/dL (ref 8.9–10.3)
Chloride: 105 mmol/L (ref 98–111)
Creatinine, Ser: 0.75 mg/dL (ref 0.61–1.24)
GFR, Estimated: >60 mL/min (ref >60)
Glucose, Bld: 93 mg/dL (ref 70–99)
Potassium: 4.3 mmol/L (ref 3.5–5.1)
Sodium: 137 mmol/L (ref 135–145)
Total Bilirubin: 0.8 mg/dL (ref 0.3–1.2)
Total Protein: 7.5 g/dL (ref 6.5–8.1)

## 2021-04-23 LAB — URINALYSIS, ROUTINE W REFLEX MICROSCOPIC
Bilirubin Urine: NEGATIVE
Glucose, UA: NEGATIVE mg/dL
Hgb urine dipstick: NEGATIVE
Ketones, ur: NEGATIVE mg/dL
Leukocytes,Ua: NEGATIVE
Nitrite: NEGATIVE
Protein, ur: 30 mg/dL — AB
Specific Gravity, Urine: 1.024 (ref 1.005–1.030)
pH: 5.5 (ref 5.0–8.0)

## 2021-04-23 LAB — LIPASE, BLOOD: Lipase: 92 U/L — ABNORMAL HIGH (ref 11–51)

## 2021-04-23 MED ORDER — ONDANSETRON 8 MG PO TBDP: 8.0000 mg | ORAL_TABLET | Freq: Three times a day (TID) | ORAL | 0 refills | Status: DC | PRN

## 2021-04-23 MED ORDER — PANTOPRAZOLE SODIUM 40 MG PO TBEC: 40.0000 mg | DELAYED_RELEASE_TABLET | Freq: Every day | ORAL | 0 refills | Status: DC

## 2021-04-23 NOTE — ED Notes (Signed)
US bedside

## 2021-04-23 NOTE — ED Provider Notes (Signed)
?[facility] ?Provider Note ? ? ?CSN: [id number] ?Arrival date & time: 04/23/21  1011 ? ?  ? ?History ? ?Chief Complaint  ?Patient presents with  ? Abdominal Pain  ? ? ?Connor Moore is a 71 y.o. male. ? ? ?Abdominal Pain ?Associated symptoms: no fever   ? ?Patient has history of diabetes, hyperlipidemia, hypertension, diabetic neuropathy who presents with complaints of abdominal pain.  Patient has been having some episodes off and on over the last couple of weeks.  However in the last few days he has had more persistent increasing pain.  Patient states on Thursday he started having pain in his abdomen that lasted almost for a day.  The pain was in his right upper abdomen and moved towards his flank and back.  He has noticed that whenever he eats certain foods he gets worse especially greasy foods.  He did have an episode of nausea and vomiting this morning.  He did have some pain again this morning but it has resolved again at this point.  Patient thinks it could be his gallbladder.  Family members have had this problem.  He was coming to the ED for evaluation. ? ?Home Medications ?Prior to Admission medications   ?Medication Sig Start Date End Date Taking? Authorizing Provider  ?ondansetron (ZOFRAN-ODT) 8 MG disintegrating tablet Take 1 tablet (8 mg total) by mouth every 8 (eight) hours as needed for nausea or vomiting. 04/23/21  Yes Dorie Rank, MD  ?pantoprazole (PROTONIX) 40 MG tablet Take 1 tablet (40 mg total) by mouth daily for 14 days. 04/23/21 05/07/21 Yes Dorie Rank, MD  ?aspirin 81 MG tablet Take 81 mg by mouth daily.    [provider]  ?atorvastatin (LIPITOR) 20 MG tablet TAKE 1 TABLET BY MOUTH  DAILY 12/11/20   Copland, Frederico Hamman, MD  ?gabapentin (NEURONTIN) 100 MG capsule TAKE 1 CAPSULE BY MOUTH 3  TIMES DAILY 08/16/20   Copland, Frederico Hamman, MD  ?glucose blood test strip 1 each by Other route 2 (two) times daily. Use as instructed - One Touch Verio    [provider]  ?insulin detemir (LEVEMIR FLEXTOUCH) 100 UNIT/ML FlexPen Inject 45 Units into the skin 2 (two) times daily. 04/18/21   Copland, Frederico Hamman, MD  ?Insulin Pen Needle (BD PEN NEEDLE NANO U/F) 32G X 4 MM MISC USE ONE DAILY ALONG WITH YOUR INSULIN PEN 03/03/20   Copland, Frederico Hamman, MD  ?Lancets 28G MISC by Does not apply route as directed.    [provider]  ?lisinopril-hydrochlorothiazide (ZESTORETIC) 20-12.5 MG tablet Take 1 tablet by mouth daily. 11/07/20   Copland, Frederico Hamman, MD  ?metFORMIN (GLUCOPHAGE-XR) 500 MG 24 hr tablet Take 2 tablets (1,000 mg total) by mouth daily. 03/13/21   Renato Shin, MD  ?Multiple Vitamin (MULTIVITAMIN WITH MINERALS) TABS tablet Take 1 tablet by mouth daily.    [provider]  ?Multiple Vitamins-Minerals (VISION FORMULA PO) Take 1 tablet by mouth daily.    [provider]  ?sildenafil (VIAGRA) 100 MG tablet Take 0.5-1 tablets (50-100 mg total) by mouth daily as needed for erectile dysfunction. 04/20/20   [name], MD  ?   ? ?Allergies    ?Patient has no known allergies.   ? ?Review of Systems   ?Review of Systems  ?Constitutional:  Negative for fever.  ?Gastrointestinal:  Positive for abdominal pain.  ? ?Physical Exam ?Updated Vital Signs ?BP (!) 146/29   Pulse 67   Temp 98.3 ?F (36.8 ?C) (Oral)   Resp 18  Ht 1.803 m ('5\' 11"'$ )   Wt 97.5 kg   SpO2 98%   BMI 29.99 kg/m?  ?Physical Exam ?Vitals and nursing note reviewed.  ?Constitutional:   ?   General: He is not in acute distress. ?   Appearance: He is well-developed.  ?HENT:  ?   Head: Normocephalic and atraumatic.  ?   Right Ear: External ear normal.  ?   Left Ear: External ear normal.  ?Eyes:  ?   General: No scleral icterus.    ?   Right eye: No discharge.     ?   Left eye: No discharge.  ?   Conjunctiva/sclera: Conjunctivae normal.  ?Neck:  ?   Trachea: No tracheal deviation.  ?Cardiovascular:  ?   Rate and Rhythm: Normal rate and regular rhythm.  ?Pulmonary:  ?   Effort: Pulmonary effort is  normal. No respiratory distress.  ?   Breath sounds: Normal breath sounds. No stridor. No wheezing or rales.  ?Abdominal:  ?   General: Bowel sounds are normal. There is no distension.  ?   Palpations: Abdomen is soft.  ?   Tenderness: There is no abdominal tenderness. There is no guarding or rebound.  ?Musculoskeletal:     ?   General: No tenderness or deformity.  ?   Cervical back: Neck supple.  ?Skin: ?   General: Skin is warm and dry.  ?   Findings: No rash.  ?Neurological:  ?   General: No focal deficit present.  ?   Mental Status: He is alert.  ?   Cranial Nerves: No cranial nerve deficit (no facial droop, extraocular movements intact, no slurred speech).  ?   Sensory: No sensory deficit.  ?   Motor: No abnormal muscle tone or seizure activity.  ?   Coordination: Coordination normal.  ?Psychiatric:     ?   Mood and Affect: Mood normal.  ? ? ?ED Results / Procedures / Treatments   ?Labs ?(all labs ordered are listed, but only abnormal results are displayed) ?Labs Reviewed  ?CBC WITH DIFFERENTIAL/PLATELET - Abnormal; Notable for the following components:  ?    Result Value  ? WBC 11.2 (*)   ? All other components within normal limits  ?LIPASE, BLOOD - Abnormal; Notable for the following components:  ? Lipase 92 (*)   ? All other components within normal limits  ?URINALYSIS, ROUTINE W REFLEX MICROSCOPIC - Abnormal; Notable for the following components:  ? Protein, ur 30 (*)   ? All other components within normal limits  ?COMPREHENSIVE METABOLIC PANEL  ? ? ?EKG ?None ? ?Radiology ?US Abdomen Limited RUQ (LIVER/GB) ? ?Result Date: 04/23/2021 ?CLINICAL DATA:  Right upper quadrant pain and nausea EXAM: ULTRASOUND ABDOMEN LIMITED RIGHT UPPER QUADRANT COMPARISON:  None. FINDINGS: Gallbladder: No gallstones or wall thickening visualized. No sonographic Murphy sign noted by sonographer. Common bile duct: Diameter: 0.3 cm Liver: Small focus of focal fatty deposition adjacent to the gallbladder fossa, incidental and benign.  Within normal limits in parenchymal echogenicity. Portal vein is patent on color Doppler imaging with normal direction of blood flow towards the liver. Other: None. IMPRESSION: No sonographic finding to explain the patient's right upper quadrant pain. Electronically Signed   By: Delanna Ahmadi M.D.   On: 04/23/2021 14:30   ? ?Procedures ?Procedures  ? ? ?Medications Ordered in ED ?Medications - No data to display ? ?ED Course/ Medical Decision Making/ A&P ?Clinical Course as of 04/23/21 1550  ?Nancy Fetter Apr 23, 2021  ?  1527 Lipase, blood(!) ?Lipase increased at 92 [JK]  ?1527 CBC with Differential(!) ?White blood cell count increased at 11.2 [JK]  ?1527 Comprehensive metabolic panel ?Normal [JK]  ?1527 Urinalysis, Routine w reflex microscopic Urine, Clean Catch(!) ?Normal [JK]  ?1528 US Abdomen Limited RUQ (LIVER/GB) ?Ultrasound does not show any evidence of gallstones or other acute abnormality [JK]  ?  ?Clinical Course User Index ?[JK] Dorie Rank, MD  ? ?                        ?Medical Decision Making ?Amount and/or Complexity of Data Reviewed ?Labs: ordered. Decision-making details documented in ED Course. ?Radiology: ordered. Decision-making details documented in ED Course. ? ?Risk ?Prescription drug management. ? ? ?Patient presented to ED with complaints of right upper quadrant abdominal pain.  Patient felt symptoms were related to food intake.  Concerned about possible cholecystitis, biliary colic, peptic ulcer disease, pancreatitis.   Labs show slight elevation of WBC and lipase. Pt is pain free in the ED.  No abd ttp.  Ultrasound does not show any signs of biliary ductal dilatation.  No signs of gallstones.  Patient has normal LFTs.  Symptoms are not typical for pancreatitis.  Possible patient could be having some issues with peptic ulcer disease.  We will have him start a course of antacids.  He will also need repeat evaluation of his lipase.  If it remains persistently elevated he may need further imaging such  as MRI of the pancreas.  Discussed findings with patient and his wife.  Patient will follow-up closely with his PCP.  Warning signs and precautions discussed. ? ? ? ? ? ? ? ?Final Clinical Impression(s) / ED Diagnos

## 2021-04-23 NOTE — Discharge Instructions (Addendum)
The pancreas enzyme was slightly elevated today.  Otherwise there were no significant abnormalities on your blood test.  The ultrasound did not show any signs of gallstones or bile duct blockages.  The lipase enzyme will need to be rechecked.  If it remains elevated additional testing can be performed such as MRI.  Follow-up with your primary doctor and consider seeing a GI doctor. ? ?Return to the ER for recurrent episodes, worsening symptoms. ?

## 2021-04-23 NOTE — ED Triage Notes (Signed)
Pt arrived POV and ambulatory. Pt reports for approx 2 weeks he has been experiencing intermittent abdominal pain in the RUQ. Pt describes pain as dull but very uncomfortable with frequency increasing as time goes on. Pt reports pain has been more often while eating. Pt denies N/V/D at present but reports vomiting this morning however it is his normal to vomit weekly. Pt still has gallbladder, denies hx with the gallbladder.  ?

## 2021-05-04 ENCOUNTER — Encounter: Payer: Self-pay | Admitting: Family Medicine

## 2021-05-04 ENCOUNTER — Ambulatory Visit (INDEPENDENT_AMBULATORY_CARE_PROVIDER_SITE_OTHER): Payer: Medicare Other | Admitting: Family Medicine

## 2021-05-04 VITALS — BP 138/90 | HR 60 | Temp 98.1°F | Ht 71.0 in | Wt 216.2 lb

## 2021-05-04 DIAGNOSIS — Z794 Long term (current) use of insulin: Secondary | ICD-10-CM | POA: Diagnosis not present

## 2021-05-04 DIAGNOSIS — R1011 Right upper quadrant pain: Secondary | ICD-10-CM

## 2021-05-04 DIAGNOSIS — R748 Abnormal levels of other serum enzymes: Secondary | ICD-10-CM

## 2021-05-04 DIAGNOSIS — R1013 Epigastric pain: Secondary | ICD-10-CM

## 2021-05-04 DIAGNOSIS — I10 Essential (primary) hypertension: Secondary | ICD-10-CM | POA: Diagnosis not present

## 2021-05-04 DIAGNOSIS — K219 Gastro-esophageal reflux disease without esophagitis: Secondary | ICD-10-CM | POA: Diagnosis not present

## 2021-05-04 DIAGNOSIS — E1142 Type 2 diabetes mellitus with diabetic polyneuropathy: Secondary | ICD-10-CM

## 2021-05-04 LAB — LIPASE: Lipase: 47 U/L (ref 11.0–59.0)

## 2021-05-04 LAB — AMYLASE: Amylase: 39 U/L (ref 27–131)

## 2021-05-04 MED ORDER — LISINOPRIL-HYDROCHLOROTHIAZIDE 20-25 MG PO TABS: 1.0000 | ORAL_TABLET | Freq: Every day | ORAL | 3 refills | Status: DC

## 2021-05-04 NOTE — Progress Notes (Signed)
? ? ?Connor Sheaffer T. Kymoni Monday, MD, [facility] ?Therapist, music at [facility] ?[place] ?[place], [address] ? ?Phone: [phone]  FAX: [phone] ? ?Connor Moore - 71 y.o. male  MRN [id number]  Date of Birth: 07/18/1950 ? ?Date: 05/04/2021  PCP: [name], MD  Referral: [name], MD ? ?Chief Complaint  ?Patient presents with  ? Diabetes  ? Follow-up  ?  ER Visit on 04/23/21  ? ? ?This visit occurred during the SARS-CoV-2 public health emergency.  Safety protocols were in place, including screening questions prior to the visit, additional usage of staff PPE, and extensive cleaning of exam room while observing appropriate contact time as indicated for disinfecting solutions.  ? ?Subjective:  ? ?Connor Moore is a 71 y.o. very pleasant male patient with Body mass index is 30.16 kg/m?. who presents with the following: ? ?F/u DM, but ER f/u ? ?Concerns about Endo consult, and felt it was not that helpful. ? ?Went to the ER, had some pain.  At that point, it had resolved. ?RUQ pain - then went to his low back. ?[place] is normal. [place] normal. ?He did have a mildly elevated lipase.  Confirmed that he is not an alcohol user. ? ?AM he will vomit in the morning.  This has been better on Protonix. ? ?He has also had some lower extremity edema. ?Medications below. ? ?HTN: Tolerating all medications without side effects ?Stable and at goal ?No CP, no sob. No HA. ? ?BP Readings from Last 3 Encounters:  ?05/04/21 138/90  ?04/23/21 (!) 146/29  ?03/13/21 (!) 162/90  ? ? ?Basic Metabolic Panel: ?   ?Component Value Date/Time  ? NA 137 04/23/2021 1333  ? K 4.3 04/23/2021 1333  ? CL 105 04/23/2021 1333  ? CO2 22 04/23/2021 1333  ? BUN 18 04/23/2021 1333  ? CREATININE 0.75 04/23/2021 1333  ? GLUCOSE 93 04/23/2021 1333  ? CALCIUM 9.9 04/23/2021 1333  ?  ? ?Diabetes Mellitus: Tolerating Medications: yes ?Compliance with diet: fair, Body mass index is 30.16 kg/m?. ?Exercise: minimal /  intermittent ?Avg blood sugars at home: Roughly 80-200 ?Foot problems: none ?Hypoglycemia: none ?No nausea, vomitting, blurred vision, polyuria. ? ?Lab Results  ?Component Value Date  ? HGBA1C 8.2 (A) 03/13/2021  ? HGBA1C 8.5 (H) 11/07/2020  ? HGBA1C 8.5 (A) 07/27/2020  ? ?Lab Results  ?Component Value Date  ? MICROALBUR <0.7 09/28/2019  ? [place] 39 11/07/2020  ? CREATININE 0.75 04/23/2021  ? ? ?Wt Readings from Last 3 Encounters:  ?05/04/21 216 lb 4 oz (98.1 kg)  ?04/23/21 215 lb (97.5 kg)  ?03/13/21 220 lb 6.4 oz (100 kg)  ?  ? ?Review of Systems is noted in the HPI, as appropriate ? ?Objective:  ? ?BP 138/90   Pulse 60   Temp 98.1 ?F (36.7 ?C) (Oral)   Ht '5\' 11"'$  (1.803 m)   Wt 216 lb 4 oz (98.1 kg)   SpO2 98%   BMI 30.16 kg/m?  ? ?GEN: No acute distress; alert,appropriate. ?PULM: Breathing comfortably in no respiratory distress ?PSYCH: Normally interactive.  ?CV: RRR, no m/g/r  ? ?Laboratory and Imaging Data: ?Results for orders placed or performed during the hospital encounter of 04/23/21  ?CBC with Differential  ?Result Value Ref Range  ? WBC 11.2 (H) 4.0 - 10.5 K/uL  ? RBC 5.26 4.22 - 5.81 MIL/uL  ? Hemoglobin 16.4 13.0 - 17.0 g/dL  ? HCT 49.2 39.0 - 52.0 %  ? MCV 93.5  80.0 - 100.0 fL  ? MCH 31.2 26.0 - 34.0 pg  ? MCHC 33.3 30.0 - 36.0 g/dL  ? RDW 13.2 11.5 - 15.5 %  ? Platelets 266 150 - 400 K/uL  ? nRBC 0.0 0.0 - 0.2 %  ? Neutrophils Relative % 64 %  ? Neutro Abs 7.1 1.7 - 7.7 K/uL  ? Lymphocytes Relative 26 %  ? Lymphs Abs 2.9 0.7 - 4.0 K/uL  ? Monocytes Relative 9 %  ? Monocytes Absolute 1.0 0.1 - 1.0 K/uL  ? Eosinophils Relative 1 %  ? Eosinophils Absolute 0.2 0.0 - 0.5 K/uL  ? Basophils Relative 0 %  ? Basophils Absolute 0.0 0.0 - 0.1 K/uL  ? Immature Granulocytes 0 %  ? Abs Immature Granulocytes 0.03 0.00 - 0.07 K/uL  ?Comprehensive metabolic panel  ?Result Value Ref Range  ? Sodium 137 135 - 145 mmol/L  ? Potassium 4.3 3.5 - 5.1 mmol/L  ? Chloride 105 98 - 111 mmol/L  ? CO2 22 22 - 32 mmol/L  ?  Glucose, Bld 93 70 - 99 mg/dL  ? BUN 18 8 - 23 mg/dL  ? Creatinine, Ser 0.75 0.61 - 1.24 mg/dL  ? Calcium 9.9 8.9 - 10.3 mg/dL  ? Total Protein 7.5 6.5 - 8.1 g/dL  ? Albumin 4.5 3.5 - 5.0 g/dL  ? AST 15 15 - 41 U/L  ? ALT 19 0 - 44 U/L  ? Alkaline Phosphatase 73 38 - 126 U/L  ? Total Bilirubin 0.8 0.3 - 1.2 mg/dL  ? GFR, Estimated >60 >60 mL/min  ? Anion gap 10 5 - 15  ?Lipase, blood  ?Result Value Ref Range  ? Lipase 92 (H) 11 - 51 U/L  ?Urinalysis, Routine w reflex microscopic Urine, Clean Catch  ?Result Value Ref Range  ? Color, Urine YELLOW YELLOW  ? APPearance CLEAR CLEAR  ? Specific Gravity, Urine 1.024 1.005 - 1.030  ? pH 5.5 5.0 - 8.0  ? Glucose, UA NEGATIVE NEGATIVE mg/dL  ? Hgb urine dipstick NEGATIVE NEGATIVE  ? Bilirubin Urine NEGATIVE NEGATIVE  ? Ketones, ur NEGATIVE NEGATIVE mg/dL  ? Protein, ur 30 (A) NEGATIVE mg/dL  ? Nitrite NEGATIVE NEGATIVE  ? Leukocytes,Ua NEGATIVE NEGATIVE  ? RBC / HPF 0-5 0 - 5 RBC/hpf  ? WBC, UA 0-5 0 - 5 WBC/hpf  ? Mucus PRESENT   ? Hyaline Casts, UA PRESENT   ?  ?US Abdomen Limited RUQ (LIVER/GB) ? ?Result Date: 04/23/2021 ?CLINICAL DATA:  Right upper quadrant pain and nausea EXAM: ULTRASOUND ABDOMEN LIMITED RIGHT UPPER QUADRANT COMPARISON:  None. FINDINGS: Gallbladder: No gallstones or wall thickening visualized. No sonographic Murphy sign noted by sonographer. Common bile duct: Diameter: 0.3 cm Liver: Small focus of focal fatty deposition adjacent to the gallbladder fossa, incidental and benign. Within normal limits in parenchymal echogenicity. Portal vein is patent on color Doppler imaging with normal direction of blood flow towards the liver. Other: None. IMPRESSION: No sonographic finding to explain the patient's right upper quadrant pain. Electronically Signed   By: Delanna Ahmadi M.D.   On: 04/23/2021 14:30    ? ?Assessment and Plan:  ? ?  ICD-10-CM   ?1. Right upper quadrant pain  R10.11   ?  ?2. Elevated lipase  R74.8 Lipase  ?  Amylase  ?  ?3. Epigastric pain  R10.13    ?  ?4. Gastroesophageal reflux disease, unspecified whether esophagitis present  K21.9   ?  ?5. Controlled type 2 diabetes mellitus with diabetic  polyneuropathy, with long-term current use of insulin (HCC)  E11.42   ? Z79.4   ?  ?6. Essential hypertension  I10   ?  ? ?Right upper quadrant pain and epigastric pain has resolved.  Did have an elevated lipase, so I am going to repeat his lipase and amylase today. ? ?Intermittent belching and abdominal pain improved on Protonix.  This certainly sounds like reflux, it is improved on a PPI, so I think an extended period of PPI is reasonable.  Start omeprazole. ? ?High blood pressure with some lower extremity edema.  Increase hydrochlorothiazide.  Blood pressure is somewhat elevated today. ? ?Continue current diabetic medication regimen including insulin, and recheck in several months. ? ?Meds ordered this encounter  ?Medications  ? lisinopril-hydrochlorothiazide (ZESTORETIC) 20-25 MG tablet  ?  Sig: Take 1 tablet by mouth daily.  ?  Dispense:  90 tablet  ?  Refill:  3  ? ?Medications Discontinued During This Encounter  ?Medication Reason  ? gabapentin (NEURONTIN) 100 MG capsule   ? lisinopril-hydrochlorothiazide (ZESTORETIC) 20-12.5 MG tablet   ? pantoprazole (PROTONIX) 40 MG tablet   ? ?Orders Placed This Encounter  ?Procedures  ? Lipase  ? Amylase  ? ? ?Follow-up: Return in about 3 months (around 08/03/2021). ? ?Dragon Medical One speech-to-text software was used for transcription in this dictation.  Possible transcriptional errors can occur using Editor, commissioning.  ? ?Signed, ? ?Casson Catena T. Kiesha Ensey, MD ? ? ?Outpatient Encounter Medications as of 05/04/2021  ?Medication Sig  ? aspirin 81 MG tablet Take 81 mg by mouth daily.  ? atorvastatin (LIPITOR) 20 MG tablet TAKE 1 TABLET BY MOUTH  DAILY  ? glucose blood test strip 1 each by Other route 2 (two) times daily. Use as instructed - One Touch Verio  ? insulin detemir (LEVEMIR FLEXTOUCH) 100 UNIT/ML FlexPen Inject 45 Units  into the skin 2 (two) times daily.  ? Insulin Pen Needle (BD PEN NEEDLE NANO U/F) 32G X 4 MM MISC USE ONE DAILY ALONG WITH YOUR INSULIN PEN  ? Lancets 28G MISC by Does not apply route as directed.  ? lisinopril-hydrochlo

## 2021-05-04 NOTE — Patient Instructions (Signed)
For stomach acid / vomitting:  Omeprazole 20 mg ?

## 2021-05-08 ENCOUNTER — Ambulatory Visit: Payer: Medicare Other | Admitting: Family Medicine

## 2021-06-08 DIAGNOSIS — Z1283 Encounter for screening for malignant neoplasm of skin: Secondary | ICD-10-CM | POA: Diagnosis not present

## 2021-06-08 DIAGNOSIS — D225 Melanocytic nevi of trunk: Secondary | ICD-10-CM | POA: Diagnosis not present

## 2021-06-08 DIAGNOSIS — B078 Other viral warts: Secondary | ICD-10-CM | POA: Diagnosis not present

## 2021-06-08 DIAGNOSIS — D485 Neoplasm of uncertain behavior of skin: Secondary | ICD-10-CM | POA: Diagnosis not present

## 2021-06-23 DIAGNOSIS — M25561 Pain in right knee: Secondary | ICD-10-CM | POA: Diagnosis not present

## 2021-07-04 DIAGNOSIS — D485 Neoplasm of uncertain behavior of skin: Secondary | ICD-10-CM | POA: Diagnosis not present

## 2021-07-04 DIAGNOSIS — L988 Other specified disorders of the skin and subcutaneous tissue: Secondary | ICD-10-CM | POA: Diagnosis not present

## 2021-07-17 ENCOUNTER — Telehealth: Payer: Self-pay

## 2021-07-17 NOTE — Progress Notes (Signed)
Chronic Care Management Pharmacy Assistant   Name: Connor Moore  MRN: [id number] DOB: 06-14-1950  Reason for Encounter:  CCM (Hypertension Disease State)  Recent office visits:  05/04/21 - [name], MD Right Upper Quadrant Pain Stop: Gabapentin 100 mg Stop: Pantoprazole 40 mg. Change: Metformin 4 tablets daily.  Change (increase) lisinopril-hydrochlorothiazide (ZESTORETIC) 20-25 MG tablet FU 3 months 11/07/20 - [name], MD - Patient presented for [facility] Wellness Visit. Labs: BMP, Brain Natriuretic Peptide, CBC, A1c, Hepatic Function Panel, Lipid and PSA, Medicare. Referral to Gastroenterology.   Recent consult visits:  03/13/21 Renato Shin, MD (Endocrinology) DM A1C 8.2 Change: Metformin 1,000 mg daily FU 1 month  Hospital visits:  04/23/21 [facility] ED - Discharged same day Final Diagnoses: Epigastric Pain and Elevated Lipase Start: ondansetron 8 MG disintegrating tablet Start: pantoprazole 40 MG tablet  Medications: Outpatient Encounter Medications as of 07/17/2021  Medication Sig   aspirin 81 MG tablet Take 81 mg by mouth daily.   atorvastatin (LIPITOR) 20 MG tablet TAKE 1 TABLET BY MOUTH  DAILY   glucose blood test strip 1 each by Other route 2 (two) times daily. Use as instructed - One Touch Verio   insulin detemir (LEVEMIR FLEXTOUCH) 100 UNIT/ML FlexPen Inject 45 Units into the skin 2 (two) times daily.   Insulin Pen Needle (BD PEN NEEDLE NANO U/F) 32G X 4 MM MISC USE ONE DAILY ALONG WITH YOUR INSULIN PEN   Lancets 28G MISC by Does not apply route as directed.   lisinopril-hydrochlorothiazide (ZESTORETIC) 20-25 MG tablet Take 1 tablet by mouth daily.   metFORMIN (GLUCOPHAGE-XR) 500 MG 24 hr tablet Take 2 tablets (1,000 mg total) by mouth daily. (Patient taking differently: Take 1,000 mg by mouth 2 (two) times daily.)   Multiple Vitamin (MULTIVITAMIN WITH MINERALS) TABS tablet Take 1 tablet by mouth daily.   Multiple Vitamins-Minerals (VISION FORMULA  PO) Take 1 tablet by mouth daily.   ondansetron (ZOFRAN-ODT) 8 MG disintegrating tablet Take 1 tablet (8 mg total) by mouth every 8 (eight) hours as needed for nausea or vomiting.   sildenafil (VIAGRA) 100 MG tablet Take 0.5-1 tablets (50-100 mg total) by mouth daily as needed for erectile dysfunction.   Facility-Administered Encounter Medications as of 07/17/2021  Medication   0.9 %  sodium chloride infusion    Recent Office Vitals: BP Readings from Last 3 Encounters:  05/04/21 138/90  04/23/21 (!) 146/29  03/13/21 (!) 162/90   Pulse Readings from Last 3 Encounters:  05/04/21 60  04/23/21 67  03/13/21 70    Wt Readings from Last 3 Encounters:  05/04/21 216 lb 4 oz (98.1 kg)  04/23/21 215 lb (97.5 kg)  03/13/21 220 lb 6.4 oz (100 kg)     Kidney Function Lab Results  Component Value Date/Time   CREATININE 0.75 04/23/2021 01:33 PM   CREATININE 0.86 11/07/2020 09:02 AM   GFR 88.91 11/07/2020 09:02 AM   GFRNONAA >60 04/23/2021 01:33 PM   GFRAA 100 03/29/2008 10:45 AM       Latest Ref Rng & Units 04/23/2021    1:33 PM 11/07/2020    9:02 AM 09/28/2019    9:01 AM  BMP  Glucose 70 - 99 mg/dL 93  70  238   BUN 8 - 23 mg/dL '18  21  24   '$ Creatinine 0.61 - 1.24 mg/dL 0.75  0.86  0.73   Sodium 135 - 145 mmol/L 137  144  137   Potassium 3.5 - 5.1 mmol/L 4.3  5.5  5.0   Chloride 98 - 111 mmol/L 105  109  107   CO2 22 - 32 mmol/L '22  27  22   '$ Calcium 8.9 - 10.3 mg/dL 9.9  10.0  9.4     Attempted contact with patient 3 times. Unsuccessful outreach. Will atttempt contact next month.   Current antihypertensive regimen:  lisinopril-hydrochlorothiazide (ZESTORETIC) 20-25 MG tablet  What recent interventions/DTPs have been made by any provider to improve Blood Pressure control since last CPP Visit:  Change (increase) lisinopril-hydrochlorothiazide (ZESTORETIC) 20-25 MG tablet   Adherence Review: Is the patient currently on ACE/ARB medication? Yes Does the patient have >5 day gap  between last estimated fill dates? No  Star Rating Drugs:  Medication:   Last Fill: Day Supply Atorvastatin 20 mg  06/21/21 90 Lisinopril-HCTZ 20-25 mg 05/04/21 90  Metformin 500 mg  05/15/21 90  Care Gaps: Annual wellness visit in last year? Yes 11/07/2020 Most Recent BP reading: 138/90 on 05/04/2021  If Diabetic: Most recent A1C reading: 8.2 on 03/13/2021 Last eye exam / retinopathy screening: Up to date Last diabetic foot exam: Up to date  Upcoming appointments: PCP appointment on 08/07/2021  Charlene Brooke, CPP notified  Marijean Niemann, [place] Assistant [phone]

## 2021-07-18 ENCOUNTER — Other Ambulatory Visit: Payer: Self-pay | Admitting: Family Medicine

## 2021-07-31 DIAGNOSIS — M1711 Unilateral primary osteoarthritis, right knee: Secondary | ICD-10-CM | POA: Diagnosis not present

## 2021-08-07 ENCOUNTER — Encounter: Payer: Self-pay | Admitting: Family Medicine

## 2021-08-07 ENCOUNTER — Ambulatory Visit (INDEPENDENT_AMBULATORY_CARE_PROVIDER_SITE_OTHER): Payer: Medicare Other | Admitting: Family Medicine

## 2021-08-07 VITALS — BP 148/80 | HR 67 | Temp 98.2°F | Ht 71.0 in | Wt 217.4 lb

## 2021-08-07 DIAGNOSIS — E1142 Type 2 diabetes mellitus with diabetic polyneuropathy: Secondary | ICD-10-CM

## 2021-08-07 DIAGNOSIS — I10 Essential (primary) hypertension: Secondary | ICD-10-CM

## 2021-08-07 DIAGNOSIS — Z794 Long term (current) use of insulin: Secondary | ICD-10-CM | POA: Diagnosis not present

## 2021-08-07 LAB — POCT GLYCOSYLATED HEMOGLOBIN (HGB A1C): Hemoglobin A1C: 11.2 % — AB (ref 4.0–5.6)

## 2021-08-07 MED ORDER — OZEMPIC (0.25 OR 0.5 MG/DOSE) 2 MG/3ML ~~LOC~~ SOPN: PEN_INJECTOR | SUBCUTANEOUS | 2 refills | Status: DC

## 2021-08-07 NOTE — Progress Notes (Signed)
Deshonda Cryderman T. Ariyona Eid, MD, [place] at [facility] [place], [address]  Phone: [phone]  FAX: [phone]  Connor Moore - 71 y.o. male  MRN [id number]  Date of Birth: 1950/04/25  Date: 08/07/2021  PCP: [name], MD  Referral: [name], MD  Chief Complaint  Patient presents with   Diabetes   Subjective:   Connor Moore is a 71 y.o. very pleasant male patient with Body mass index is 30.32 kg/m. who presents with the following:  Diabetes Mellitus: Tolerating Medications: yes Compliance with diet: fair, Body mass index is 30.32 kg/m. Exercise: minimal / intermittent Foot problems: none Hypoglycemia: none No nausea, vomitting, blurred vision, polyuria.  Levemir 45 units in the morning and 45 units at night.   Endocrinology decrease patient's metformin.  No other changes, and his blood sugars have gone up quite a bit.  Lab Results  Component Value Date   HGBA1C 11.2 (A) 08/07/2021   HGBA1C 8.2 (A) 03/13/2021   HGBA1C 8.5 (H) 11/07/2020   Lab Results  Component Value Date   MICROALBUR <0.7 09/28/2019   LDLCALC 39 11/07/2020   CREATININE 0.75 04/23/2021    Wt Readings from Last 3 Encounters:  08/07/21 217 lb 6 oz (98.6 kg)  05/04/21 216 lb 4 oz (98.1 kg)  04/23/21 215 lb (97.5 kg)     Review of Systems is noted in the HPI, as appropriate  Objective:   BP (!) 148/80   Pulse 67   Temp 98.2 F (36.8 C) (Oral)   Ht '5\' 11"'$  (1.803 m)   Wt 217 lb 6 oz (98.6 kg)   SpO2 95%   BMI 30.32 kg/m   GEN: No acute distress; alert,appropriate. PULM: Breathing comfortably in no respiratory distress PSYCH: Normally interactive.   Laboratory and Imaging Data: Results for orders placed or performed in visit on 08/07/21  POCT glycosylated hemoglobin (Hb A1C)  Result Value Ref Range   Hemoglobin A1C 11.2 (A) 4.0 - 5.6 %   HbA1c POC (<> result, manual entry)     HbA1c, POC  (prediabetic range)     HbA1c, POC (controlled diabetic range)       Assessment and Plan:     ICD-10-CM   1. Controlled type 2 diabetes mellitus with diabetic polyneuropathy, with long-term current use of insulin (HCC)  E11.42 POCT glycosylated hemoglobin (Hb A1C)   Z79.4     2. Essential hypertension  I10      Very unstable diabetes with failure of of insulin, metformin, and others in terms of his diabetes management.  He has been on essentially all types of oral diabetic medications in the past, as well.  I think that titrating him up on some Ozempic would be a good idea for multiple reasons including blood sugar control as well as weight loss.  Close follow-up, and I will plan on seeing him in 3 months.  Medication Management during today's office visit: Meds ordered this encounter  Medications   Semaglutide,0.25 or 0.'5MG'$ /DOS, (OZEMPIC, 0.25 OR 0.5 MG/DOSE,) 2 MG/3ML SOPN    Sig: For 1 month, inject 0.25 mg subcutaneously once a week.  After one month increase to 0.5 mg injection once a week    Dispense:  3 mL    Refill:  2   Medications Discontinued During This Encounter  Medication Reason   ondansetron (ZOFRAN-ODT) 8 MG disintegrating tablet No longer needed (for PRN medications)    Orders placed today  for conditions managed today: Orders Placed This Encounter  Procedures   POCT glycosylated hemoglobin (Hb A1C)    Follow-up if needed: Return in about 3 months (around 11/07/2021) for diabetes.  Dragon Medical One speech-to-text software was used for transcription in this dictation.  Possible transcriptional errors can occur using Editor, commissioning.   Signed,  [name]. Daphna Lafuente, MD   Outpatient Encounter Medications as of 08/07/2021  Medication Sig   aspirin 81 MG tablet Take 81 mg by mouth daily.   atorvastatin (LIPITOR) 20 MG tablet TAKE 1 TABLET BY MOUTH  DAILY   glucose blood test strip 1 each by Other route 2 (two) times daily. Use as instructed - One Touch  Verio   insulin detemir (LEVEMIR FLEXTOUCH) 100 UNIT/ML FlexPen Inject 45 Units into the skin 2 (two) times daily.   Insulin Pen Needle (BD PEN NEEDLE NANO U/F) 32G X 4 MM MISC USE ONE DAILY ALONG WITH YOUR INSULIN PEN   Lancets 28G MISC by Does not apply route as directed.   lisinopril-hydrochlorothiazide (ZESTORETIC) 20-25 MG tablet Take 1 tablet by mouth daily.   metFORMIN (GLUCOPHAGE-XR) 500 MG 24 hr tablet Take 2 tablets (1,000 mg total) by mouth daily. (Patient taking differently: Take 1,000 mg by mouth 2 (two) times daily.)   Multiple Vitamin (MULTIVITAMIN WITH MINERALS) TABS tablet Take 1 tablet by mouth daily.   Multiple Vitamins-Minerals (VISION FORMULA PO) Take 1 tablet by mouth daily.   Semaglutide,0.25 or 0.'5MG'$ /DOS, (OZEMPIC, 0.25 OR 0.5 MG/DOSE,) 2 MG/3ML SOPN For 1 month, inject 0.25 mg subcutaneously once a week.  After one month increase to 0.5 mg injection once a week   sildenafil (VIAGRA) 100 MG tablet Take 0.5-1 tablets (50-100 mg total) by mouth daily as needed for erectile dysfunction.   [DISCONTINUED] ondansetron (ZOFRAN-ODT) 8 MG disintegrating tablet Take 1 tablet (8 mg total) by mouth every 8 (eight) hours as needed for nausea or vomiting.   Facility-Administered Encounter Medications as of 08/07/2021  Medication   0.9 %  sodium chloride infusion

## 2021-08-18 ENCOUNTER — Encounter: Payer: Self-pay | Admitting: Family Medicine

## 2021-08-27 ENCOUNTER — Other Ambulatory Visit: Payer: Self-pay | Admitting: Family Medicine

## 2021-08-28 NOTE — Telephone Encounter (Signed)
Wife is calling in asking if someone is able to call her back as it is in regards to the medication.

## 2021-08-29 ENCOUNTER — Encounter: Payer: Self-pay | Admitting: Family Medicine

## 2021-08-30 NOTE — Telephone Encounter (Signed)
Connor Moore,   Could you see if there is any help available for them with Ozempic?

## 2021-08-30 NOTE — Telephone Encounter (Signed)
Left a message on voicemail advising that we are calling his wife because of a mychart message that we received.  Advised tpaitent to call the office back or send a mychart message.

## 2021-08-31 ENCOUNTER — Telehealth: Payer: Self-pay | Admitting: Family Medicine

## 2021-08-31 NOTE — Telephone Encounter (Signed)
Spoke with patient's wife, she reports income is >$100K so they will not qualify for Ozempic patient assistance. There are not any other programs available to help with cost unfortunately, they would like to proceed with filling Ozempic through their mail order pharmacy.

## 2021-08-31 NOTE — Telephone Encounter (Signed)
Spoke with patient - unfortunately their income is too high (>$100K) for them to qualify for patient assistance, and there are no other programs that I am aware of to help with cost of Ozempic in the coverage gap. They would like to proceed with filling 11-monthsupply from their mail order pharmacy.

## 2021-08-31 NOTE — Telephone Encounter (Signed)
Patient wife called about Ozempic and wanted to know if someone has checked on it being cheaper. Call back number [phone]

## 2021-09-01 MED ORDER — OZEMPIC (0.25 OR 0.5 MG/DOSE) 2 MG/3ML ~~LOC~~ SOPN: PEN_INJECTOR | SUBCUTANEOUS | 3 refills | Status: DC

## 2021-09-01 NOTE — Telephone Encounter (Signed)
Done and sent  

## 2021-09-01 NOTE — Addendum Note (Signed)
Addended by: [name] on: 09/01/2021 10:20 AM   Modules accepted: Orders

## 2021-09-08 MED ORDER — OZEMPIC (0.25 OR 0.5 MG/DOSE) 2 MG/3ML ~~LOC~~ SOPN
PEN_INJECTOR | SUBCUTANEOUS | 3 refills | Status: DC
Start: 2021-09-08 — End: 2021-12-11

## 2021-09-08 NOTE — Addendum Note (Signed)
Addended by: Carter Kitten on: 09/08/2021 03:05 PM   Modules accepted: Orders

## 2021-09-08 NOTE — Telephone Encounter (Signed)
Patients wife called in stating that [facility] is still saying that they do not have the order for the [place]. She says that if you all are able to call them ,could you please. [phone].

## 2021-09-08 NOTE — Telephone Encounter (Signed)
Call and spoke to [facility].  They stated they do have the Rx but they were needing to talk to patient in regards to his co-pay of $747.55 and to discuss payment options.  Spoke to [place] and advised her of this.  She will call [facility].

## 2021-09-08 NOTE — Telephone Encounter (Signed)
Refill for Ozempic re-sent to [facility].

## 2021-11-03 ENCOUNTER — Telehealth: Payer: Self-pay | Admitting: Family Medicine

## 2021-11-03 NOTE — Telephone Encounter (Signed)
Left message for patient to call back and schedule Medicare Annual Wellness Visit (AWV) either virtually or phone  Left  my jabber number [phone]   Last AWV 11/07/20    45 min for awv-i and in office appointments 30 min for awv-s  phone/virtual appointments

## 2021-11-08 ENCOUNTER — Other Ambulatory Visit: Payer: Self-pay | Admitting: Family Medicine

## 2021-12-08 ENCOUNTER — Other Ambulatory Visit: Payer: Self-pay | Admitting: Family Medicine

## 2021-12-09 NOTE — Progress Notes (Signed)
Hamed Debella T. Lalia Loudon, MD, [place] at [facility] [place], [address]  Phone: [phone]  FAX: [phone]  Connor Moore - 71 y.o. male  MRN [id number]  Date of Birth: 1950/10/26  Date: 12/11/2021  PCP: [name], MD  Referral: [name], MD  Chief Complaint  Patient presents with   Diabetes   Subjective:   Connor Moore is a 71 y.o. very pleasant male patient with Body mass index is 28.24 kg/m. who presents with the following:  Connor Moore is a well-known patient, he presents today for diabetes follow-up.  He is currently on Levemir, metformin, and I recently started him on Ozempic.  Blood sugars have been not been all that well-controlled recently.  Diabetes Mellitus: Tolerating Medications: yes Compliance with diet: fair, Body mass index is 28.24 kg/m. Exercise: minimal / intermittent Avg blood sugars at home: not checking Foot problems: none Hypoglycemia: none No nausea, vomitting, blurred vision, polyuria.  Lab Results  Component Value Date   HGBA1C 7.0 (A) 12/11/2021   HGBA1C 11.2 (A) 08/07/2021   HGBA1C 8.2 (A) 03/13/2021   Lab Results  Component Value Date   MICROALBUR <0.7 09/28/2019   LDLCALC 39 11/07/2020   CREATININE 0.75 04/23/2021    Wt Readings from Last 3 Encounters:  12/11/21 202 lb 8 oz (91.9 kg)  08/07/21 217 lb 6 oz (98.6 kg)  05/04/21 216 lb 4 oz (98.1 kg)    Metformin 1000 mg bid  Review of Systems is noted in the HPI, as appropriate  Objective:   BP 100/70   Pulse 83   Temp 98.3 F (36.8 C) (Oral)   Ht '5\' 11"'$  (1.803 m)   Wt 202 lb 8 oz (91.9 kg)   SpO2 96%   BMI 28.24 kg/m   GEN: No acute distress; alert,appropriate. PULM: Breathing comfortably in no respiratory distress PSYCH: Normally interactive.  CV: RRR, no m/g/r   Laboratory and Imaging Data:  Assessment and Plan:     ICD-10-CM   1. Controlled type 2 diabetes mellitus with  diabetic polyneuropathy, with long-term current use of insulin (HCC)  E11.42 POCT glycosylated hemoglobin (Hb A1C)   Z79.4      Diabetes is under fantastic control now starting Ozempic.  He also has increased his metformin to 1000 mg p.o. twice daily.  For that we can continue this, I am going to titrate up his Ozempic, and I suspect that this will help additionally with weight loss.  If he needs to drop his insulin dosing somewhat, I think that would be okay.  Medication Management during today's office visit: Meds ordered this encounter  Medications   Semaglutide, 1 MG/DOSE, 4 MG/3ML SOPN    Sig: Inject 1 mg as directed once a week.    Dispense:  9 mL    Refill:  3   metFORMIN (GLUCOPHAGE-XR) 500 MG 24 hr tablet    Sig: Take 2 tablets (1,000 mg total) by mouth 2 (two) times daily.    Dispense:  360 tablet    Refill:  3    Requesting 1 year supply   Medications Discontinued During This Encounter  Medication Reason   metFORMIN (GLUCOPHAGE-XR) 500 MG 24 hr tablet Duplicate   LEVEMIR FLEXPEN 100 UNIT/ML FlexPen Duplicate   TIRWERXVQMG,8.67 or 0.'5MG'$ /DOS, (OZEMPIC, 0.25 OR 0.5 MG/DOSE,) 2 MG/3ML SOPN    metFORMIN (GLUCOPHAGE) 500 MG tablet     Orders placed today for conditions managed today: Orders Placed This  Encounter  Procedures   POCT glycosylated hemoglobin (Hb A1C)    Disposition: Return in about 6 months (around 06/11/2022) for complete physical exam.  Dragon [employer] One speech-to-text software was used for transcription in this dictation.  Possible transcriptional errors can occur using Editor, commissioning.   Signed,  [name]. Margarette Vannatter, MD   Outpatient Encounter Medications as of 12/11/2021  Medication Sig   aspirin 81 MG tablet Take 81 mg by mouth daily.   atorvastatin (LIPITOR) 20 MG tablet TAKE 1 TABLET BY MOUTH ONCE  DAILY   glucose blood test strip 1 each by Other route 2 (two) times daily. Use as instructed - One Touch Verio   insulin detemir (LEVEMIR) 100  UNIT/ML injection Inject 40 Units into the skin 2 (two) times daily.   Insulin Pen Needle (BD PEN NEEDLE NANO U/F) 32G X 4 MM MISC USE ONE DAILY ALONG WITH YOUR INSULIN PEN   Lancets 28G MISC by Does not apply route as directed.   lisinopril-hydrochlorothiazide (ZESTORETIC) 20-25 MG tablet Take 1 tablet by mouth daily.   Multiple Vitamin (MULTIVITAMIN WITH MINERALS) TABS tablet Take 1 tablet by mouth daily.   Multiple Vitamins-Minerals (VISION FORMULA PO) Take 1 tablet by mouth daily.   Semaglutide, 1 MG/DOSE, 4 MG/3ML SOPN Inject 1 mg as directed once a week.   sildenafil (VIAGRA) 100 MG tablet Take 0.5-1 tablets (50-100 mg total) by mouth daily as needed for erectile dysfunction.   [DISCONTINUED] metFORMIN (GLUCOPHAGE) 500 MG tablet Take 1,000 mg by mouth 2 (two) times daily with a meal.   [DISCONTINUED] Semaglutide,0.25 or 0.'5MG'$ /DOS, (OZEMPIC, 0.25 OR 0.5 MG/DOSE,) 2 MG/3ML SOPN For 1 month, inject 0.25 mg subcutaneously once a week.  After one month increase to 0.5 mg injection once a week   metFORMIN (GLUCOPHAGE-XR) 500 MG 24 hr tablet Take 2 tablets (1,000 mg total) by mouth 2 (two) times daily.   [DISCONTINUED] LEVEMIR FLEXPEN 100 UNIT/ML FlexPen INJECT SUBCUTANEOUSLY 45 UNITS  TWICE DAILY (Patient taking differently: 40 Units 2 (two) times daily.)   [DISCONTINUED] metFORMIN (GLUCOPHAGE-XR) 500 MG 24 hr tablet Take 2 tablets (1,000 mg total) by mouth daily. (Patient taking differently: Take 1,000 mg by mouth 2 (two) times daily.)   Facility-Administered Encounter Medications as of 12/11/2021  Medication   0.9 %  sodium chloride infusion

## 2021-12-11 ENCOUNTER — Ambulatory Visit (INDEPENDENT_AMBULATORY_CARE_PROVIDER_SITE_OTHER): Payer: Medicare Other | Admitting: Family Medicine

## 2021-12-11 ENCOUNTER — Encounter: Payer: Self-pay | Admitting: Family Medicine

## 2021-12-11 VITALS — BP 100/70 | HR 83 | Temp 98.3°F | Ht 71.0 in | Wt 202.5 lb

## 2021-12-11 DIAGNOSIS — E1142 Type 2 diabetes mellitus with diabetic polyneuropathy: Secondary | ICD-10-CM

## 2021-12-11 DIAGNOSIS — Z794 Long term (current) use of insulin: Secondary | ICD-10-CM

## 2021-12-11 LAB — POCT GLYCOSYLATED HEMOGLOBIN (HGB A1C): Hemoglobin A1C: 7 % — AB (ref 4.0–5.6)

## 2021-12-11 MED ORDER — METFORMIN HCL ER 500 MG PO TB24: 1000.0000 mg | ORAL_TABLET | Freq: Two times a day (BID) | ORAL | 3 refills | Status: DC

## 2021-12-11 MED ORDER — SEMAGLUTIDE (1 MG/DOSE) 4 MG/3ML ~~LOC~~ SOPN: 1.0000 mg | PEN_INJECTOR | SUBCUTANEOUS | 3 refills | Status: DC

## 2021-12-14 NOTE — Progress Notes (Signed)
[facility] Quality Team Note  Name: Connor Moore Date of Birth: 10-05-1950 MRN: [id number] Date: 12/14/2021  [facility] Quality Team has reviewed this patient's chart, please see recommendations below:  [facility] Quality Other; Pt has open quality gap for KED, needs Micro/Creat Ratio urine test to close this. Please address at next visit.

## 2022-01-11 ENCOUNTER — Other Ambulatory Visit: Payer: Self-pay | Admitting: Family Medicine

## 2022-01-17 DIAGNOSIS — H35033 Hypertensive retinopathy, bilateral: Secondary | ICD-10-CM | POA: Diagnosis not present

## 2022-01-17 DIAGNOSIS — Z961 Presence of intraocular lens: Secondary | ICD-10-CM | POA: Diagnosis not present

## 2022-01-17 DIAGNOSIS — E113293 Type 2 diabetes mellitus with mild nonproliferative diabetic retinopathy without macular edema, bilateral: Secondary | ICD-10-CM | POA: Diagnosis not present

## 2022-01-17 LAB — HM DIABETES EYE EXAM

## 2022-01-24 ENCOUNTER — Telehealth: Payer: Self-pay | Admitting: Family Medicine

## 2022-01-24 NOTE — Telephone Encounter (Signed)
LVM for pt to rtn my call to schedule AWV with [facility] call back # [phone] 

## 2022-01-30 ENCOUNTER — Telehealth: Payer: Self-pay | Admitting: Family Medicine

## 2022-01-30 NOTE — Telephone Encounter (Signed)
LVM for pt to rtn my call to schedule AWV with [facility] call back # [phone] 

## 2022-02-21 ENCOUNTER — Other Ambulatory Visit: Payer: Self-pay | Admitting: Family Medicine

## 2022-03-22 ENCOUNTER — Telehealth: Payer: Self-pay

## 2022-03-22 NOTE — Progress Notes (Signed)
Care Management & Coordination Services Pharmacy Team  Reason for Encounter: Diabetes  Contacted patient to discuss diabetes disease state. Unsuccessful outreach. Left voicemail for patient to return call.  Current antihyperglycemic regimen:  Insulin Detemir 100 units - Inject 40 units into the skin daily Metformin 1,000 mg - 2 tablets two times daily  Semaglutide 1 mg - Inject 1 mg once a week  What recent interventions/DTPs have been made to improve glycemic control:  Increase Metformin to 1000 mg twice daily  Patient was given the ok to drop his insulin if he needs  Have there been any recent hospitalizations or ED visits since last visit with PharmD? No  Adherence Review: Is the patient currently on a STATIN medication? Yes Is the patient currently on ACE/ARB medication? Yes Does the patient have >5 day gap between last estimated fill dates? No  Chart Updates:  Recent office visits:  12/11/21 [name], MD DM A1c: 7.0 Change: Ozempic 1 mg F/U 6 months  Recent consult visits:  None since last contact  Hospital visits:  None in previous 6 months  Medications: Outpatient Encounter Medications as of 03/22/2022  Medication Sig   aspirin 81 MG tablet Take 81 mg by mouth daily.   atorvastatin (LIPITOR) 20 MG tablet TAKE 1 TABLET BY MOUTH ONCE  DAILY   glucose blood test strip 1 each by Other route 2 (two) times daily. Use as instructed - One Touch Verio   insulin detemir (LEVEMIR FLEXPEN) 100 UNIT/ML FlexPen Inject 40 Units into the skin 2 (two) times daily.   insulin detemir (LEVEMIR) 100 UNIT/ML injection Inject 40 Units into the skin 2 (two) times daily.   Insulin Pen Needle (BD PEN NEEDLE NANO U/F) 32G X 4 MM MISC USE ONE DAILY ALONG WITH YOUR INSULIN PEN   Lancets 28G MISC by Does not apply route as directed.   lisinopril-hydrochlorothiazide (ZESTORETIC) 20-25 MG tablet TAKE 1 TABLET BY MOUTH DAILY   metFORMIN (GLUCOPHAGE-XR) 500 MG 24 hr tablet Take 2 tablets  (1,000 mg total) by mouth 2 (two) times daily.   Multiple Vitamin (MULTIVITAMIN WITH MINERALS) TABS tablet Take 1 tablet by mouth daily.   Multiple Vitamins-Minerals (VISION FORMULA PO) Take 1 tablet by mouth daily.   Semaglutide, 1 MG/DOSE, 4 MG/3ML SOPN Inject 1 mg as directed once a week.   sildenafil (VIAGRA) 100 MG tablet Take 0.5-1 tablets (50-100 mg total) by mouth daily as needed for erectile dysfunction.   Facility-Administered Encounter Medications as of 03/22/2022  Medication   0.9 %  sodium chloride infusion    Recent Relevant Labs: Lab Results  Component Value Date/Time   HGBA1C 7.0 (A) 12/11/2021 09:08 AM   HGBA1C 11.2 (A) 08/07/2021 08:37 AM   HGBA1C 8.5 (H) 11/07/2020 09:02 AM   HGBA1C 9.0 (H) 09/28/2019 09:01 AM   MICROALBUR <0.7 09/28/2019 09:01 AM   MICROALBUR 0.9 09/24/2018 09:22 AM    Kidney Function Lab Results  Component Value Date/Time   CREATININE 0.75 04/23/2021 01:33 PM   CREATININE 0.86 11/07/2020 09:02 AM   GFR 88.91 11/07/2020 09:02 AM   GFRNONAA >60 04/23/2021 01:33 PM   GFRAA 100 03/29/2008 10:45 AM    Star Rating Drugs:  Medication:   Last Fill: Day Supply Atorvastatin 20 mg  01/18/2022 90 Lisinopril-HCTZ 20-25 mg 12/28/2021 90 Metformin 500 mg  03/12/2022 90 Ozempic 1 mg   03/12/2022 84 Verified with [employer]   Care Gaps: Annual wellness visit in last year? No 11/07/20 Last eye exam / retinopathy screening:  Up to date Last diabetic foot exam: Over due  Charlene Brooke, PharmD notified  Marijean Niemann, [place] Assistant [phone]

## 2022-04-22 NOTE — Progress Notes (Unsigned)
    Annabella Elford T. Kristyana Notte, MD, CAQ Sports Medicine [facility] at [facility] [address] [place], [address]  Phone: [phone]  FAX: [phone]  Connor Moore - 72 y.o. male  MRN [id number]  Date of Birth: 1950-01-21  Date: 04/23/2022  PCP: Hannah Beat, MD  Referral: Hannah Beat, MD  No chief complaint on file.  Subjective:   Connor Moore is a 72 y.o. very pleasant male patient with There is no height or weight on file to calculate BMI. who presents with the following:  She presents with ongoing low back pain.    Review of Systems is noted in the HPI, as appropriate  Objective:   There were no vitals taken for this visit.  GEN: No acute distress; alert,appropriate. PULM: Breathing comfortably in no respiratory distress PSYCH: Normally interactive.   Laboratory and Imaging Data:  Assessment and Plan:   ***

## 2022-04-23 ENCOUNTER — Ambulatory Visit (INDEPENDENT_AMBULATORY_CARE_PROVIDER_SITE_OTHER): Payer: Medicare Other | Admitting: Family Medicine

## 2022-04-23 VITALS — BP 140/76 | HR 78 | Temp 97.8°F | Ht 71.0 in | Wt 199.1 lb

## 2022-04-23 DIAGNOSIS — M545 Low back pain, unspecified: Secondary | ICD-10-CM | POA: Diagnosis not present

## 2022-04-23 MED ORDER — PREDNISONE 20 MG PO TABS: ORAL_TABLET | ORAL | 0 refills | Status: DC

## 2022-04-23 MED ORDER — TIZANIDINE HCL 4 MG PO TABS: 4.0000 mg | ORAL_TABLET | Freq: Every evening | ORAL | 1 refills | Status: DC | PRN

## 2022-05-16 ENCOUNTER — Encounter: Payer: Self-pay | Admitting: Family Medicine

## 2022-05-18 MED ORDER — OZEMPIC (0.25 OR 0.5 MG/DOSE) 2 MG/3ML ~~LOC~~ SOPN: 0.5000 mg | PEN_INJECTOR | SUBCUTANEOUS | 5 refills | Status: DC

## 2022-06-04 ENCOUNTER — Other Ambulatory Visit: Payer: Self-pay | Admitting: Family Medicine

## 2022-06-12 ENCOUNTER — Other Ambulatory Visit (INDEPENDENT_AMBULATORY_CARE_PROVIDER_SITE_OTHER): Payer: Medicare Other

## 2022-06-12 ENCOUNTER — Other Ambulatory Visit: Payer: Self-pay | Admitting: Family Medicine

## 2022-06-12 DIAGNOSIS — E1142 Type 2 diabetes mellitus with diabetic polyneuropathy: Secondary | ICD-10-CM

## 2022-06-12 DIAGNOSIS — Z125 Encounter for screening for malignant neoplasm of prostate: Secondary | ICD-10-CM

## 2022-06-12 DIAGNOSIS — Z79899 Other long term (current) drug therapy: Secondary | ICD-10-CM

## 2022-06-12 DIAGNOSIS — E782 Mixed hyperlipidemia: Secondary | ICD-10-CM

## 2022-06-12 DIAGNOSIS — Z794 Long term (current) use of insulin: Secondary | ICD-10-CM | POA: Diagnosis not present

## 2022-06-12 LAB — HEPATIC FUNCTION PANEL
ALT: 19 U/L (ref 0–53)
AST: 17 U/L (ref 0–37)
Albumin: 4.1 g/dL (ref 3.5–5.2)
Alkaline Phosphatase: 80 U/L (ref 39–117)
Bilirubin, Direct: 0.2 mg/dL (ref 0.0–0.3)
Total Bilirubin: 0.7 mg/dL (ref 0.2–1.2)
Total Protein: 6.5 g/dL (ref 6.0–8.3)

## 2022-06-12 LAB — CBC WITH DIFFERENTIAL/PLATELET
Basophils Absolute: 0.1 K/uL (ref 0.0–0.1)
Basophils Relative: 1 % (ref 0.0–3.0)
Eosinophils Absolute: 1.9 K/uL — ABNORMAL HIGH (ref 0.0–0.7)
Eosinophils Relative: 18.7 % — ABNORMAL HIGH (ref 0.0–5.0)
HCT: 43 % (ref 39.0–52.0)
Hemoglobin: 14.5 g/dL (ref 13.0–17.0)
Lymphocytes Relative: 27.7 % (ref 12.0–46.0)
Lymphs Abs: 2.7 K/uL (ref 0.7–4.0)
MCHC: 33.7 g/dL (ref 30.0–36.0)
MCV: 98 fl (ref 78.0–100.0)
Monocytes Absolute: 0.8 K/uL (ref 0.1–1.0)
Monocytes Relative: 7.7 % (ref 3.0–12.0)
Neutro Abs: 4.4 K/uL (ref 1.4–7.7)
Neutrophils Relative %: 44.9 % (ref 43.0–77.0)
Platelets: 233 K/uL (ref 150.0–400.0)
RBC: 4.39 Mil/uL (ref 4.22–5.81)
RDW: 13.5 % (ref 11.5–15.5)
WBC: 9.9 K/uL (ref 4.0–10.5)

## 2022-06-12 LAB — BASIC METABOLIC PANEL WITH GFR
BUN: 17 mg/dL (ref 6–23)
CO2: 27 meq/L (ref 19–32)
Calcium: 9.4 mg/dL (ref 8.4–10.5)
Chloride: 103 meq/L (ref 96–112)
Creatinine, Ser: 0.88 mg/dL (ref 0.40–1.50)
GFR: 87.31 mL/min
Glucose, Bld: 114 mg/dL — ABNORMAL HIGH (ref 70–99)
Potassium: 4.7 meq/L (ref 3.5–5.1)
Sodium: 138 meq/L (ref 135–145)

## 2022-06-12 LAB — LIPID PANEL
Cholesterol: 91 mg/dL (ref 0–200)
HDL: 34.6 mg/dL — ABNORMAL LOW
LDL Cholesterol: 42 mg/dL (ref 0–99)
NonHDL: 56.15
Total CHOL/HDL Ratio: 3
Triglycerides: 72 mg/dL (ref 0.0–149.0)
VLDL: 14.4 mg/dL (ref 0.0–40.0)

## 2022-06-12 LAB — HEMOGLOBIN A1C: Hgb A1c MFr Bld: 7.2 % — ABNORMAL HIGH (ref 4.6–6.5)

## 2022-06-14 LAB — PSA, TOTAL WITH REFLEX TO PSA, FREE: PSA, Total: 2.9 ng/mL (ref <=4.0)

## 2022-06-17 ENCOUNTER — Encounter: Payer: Self-pay | Admitting: Family Medicine

## 2022-06-17 NOTE — Progress Notes (Unsigned)
Maleena Eddleman T. Seraphim Affinito, MD, CAQ Sports Medicine [facility] at [facility] [address] [place], [address]  Phone: [phone]  FAX: [phone]  Connor Moore - 72 y.o. male  MRN [id number]  Date of Birth: 25-May-1950  Date: 06/18/2022  PCP: Hannah Beat, MD  Referral: Hannah Beat, MD  No chief complaint on file.  Patient Care Team: Hannah Beat, MD as PCP - General Jodelle Red, MD as PCP - Cardiology (Cardiology) Vilinda Flake, [facility] as Pharmacist (Pharmacist) Subjective:   Connor Moore is a 72 y.o. pleasant patient who presents for a medicare wellness examination:  Preventative Health Maintenance Visit:  Health Maintenance Summary Reviewed and updated, unless pt declines services.  Tobacco History Reviewed. Alcohol: No concerns, no excessive use Exercise Habits: Some activity, rec at least 30 mins 5 times a week STD concerns: no risk or activity to increase risk Drug Use: None  Shingrix Lung cancer scr Colonoscopy Foot exam  Health Maintenance  Topic Date Due   Zoster Vaccines- Shingrix (1 of 2) Never done   Lung Cancer Screening  Never done   Colonoscopy  06/19/2019   Diabetic kidney evaluation - Urine ACR  09/27/2020   COVID-19 Vaccine (5 - 2023-24 season) 09/15/2021   FOOT EXAM  11/07/2021   Medicare Annual Wellness (AWV)  11/07/2021   Pneumonia Vaccine 55+ Years old (1 of 2 - PCV) 12/12/2022 (Originally 03/31/1956)   INFLUENZA VACCINE  08/16/2022   HEMOGLOBIN A1C  12/13/2022   OPHTHALMOLOGY EXAM  01/18/2023   Diabetic kidney evaluation - eGFR measurement  06/12/2023   DTaP/Tdap/Td (2 - Td or Tdap) 03/30/2025   Hepatitis C Screening  Completed   HPV VACCINES  Aged Out    Immunization History  Administered Date(s) Administered   PFIZER Comirnaty(Gray Top)Covid-19 Tri-Sucrose Vaccine 08/11/2020   PFIZER(Purple Top)SARS-COV-2 Vaccination 02/21/2019, 03/14/2019, 09/14/2019   Tdap 03/31/2015     Patient Active Problem List   Diagnosis Date Noted   Controlled type 2 diabetes mellitus with diabetic polyneuropathy, with long-term current use of insulin (HCC) 04/01/2008    Priority: High   Diabetic polyneuropathy associated with type 2 diabetes mellitus (HCC) 12/07/2010    Priority: Medium    Hyperlipidemia 02/12/2008    Priority: Medium    Erectile dysfunction associated with type 2 diabetes mellitus (HCC) 02/12/2008    Priority: Medium    Essential hypertension 02/12/2008    Priority: Medium    One Testicle, 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 03/10/2013   Diverticulosis    Colon polyps    TOBACCO USE 05/19/2009    Past Medical History:  Diagnosis Date   Colon polyps    Diabetes mellitus type II    Diabetic neuropathy (HCC) 12/07/2010   Diverticulosis    ED (erectile dysfunction)    rare   HLD (hyperlipidemia)    HTN (hypertension)    Obesity     Past Surgical History:  Procedure Laterality Date   APPENDECTOMY     INGUINAL HERNIA REPAIR  1974   KNEE ARTHROSCOPY  1986   Right   Left Hydrocele surgery  1974   REPLACEMENT UNICONDYLAR JOINT KNEE  2009   TESTICLE REMOVAL  1974   Left   TONSILLECTOMY     VASECTOMY  1981    Family History  Problem Relation Age of Onset   Diabetes Father    Cancer Father        mouth   Mental illness Mother        ?  Diabetes Sister    Colon cancer Neg Hx    Esophageal cancer Neg Hx    Rectal cancer Neg Hx    Stomach cancer Neg Hx     Social History   Social History Narrative   Not on file    Past Medical History, Surgical History, Social History, Family History, Problem List, Medications, and Allergies have been reviewed and updated if relevant.  Review of Systems: Pertinent positives are listed above.  Otherwise, a full 14 point review of systems has been done in full and it is negative except where it is noted positive.  Objective:   There were no vitals taken for this visit.    09/24/2018     8:27 AM 09/28/2019    8:24 AM 11/07/2020    8:21 AM 04/23/2021   11:07 AM 04/23/2022    9:17 AM  Fall Risk  Falls in the past year? 0 0 0  0  Was there an injury with Fall?     0  Fall Risk Category Calculator     0  (RETIRED) Patient Fall Risk Level    Low fall risk   Patient at Risk for Falls Due to     No Fall Risks  Fall risk Follow up     Falls evaluation completed   Ideal Body Weight:   No results found.    04/23/2022    9:17 AM 11/07/2020    8:21 AM 09/28/2019    8:24 AM 09/24/2018    8:28 AM 04/01/2017   10:39 AM  Depression screen PHQ 2/9  Decreased Interest 0 0 0 0 0  Down, Depressed, Hopeless 0 0 0 0 0  PHQ - 2 Score 0 0 0 0 0     GEN: well developed, well nourished, no acute distress Eyes: conjunctiva and lids normal, PERRLA, EOMI ENT: TM clear, nares clear, oral exam WNL Neck: supple, no lymphadenopathy, no thyromegaly, no JVD Pulm: clear to auscultation and percussion, respiratory effort normal CV: regular rate and rhythm, S1-S2, no murmur, rub or gallop, no bruits, peripheral pulses normal and symmetric, no cyanosis, clubbing, edema or varicosities GI: soft, non-tender; no hepatosplenomegaly, masses; active bowel sounds all quadrants GU: deferred Lymph: no cervical, axillary or inguinal adenopathy MSK: gait normal, muscle tone and strength WNL, no joint swelling, effusions, discoloration, crepitus  SKIN: clear, good turgor, color WNL, no rashes, lesions, or ulcerations Neuro: normal mental status, normal strength, sensation, and motion Psych: alert; oriented to person, place and time, normally interactive and not anxious or depressed in appearance.  All labs reviewed with patient.  Results for orders placed or performed in visit on 06/12/22  PSA, Total with Reflex to PSA, Free  Result Value Ref Range   PSA, Total 2.9 < OR = 4.0 ng/mL  Lipid panel  Result Value Ref Range   Cholesterol 91 0 - 200 mg/dL   Triglycerides 40.9 0.0 - 149.0 mg/dL   HDL 81.19 (L)  >14.78 mg/dL   VLDL 29.5 0.0 - 62.1 mg/dL   LDL Cholesterol 42 0 - 99 mg/dL   Total CHOL/HDL Ratio 3    NonHDL 56.15   Hemoglobin A1c  Result Value Ref Range   Hgb A1c MFr Bld 7.2 (H) 4.6 - 6.5 %  Hepatic function panel  Result Value Ref Range   Total Bilirubin 0.7 0.2 - 1.2 mg/dL   Bilirubin, Direct 0.2 0.0 - 0.3 mg/dL   Alkaline Phosphatase 80 39 - 117 U/L   AST 17 0 -  37 U/L   ALT 19 0 - 53 U/L   Total Protein 6.5 6.0 - 8.3 g/dL   Albumin 4.1 3.5 - 5.2 g/dL  CBC with Differential/Platelet  Result Value Ref Range   WBC 9.9 4.0 - 10.5 K/uL   RBC 4.39 4.22 - 5.81 Mil/uL   Hemoglobin 14.5 13.0 - 17.0 g/dL   HCT 16.1 09.6 - 04.5 %   MCV 98.0 78.0 - 100.0 fl   MCHC 33.7 30.0 - 36.0 g/dL   RDW 40.9 81.1 - 91.4 %   Platelets 233.0 150.0 - 400.0 K/uL   Neutrophils Relative % 44.9 43.0 - 77.0 %   Lymphocytes Relative 27.7 12.0 - 46.0 %   Monocytes Relative 7.7 3.0 - 12.0 %   Eosinophils Relative 18.7 Repeated and verified X2. (H) 0.0 - 5.0 %   Basophils Relative 1.0 0.0 - 3.0 %   Neutro Abs 4.4 1.4 - 7.7 K/uL   Lymphs Abs 2.7 0.7 - 4.0 K/uL   Monocytes Absolute 0.8 0.1 - 1.0 K/uL   Eosinophils Absolute 1.9 (H) 0.0 - 0.7 K/uL   Basophils Absolute 0.1 0.0 - 0.1 K/uL  Basic metabolic panel  Result Value Ref Range   Sodium 138 135 - 145 mEq/L   Potassium 4.7 3.5 - 5.1 mEq/L   Chloride 103 96 - 112 mEq/L   CO2 27 19 - 32 mEq/L   Glucose, Bld 114 (H) 70 - 99 mg/dL   BUN 17 6 - 23 mg/dL   Creatinine, Ser 7.82 0.40 - 1.50 mg/dL   GFR 95.62 >13.08 mL/min   Calcium 9.4 8.4 - 10.5 mg/dL    Assessment and Plan:     ICD-10-CM   1. Healthcare maintenance  Z00.00       Health Maintenance Exam: The patient's preventative maintenance and recommended screening tests for an annual wellness exam were reviewed in full today. Brought up to date unless services declined.  Counselled on the importance of diet, exercise, and its role in overall health and mortality. The patient's FH and  SH was reviewed, including their home life, tobacco status, and drug and alcohol status.  Follow-up in 1 year for physical exam or additional follow-up below.  I have personally reviewed the Medicare Annual Wellness questionnaire and have noted 1. The patient's medical and social history 2. Their use of alcohol, tobacco or illicit drugs 3. Their current medications and supplements 4. The patient's functional ability including ADL's, fall risks, home safety risks and hearing or visual             impairment. 5. Diet and physical activities 6. Evidence for depression or mood disorders 7. Reviewed Updated provider list, see scanned forms and [facility] Snapshot.  8. Reviewed whether or not the patient has HCPOA or living will, and discussed what this means with the patient.  Recommended he bring in a copy for his chart in [facility].  The patients weight, height, BMI and visual acuity have been recorded in the chart I have made referrals, counseling and provided education to the patient based review of the above and I have provided the pt with a written personalized care plan for preventive services.  I have provided the patient with a copy of your personalized plan for preventive services. Instructed to take the time to review along with their updated medication list.  Disposition: No follow-ups on file.  Future Appointments  Date Time Provider Department Center  06/18/2022  8:20 AM Tali Coster, Karleen Hampshire, MD [facility]  No orders of the defined types were placed in this encounter.  Medications Discontinued During This Encounter  Medication Reason   predniSONE (DELTASONE) 20 MG tablet    tiZANidine (ZANAFLEX) 4 MG tablet    No orders of the defined types were placed in this encounter.   Signed,  Elpidio Galea. Liahm Grivas, MD   Allergies as of 06/18/2022   No Known Allergies      Medication List        Accurate as of June 17, 2022 12:21 PM. If you have any questions, ask your nurse or doctor.           STOP taking these medications    predniSONE 20 MG tablet Commonly known as: DELTASONE   tiZANidine 4 MG tablet Commonly known as: ZANAFLEX       TAKE these medications    aspirin 81 MG tablet Take 81 mg by mouth daily.   atorvastatin 20 MG tablet Commonly known as: LIPITOR TAKE 1 TABLET BY MOUTH ONCE  DAILY   BD Pen Needle Nano U/F 32G X 4 MM Misc Generic drug: Insulin Pen Needle USE ONE DAILY ALONG WITH YOUR INSULIN PEN   glucose blood test strip 1 each by Other route 2 (two) times daily. Use as instructed - One Touch Verio   Lancets 28G Misc by Does not apply route as directed.   insulin detemir 100 UNIT/ML injection Commonly known as: LEVEMIR Inject 30 Units into the skin 2 (two) times daily. What changed: Another medication with the same name was changed. Make sure you understand how and when to take each.   Levemir FlexPen 100 UNIT/ML FlexPen Generic drug: insulin detemir Inject 40 Units into the skin 2 (two) times daily. What changed: how much to take   lisinopril-hydrochlorothiazide 20-25 MG tablet Commonly known as: ZESTORETIC TAKE 1 TABLET BY MOUTH DAILY   metFORMIN 500 MG 24 hr tablet Commonly known as: GLUCOPHAGE-XR Take 2 tablets (1,000 mg total) by mouth 2 (two) times daily. What changed: when to take this   multivitamin with minerals Tabs tablet Take 1 tablet by mouth daily.   Ozempic (0.25 or 0.5 MG/DOSE) 2 MG/3ML Sopn Generic drug: Semaglutide(0.25 or 0.5MG /DOS) Inject 0.5 mg into the skin once a week.   sildenafil 100 MG tablet Commonly known as: Viagra Take 0.5-1 tablets (50-100 mg total) by mouth daily as needed for erectile dysfunction.   VISION FORMULA PO Take 1 tablet by mouth daily.

## 2022-06-18 ENCOUNTER — Encounter: Payer: Self-pay | Admitting: Family Medicine

## 2022-06-18 ENCOUNTER — Ambulatory Visit (INDEPENDENT_AMBULATORY_CARE_PROVIDER_SITE_OTHER): Payer: Medicare Other | Admitting: Family Medicine

## 2022-06-18 VITALS — BP 146/82 | HR 70 | Temp 97.6°F | Ht 70.5 in | Wt 197.1 lb

## 2022-06-18 DIAGNOSIS — Z0000 Encounter for general adult medical examination without abnormal findings: Secondary | ICD-10-CM | POA: Diagnosis not present

## 2022-06-18 DIAGNOSIS — Z122 Encounter for screening for malignant neoplasm of respiratory organs: Secondary | ICD-10-CM

## 2022-06-18 DIAGNOSIS — Z1211 Encounter for screening for malignant neoplasm of colon: Secondary | ICD-10-CM | POA: Diagnosis not present

## 2022-06-18 MED ORDER — OZEMPIC (0.25 OR 0.5 MG/DOSE) 2 MG/3ML ~~LOC~~ SOPN: 0.5000 mg | PEN_INJECTOR | SUBCUTANEOUS | 3 refills | Status: DC

## 2022-06-27 ENCOUNTER — Encounter: Payer: Self-pay | Admitting: Gastroenterology

## 2022-07-20 ENCOUNTER — Encounter: Payer: Self-pay | Admitting: Gastroenterology

## 2022-07-20 ENCOUNTER — Ambulatory Visit (AMBULATORY_SURGERY_CENTER): Payer: Medicare Other | Admitting: *Deleted

## 2022-07-20 VITALS — Ht 71.0 in | Wt 195.0 lb

## 2022-07-20 DIAGNOSIS — Z86010 Personal history of colonic polyps: Secondary | ICD-10-CM

## 2022-07-20 MED ORDER — NA SULFATE-K SULFATE-MG SULF 17.5-3.13-1.6 GM/177ML PO SOLN: 1.0000 | Freq: Once | ORAL | 0 refills | Status: AC

## 2022-07-20 NOTE — Progress Notes (Signed)
Pt's name and DOB verified at the beginning of the pre-visit.  Pt denies any difficulty with ambulating,sitting, laying down or rolling side to side Gave both [facility] main # and MD on call # prior to instructions.  No egg or soy allergy known to patient  No issues known to pt with past sedation with any surgeries or procedures Pt denies having issues being intubated Pt has no issues moving head neck or swallowing No FH of Malignant Hyperthermia Pt is  on diet Ozempic Pt is not on home 02  Pt is not on blood thinners  Pt denies issues with constipation  Pt is not on dialysis Pt denise any abnormal heart rhythms  Pt denies any upcoming cardiac testing Pt encouraged to use to use Singlecare or Goodrx to reduce cost  Patient's chart reviewed by Cathlyn Parsons CNRA prior to pre-visit and patient appropriate for the [facility].  Pre-visit completed and red dot placed by patient's name on their procedure day (on provider's schedule).  . Visit by phone Pt states weight is 195 lb Instructed pt why it is important to and  to call if they have any changes in health or new medications. Directed them to the # given and on instructions.   Pt states they will.  Instructions reviewed with pt and pt states understanding. Instructed to review again prior to procedure. Pt states they will.  Instructions sent by mail with coupon and by my chart

## 2022-07-27 ENCOUNTER — Other Ambulatory Visit: Payer: Self-pay | Admitting: Family Medicine

## 2022-08-01 ENCOUNTER — Other Ambulatory Visit: Payer: Self-pay | Admitting: Family Medicine

## 2022-08-08 ENCOUNTER — Ambulatory Visit (AMBULATORY_SURGERY_CENTER): Payer: Medicare Other | Admitting: Gastroenterology

## 2022-08-08 ENCOUNTER — Encounter: Payer: Self-pay | Admitting: Gastroenterology

## 2022-08-08 VITALS — BP 121/74 | HR 70 | Temp 97.1°F | Resp 18 | Ht 71.0 in | Wt 195.0 lb

## 2022-08-08 DIAGNOSIS — Z09 Encounter for follow-up examination after completed treatment for conditions other than malignant neoplasm: Secondary | ICD-10-CM | POA: Diagnosis not present

## 2022-08-08 DIAGNOSIS — D125 Benign neoplasm of sigmoid colon: Secondary | ICD-10-CM

## 2022-08-08 DIAGNOSIS — I10 Essential (primary) hypertension: Secondary | ICD-10-CM | POA: Diagnosis not present

## 2022-08-08 DIAGNOSIS — D123 Benign neoplasm of transverse colon: Secondary | ICD-10-CM

## 2022-08-08 DIAGNOSIS — K635 Polyp of colon: Secondary | ICD-10-CM | POA: Diagnosis not present

## 2022-08-08 DIAGNOSIS — Z86010 Personal history of colonic polyps: Secondary | ICD-10-CM

## 2022-08-08 MED ORDER — SODIUM CHLORIDE 0.9 % IV SOLN: 500.0000 mL | Freq: Once | INTRAVENOUS | Status: DC

## 2022-08-08 NOTE — Progress Notes (Signed)
History and Physical:  This patient presents for endoscopic testing for: Encounter Diagnosis  Name Primary?   Personal history of colonic polyps Yes    Surveillance colonoscopy today  June 2018 (Dr Christella Hartigan) - 4 and 11mm TA (rectal with focal HGD).    < 10mm TA in 2010 Patient denies chronic abdominal pain, rectal bleeding, constipation or diarrhea.  Patient is otherwise without complaints or active issues today.   Past Medical History: Past Medical History:  Diagnosis Date   Cataract    removed   Colon polyps    Diabetes mellitus type II    Diabetic neuropathy (HCC) 12/07/2010   Diverticulosis    ED (erectile dysfunction)    HLD (hyperlipidemia)    HTN (hypertension)    Obesity      Past Surgical History: Past Surgical History:  Procedure Laterality Date   APPENDECTOMY     INGUINAL HERNIA REPAIR  1974   KNEE ARTHROSCOPY  1986   Right   Left Hydrocele surgery  1974   REPLACEMENT UNICONDYLAR JOINT KNEE  2009   TESTICLE REMOVAL  1974   Left   TONSILLECTOMY     VASECTOMY  1981    Allergies: No Known Allergies  Outpatient Meds: Current Outpatient Medications  Medication Sig Dispense Refill   aspirin 81 MG tablet Take 81 mg by mouth daily.     atorvastatin (LIPITOR) 20 MG tablet TAKE 1 TABLET BY MOUTH ONCE  DAILY 90 tablet 1   folic acid (FOLVITE) 800 MCG tablet Take 400 mcg by mouth daily.     glucose blood test strip 1 each by Other route 2 (two) times daily. Use as instructed - One Touch Verio     insulin detemir (LEVEMIR FLEXPEN) 100 UNIT/ML FlexPen Inject 40 Units into the skin 2 (two) times daily. (Patient taking differently: Inject 35 Units into the skin 2 (two) times daily.) 75 mL 3   Insulin Pen Needle (BD PEN NEEDLE NANO U/F) 32G X 4 MM MISC USE ONE DAILY ALONG WITH YOUR INSULIN PEN 100 each 3   Lancets 28G MISC by Does not apply route as directed.     lisinopril-hydrochlorothiazide (ZESTORETIC) 20-25 MG tablet TAKE 1 TABLET BY MOUTH DAILY 90 tablet 3    metFORMIN (GLUCOPHAGE-XR) 500 MG 24 hr tablet Take 2 tablets (1,000 mg total) by mouth 2 (two) times daily. (Patient taking differently: Take 1,000 mg by mouth daily with breakfast.) 360 tablet 3   Multiple Vitamin (MULTIVITAMIN WITH MINERALS) TABS tablet Take 1 tablet by mouth daily.     Multiple Vitamins-Minerals (VISION FORMULA PO) Take 1 tablet by mouth daily.     OVER THE COUNTER MEDICATION Take 1,000 mg by mouth daily at 12 noon. Takes [facility]     OZEMPIC, 1 MG/DOSE, 4 MG/3ML SOPN Inject into the skin.     Semaglutide,0.25 or 0.5MG /DOS, (OZEMPIC, 0.25 OR 0.5 MG/DOSE,) 2 MG/3ML SOPN Inject 0.5 mg into the skin once a week. 9 mL 3   sildenafil (VIAGRA) 100 MG tablet TAKE 1/2 TO 1 TABLET BY  MOUTH DAILY AS NEEDED FOR  ERECTILE DYSFUNCTION 12 tablet 3   Current Facility-Administered Medications  Medication Dose Route Frequency Provider Last Rate Last Admin   0.9 %  sodium chloride infusion  500 mL Intravenous Once Charlie Pitter III, MD          ___________________________________________________________________ Objective   Exam:  BP (!) 143/72 (BP Location: Left Arm, Patient Position: Sitting, Cuff Size: Normal)   Pulse 65  Temp (!) 97.1 F (36.2 C) (Temporal)   Ht 5\' 11"  (1.803 m)   Wt 195 lb (88.5 kg)   SpO2 100%   BMI 27.20 kg/m   CV: regular , S1/S2 Resp: clear to auscultation bilaterally, normal RR and effort noted GI: soft, no tenderness, with active bowel sounds.   Assessment: Encounter Diagnosis  Name Primary?   Personal history of colonic polyps Yes     Plan: Colonoscopy   The benefits and risks of the planned procedure were described in detail with the patient or (when appropriate) their health care proxy.  Risks were outlined as including, but not limited to, bleeding, infection, perforation, adverse medication reaction leading to cardiac or pulmonary decompensation, pancreatitis (if ERCP).  The limitation of incomplete mucosal visualization was also  discussed.  No guarantees or warranties were given.  The patient is appropriate for an endoscopic procedure in the ambulatory setting.   - Amada Jupiter, MD

## 2022-08-08 NOTE — Progress Notes (Signed)
Vitals-Dt ? ?Pt's states no medical or surgical changes since previsit or office visit.  ?

## 2022-08-08 NOTE — Patient Instructions (Signed)
Resume previous diet and medications. Awaiting pathology results. Repeat Colonoscopy date to be determined based on pathology results.  Handout provided on:Colon polyps and Hemorrhoids  YOU HAD AN ENDOSCOPIC PROCEDURE TODAY AT THE [facility]:   Refer to the procedure report that was given to you for any specific questions about what was found during the examination.  If the procedure report does not answer your questions, please call your gastroenterologist to clarify.  If you requested that your care partner not be given the details of your procedure findings, then the procedure report has been included in a sealed envelope for you to review at your convenience later.  YOU SHOULD EXPECT: Some feelings of bloating in the abdomen. Passage of more gas than usual.  Walking can help get rid of the air that was put into your GI tract during the procedure and reduce the bloating. If you had a lower endoscopy (such as a colonoscopy or flexible sigmoidoscopy) you may notice spotting of blood in your stool or on the toilet paper. If you underwent a bowel prep for your procedure, you may not have a normal bowel movement for a few days.  Please Note:  You might notice some irritation and congestion in your nose or some drainage.  This is from the oxygen used during your procedure.  There is no need for concern and it should clear up in a day or so.  SYMPTOMS TO REPORT IMMEDIATELY:  Following lower endoscopy (colonoscopy or flexible sigmoidoscopy):  Excessive amounts of blood in the stool  Significant tenderness or worsening of abdominal pains  Swelling of the abdomen that is new, acute  Fever of 100F or higher  For urgent or emergent issues, a gastroenterologist can be reached at any hour by calling [phone]. Do not use MyChart messaging for urgent concerns.    DIET:  We do recommend a small meal at first, but then you may proceed to your regular diet.  Drink plenty of fluids but you  should avoid alcoholic beverages for 24 hours.  ACTIVITY:  You should plan to take it easy for the rest of today and you should NOT DRIVE or use heavy machinery until tomorrow (because of the sedation medicines used during the test).    FOLLOW UP: Our staff will call the number listed on your records the next business day following your procedure.  We will call around 7:15- 8:00 am to check on you and address any questions or concerns that you may have regarding the information given to you following your procedure. If we do not reach you, we will leave a message.     If any biopsies were taken you will be contacted by phone or by letter within the next 1-3 weeks.  Please call us at [phone] if you have not heard about the biopsies in 3 weeks.    SIGNATURES/CONFIDENTIALITY: You and/or your care partner have signed paperwork which will be entered into your electronic medical record.  These signatures attest to the fact that that the information above on your After Visit Summary has been reviewed and is understood.  Full responsibility of the confidentiality of this discharge information lies with you and/or your care-partner. 

## 2022-08-08 NOTE — Progress Notes (Signed)
Uneventful anesthetic. Report to pacu rn. Vss. Care resumed by rn. 

## 2022-08-08 NOTE — Op Note (Signed)
[facility] Patient Name: Connor Moore Procedure Date: 08/08/2022 9:23 AM MRN: [id number] Endoscopist: Sherilyn Cooter L. Myrtie Neither , MD, [id number] Age: 72 Referring MD:  Date of Birth: Apr 07, 1950 Gender: Male Account #: [id number] Procedure:                Colonoscopy Indications:              Surveillance: Personal history of adenomatous                            polyps on last colonoscopy 5 years ago                           June 2018 (Dr. Christella Hartigan) 4 mm tubular adenoma and 11                            mm rectal tubular adenoma with focal high-grade                            dysplasia                           Subcentimeter TA 2010 Medicines:                Monitored Anesthesia Care Procedure:                Pre-Anesthesia Assessment:                           - Prior to the procedure, a History and Physical                            was performed, and patient medications and                            allergies were reviewed. The patient's tolerance of                            previous anesthesia was also reviewed. The risks                            and benefits of the procedure and the sedation                            options and risks were discussed with the patient.                            All questions were answered, and informed consent                            was obtained. Prior Anticoagulants: The patient has                            taken no anticoagulant or antiplatelet agents. ASA  Grade Assessment: II - A patient with mild systemic                            disease. After reviewing the risks and benefits,                            the patient was deemed in satisfactory condition to                            undergo the procedure.                           After obtaining informed consent, the colonoscope                            was passed under direct vision. Throughout the                            procedure, the  patient's blood pressure, pulse, and                            oxygen saturations were monitored continuously. The                            CF [id number] #[id number] was introduced through the anus                            and advanced to the the terminal ileum, with                            identification of the appendiceal orifice and IC                            valve. The colonoscopy was somewhat difficult due                            to a redundant colon. Successful completion of the                            procedure was aided by using manual pressure and                            straightening and shortening the scope to obtain                            bowel loop reduction. The patient tolerated the                            procedure well. The quality of the bowel                            preparation was initially fair in most areas with  semiopaque liquid and MiraLAX debris, improved to                            good with lavage. The ileocecal valve, appendiceal                            orifice, and rectum were photographed. The bowel                            preparation used was 2 day Suprep/Miralax. Scope In: 9:37:41 AM Scope Out: 10:04:22 AM Scope Withdrawal Time: 0 hours 18 minutes 26 seconds  Total Procedure Duration: 0 hours 26 minutes 41 seconds  Findings:                 The perianal and digital rectal examinations were                            normal.                           The terminal ileum appeared normal.                           Repeat examination of right colon under NBI                            performed.                           A diminutive polyp was found in the proximal                            transverse colon. The polyp was semi-sessile. The                            polyp was removed with a cold biopsy forceps.                            Resection and retrieval were complete.                           A  diminutive polyp was found in the transverse                            colon. The polyp was sessile. The polyp was removed                            with a cold snare. Resection and retrieval were                            complete.                           A diminutive polyp was found in the sigmoid colon.  The polyp was semi-sessile. The polyp was removed                            with a cold snare. Resection and retrieval were                            complete. (All polyps in same pathology jar)                           Internal hemorrhoids were found. The hemorrhoids                            were small. Complications:            No immediate complications. Estimated Blood Loss:     Estimated blood loss was minimal. Impression:               - The examined portion of the ileum was normal.                           - One diminutive polyp in the proximal transverse                            colon, removed with a cold biopsy forceps. Resected                            and retrieved.                           - One diminutive polyp in the transverse colon,                            removed with a cold snare. Resected and retrieved.                           - One diminutive polyp in the sigmoid colon,                            removed with a cold snare. Resected and retrieved.                           - Internal hemorrhoids. Recommendation:           - Patient has a contact number available for                            emergencies. The signs and symptoms of potential                            delayed complications were discussed with the                            patient. Return to normal activities tomorrow.  Written discharge instructions were provided to the                            patient.                           - Resume previous diet.                           - Continue present medications.                            - Await pathology results.                           - Repeat colonoscopy is recommended for                            surveillance. The colonoscopy date will be                            determined after pathology results from today's                            exam become available for review. ( GoLytely prep                            for next colonoscopy) Sherilyn Cooter L. Myrtie Neither, MD 08/08/2022 10:11:22 AM This report has been signed electronically.

## 2022-08-08 NOTE — Progress Notes (Signed)
Called to room to assist during endoscopic procedure.  Patient ID and intended procedure confirmed with present staff. Received instructions for my participation in the procedure from the performing physician.  

## 2022-08-09 ENCOUNTER — Telehealth: Payer: Self-pay | Admitting: *Deleted

## 2022-08-09 NOTE — Telephone Encounter (Signed)
Attempted to call patient for their post-procedure follow-up call. No answer. Left voicemail.   

## 2022-08-22 ENCOUNTER — Encounter: Payer: Self-pay | Admitting: Gastroenterology

## 2022-11-01 ENCOUNTER — Other Ambulatory Visit: Payer: Self-pay | Admitting: Family Medicine

## 2022-11-07 ENCOUNTER — Encounter: Payer: Self-pay | Admitting: Family Medicine

## 2022-11-15 ENCOUNTER — Other Ambulatory Visit: Payer: Self-pay | Admitting: Family Medicine

## 2022-11-28 ENCOUNTER — Telehealth: Payer: Self-pay | Admitting: Family Medicine

## 2022-11-28 NOTE — Telephone Encounter (Signed)
Left message for [name] to return my call.

## 2022-11-28 NOTE — Telephone Encounter (Signed)
Can you call -   How much Levemir is he taking?  I got paperwork to help switch him to Lantus for his insurance.  We will need to use a slightly lower dose than his current Levemir dose.

## 2022-11-29 MED ORDER — LANTUS SOLOSTAR 100 UNIT/ML ~~LOC~~ SOPN: 30.0000 [IU] | PEN_INJECTOR | Freq: Two times a day (BID) | SUBCUTANEOUS | 5 refills | Status: DC

## 2022-11-29 NOTE — Addendum Note (Signed)
Addended by: Hannah Beat on: 11/29/2022 09:14 AM   Modules accepted: Orders

## 2022-11-29 NOTE — Telephone Encounter (Signed)
Per MyChart response:    40 when I wake up and 40 when I go to bed

## 2022-12-19 ENCOUNTER — Ambulatory Visit: Payer: Medicare Other | Admitting: Family Medicine

## 2022-12-22 ENCOUNTER — Other Ambulatory Visit (HOSPITAL_COMMUNITY): Payer: Self-pay

## 2022-12-25 ENCOUNTER — Encounter: Payer: Self-pay | Admitting: Family Medicine

## 2023-01-03 IMAGING — US US ABDOMEN LIMITED
1 series · 14 of 25 positions shown · non-contrast
Comparison: None.

CLINICAL DATA: Right upper quadrant pain and nausea

EXAM:
ULTRASOUND ABDOMEN LIMITED RIGHT UPPER QUADRANT

[Series 1: us abdomen limited ruq (liver/gb) · 14 of 49 slices shown]
[im 1/49]
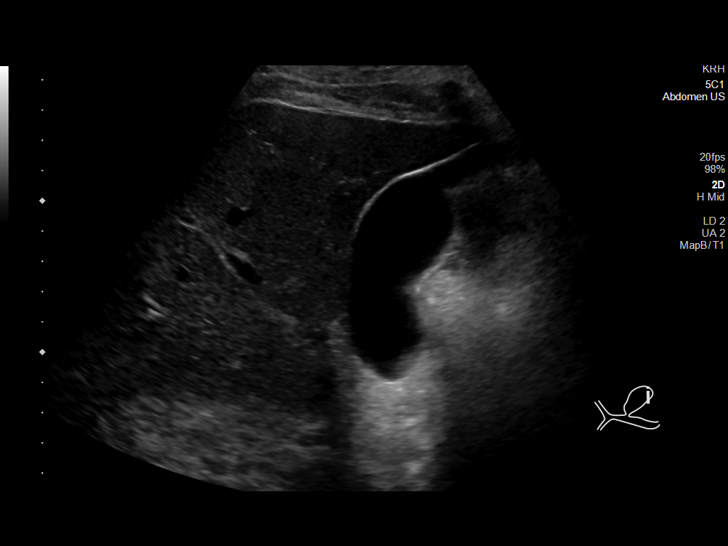
[im 5/49]
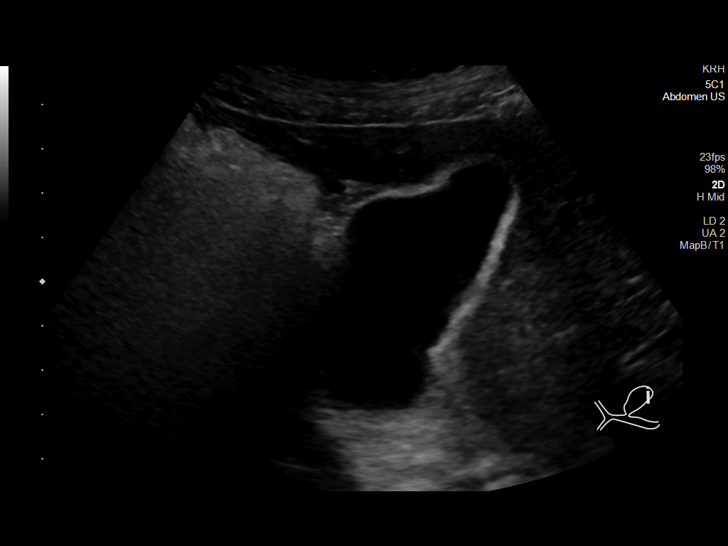
[im 9/49]
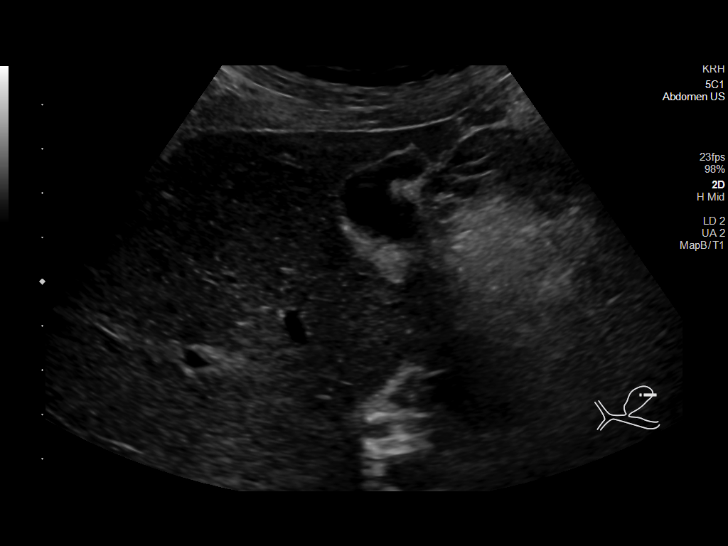
[im 13/49]
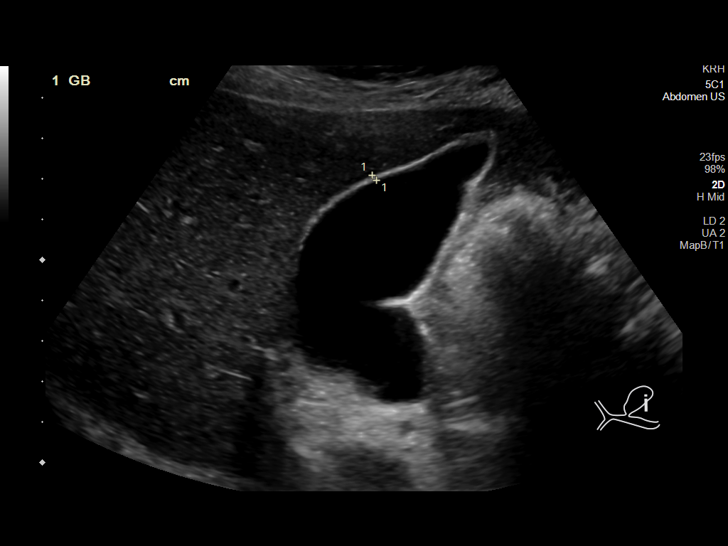
[im 17/49]
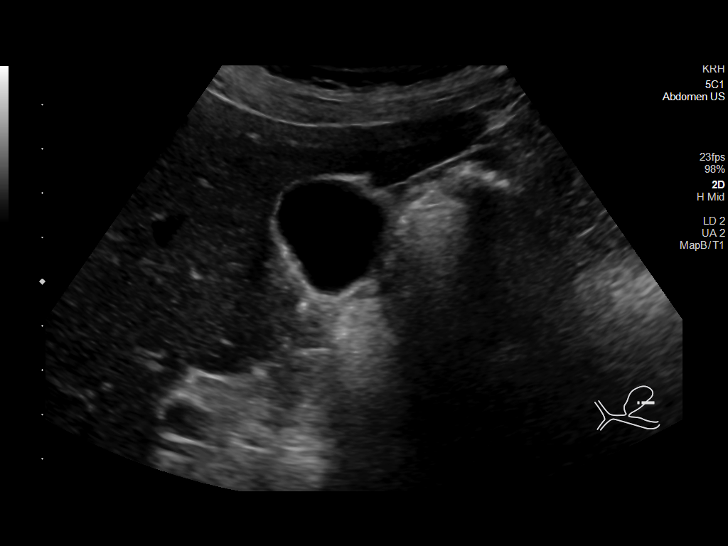
[im 19/49]
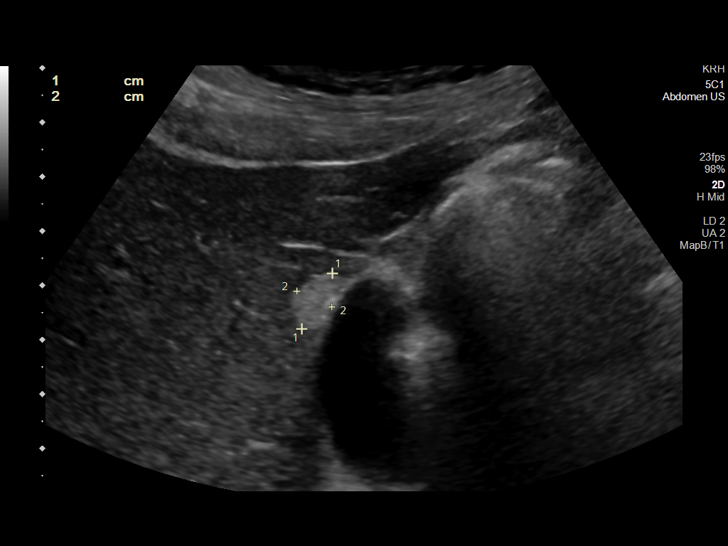
[im 23/49]
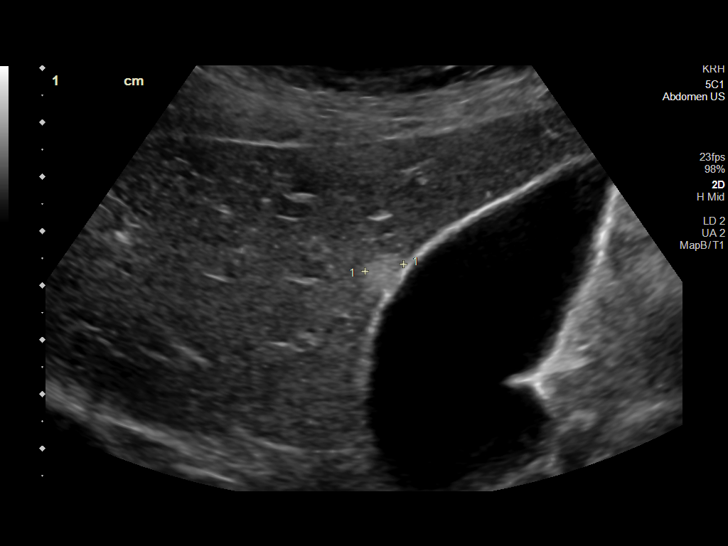
[im 27/49]
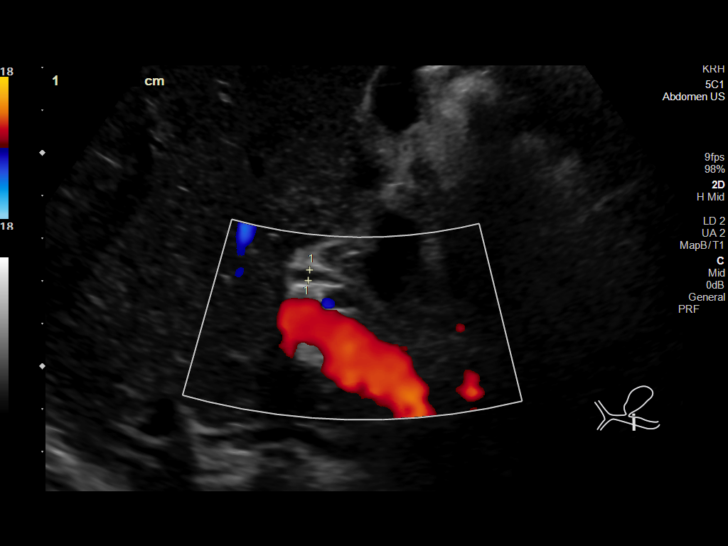
[im 31/49]
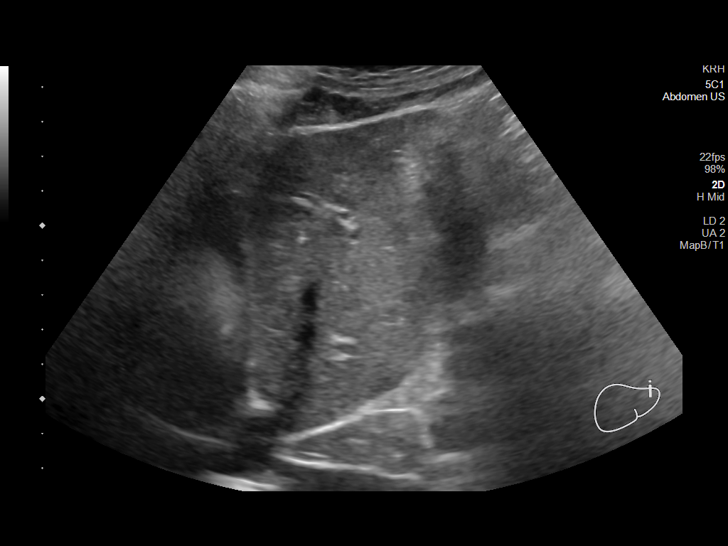
[im 33/49]
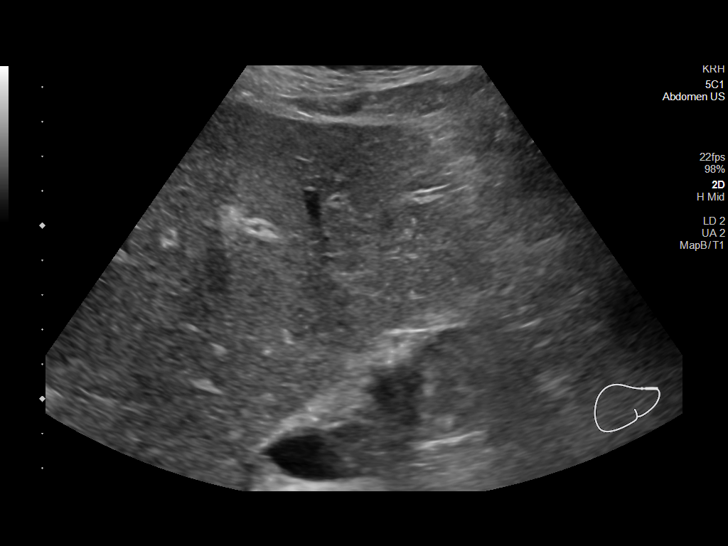
[im 37/49]
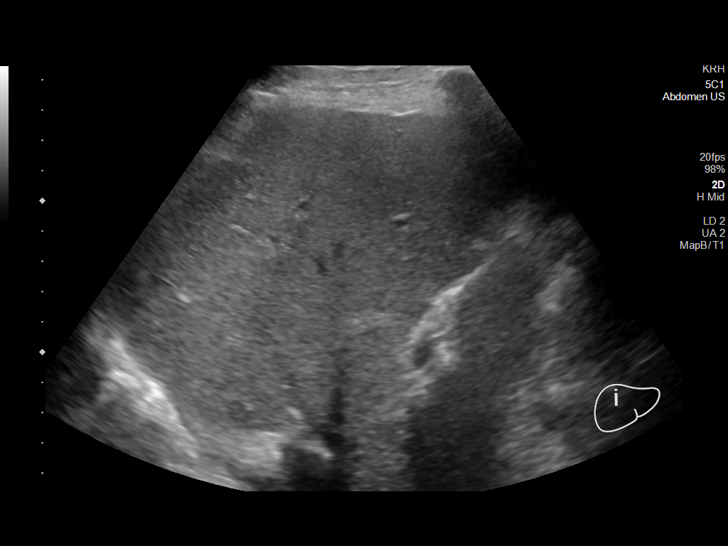
[im 41/49]
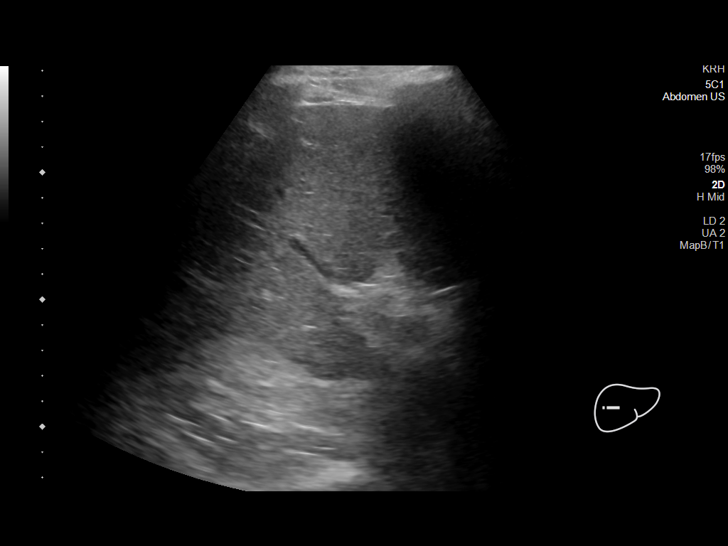
[im 45/49]
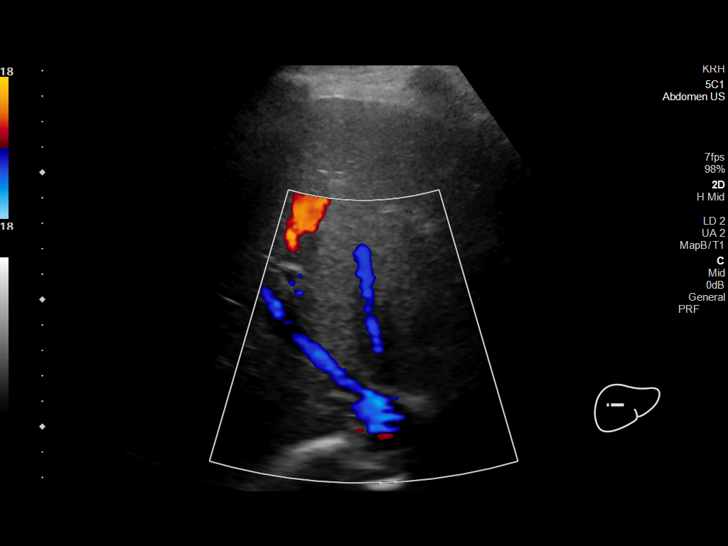
[im 49/49]
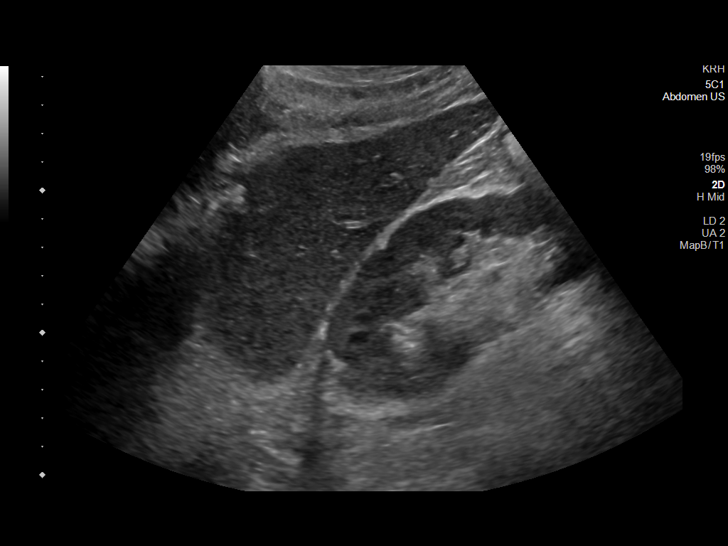

[14 of 25 positions shown; findings below may reference images not displayed]

FINDINGS: Gallbladder:

No gallstones or wall thickening visualized. No sonographic Murphy
sign noted by sonographer.

Common bile duct:

Diameter: 0.3 cm

Liver:

Small focus of focal fatty deposition adjacent to the gallbladder
fossa, incidental and benign. Within normal limits in parenchymal
echogenicity. Portal vein is patent on color Doppler imaging with
normal direction of blood flow towards the liver.

Other: None.
IMPRESSION: No sonographic finding to explain the patient's right upper quadrant
pain.

## 2023-01-17 ENCOUNTER — Other Ambulatory Visit: Payer: Self-pay

## 2023-01-17 ENCOUNTER — Other Ambulatory Visit (HOSPITAL_BASED_OUTPATIENT_CLINIC_OR_DEPARTMENT_OTHER): Payer: Self-pay

## 2023-01-17 ENCOUNTER — Other Ambulatory Visit: Payer: Self-pay | Admitting: Family Medicine

## 2023-01-17 ENCOUNTER — Other Ambulatory Visit (HOSPITAL_COMMUNITY): Payer: Self-pay

## 2023-01-17 DIAGNOSIS — I10 Essential (primary) hypertension: Secondary | ICD-10-CM

## 2023-01-17 DIAGNOSIS — E1142 Type 2 diabetes mellitus with diabetic polyneuropathy: Secondary | ICD-10-CM

## 2023-01-17 DIAGNOSIS — E1169 Type 2 diabetes mellitus with other specified complication: Secondary | ICD-10-CM

## 2023-01-17 DIAGNOSIS — E782 Mixed hyperlipidemia: Secondary | ICD-10-CM

## 2023-01-17 MED ORDER — METFORMIN HCL ER 500 MG PO TB24
1000.0000 mg | ORAL_TABLET | Freq: Two times a day (BID) | ORAL | 1 refills | Status: DC
  Filled 2023-01-17: qty 360, 90d supply, fill #0
  Filled 2023-04-15: qty 360, 90d supply, fill #1

## 2023-01-17 MED ORDER — OZEMPIC (0.25 OR 0.5 MG/DOSE) 2 MG/3ML ~~LOC~~ SOPN
0.5000 mg | PEN_INJECTOR | SUBCUTANEOUS | 1 refills | Status: DC
  Filled 2023-01-17: qty 3, 28d supply, fill #0

## 2023-01-17 MED ORDER — SILDENAFIL CITRATE 100 MG PO TABS
50.0000 mg | ORAL_TABLET | Freq: Every day | ORAL | 3 refills | Status: AC | PRN
  Filled 2023-01-17: qty 12, 12d supply, fill #0
  Filled 2023-05-20: qty 12, 12d supply, fill #1
  Filled 2023-10-14: qty 12, 12d supply, fill #2
  Filled 2023-12-18: qty 12, 12d supply, fill #3

## 2023-01-17 MED ORDER — LANTUS SOLOSTAR 100 UNIT/ML ~~LOC~~ SOPN
30.0000 [IU] | PEN_INJECTOR | Freq: Two times a day (BID) | SUBCUTANEOUS | 5 refills | Status: DC
  Filled 2023-01-17: qty 60, 100d supply, fill #0
  Filled 2023-04-24: qty 60, 100d supply, fill #1
  Filled 2023-05-20 – 2023-08-02 (×2): qty 60, 100d supply, fill #2

## 2023-01-17 MED ORDER — LISINOPRIL-HYDROCHLOROTHIAZIDE 20-25 MG PO TABS
1.0000 | ORAL_TABLET | Freq: Every day | ORAL | 1 refills | Status: DC
  Filled 2023-01-17: qty 90, 90d supply, fill #0
  Filled 2023-04-15: qty 90, 90d supply, fill #1

## 2023-01-17 MED ORDER — BD PEN NEEDLE NANO U/F 32G X 4 MM MISC
3 refills | Status: AC
  Filled 2023-01-17: qty 100, 50d supply, fill #0
  Filled 2023-03-18: qty 100, 50d supply, fill #1
  Filled 2023-05-20: qty 100, 50d supply, fill #2
  Filled 2023-09-05: qty 100, 50d supply, fill #3

## 2023-01-17 MED ORDER — LANCETS 28G MISC
3 refills | Status: DC
  Filled 2023-01-17: qty 100, 33d supply, fill #0

## 2023-01-17 MED ORDER — ATORVASTATIN CALCIUM 20 MG PO TABS
20.0000 mg | ORAL_TABLET | Freq: Every day | ORAL | 1 refills | Status: DC
Start: 2023-01-17 — End: 2023-05-20
  Filled 2023-01-17: qty 90, 90d supply, fill #0
  Filled 2023-04-15: qty 90, 90d supply, fill #1

## 2023-01-17 MED ORDER — FOLIC ACID 800 MCG PO TABS
400.0000 ug | ORAL_TABLET | Freq: Every day | ORAL | 1 refills | Status: DC
  Filled 2023-01-17: qty 45, 90d supply, fill #0

## 2023-01-17 MED ORDER — GLUCOSE BLOOD VI STRP
ORAL_STRIP | 3 refills | Status: DC
  Filled 2023-01-17: qty 100, 33d supply, fill #0
  Filled 2023-03-18: qty 100, 33d supply, fill #1

## 2023-01-18 ENCOUNTER — Other Ambulatory Visit: Payer: Self-pay

## 2023-01-21 ENCOUNTER — Other Ambulatory Visit (HOSPITAL_BASED_OUTPATIENT_CLINIC_OR_DEPARTMENT_OTHER): Payer: Self-pay

## 2023-01-21 ENCOUNTER — Other Ambulatory Visit (HOSPITAL_COMMUNITY): Payer: Self-pay

## 2023-01-23 ENCOUNTER — Other Ambulatory Visit (HOSPITAL_COMMUNITY): Payer: Self-pay

## 2023-02-10 NOTE — Progress Notes (Unsigned)
   Connor Moore T. Tyjae Issa, MD, CAQ Sports Medicine [facility] at [facility] [address] [place], [address]  Phone: [phone]  FAX: [phone]  Connor Moore - 73 y.o. male  MRN [id number]  Date of Birth: 1950/07/25  Date: 02/11/2023  PCP: Hannah Beat, MD  Referral: Hannah Beat, MD  No chief complaint on file.  Subjective:   Connor Moore is a 73 y.o. very pleasant male patient with There is no height or weight on file to calculate BMI. who presents with the following:  Connor Moore is here for 58-month follow-up, predominantly for diabetes, and additionally for hypertension and hyperlipidemia follow-up.  Diabetes Mellitus: Tolerating Medications: yes Compliance with diet: fair, There is no height or weight on file to calculate BMI. Exercise: minimal / intermittent Avg blood sugars at home: not checking Foot problems: none Hypoglycemia: none No nausea, vomitting, blurred vision, polyuria.  Lab Results  Component Value Date   HGBA1C 7.2 (H) 06/12/2022   HGBA1C 7.0 (A) 12/11/2021   HGBA1C 11.2 (A) 08/07/2021   Lab Results  Component Value Date   MICROALBUR <0.7 09/28/2019   LDLCALC 42 06/12/2022   CREATININE 0.88 06/12/2022    Wt Readings from Last 3 Encounters:  08/08/22 195 lb (88.5 kg)  07/20/22 195 lb (88.5 kg)  06/18/22 197 lb 2 oz (89.4 kg)    HTN: Tolerating all medications without side effects Stable and at goal No CP, no sob. No HA.  BP Readings from Last 3 Encounters:  08/08/22 121/74  06/18/22 (!) 146/82  04/23/22 (!) 140/76    Basic Metabolic Panel:    Component Value Date/Time   NA 138 06/12/2022 0906   K 4.7 06/12/2022 0906   CL 103 06/12/2022 0906   CO2 27 06/12/2022 0906   BUN 17 06/12/2022 0906   CREATININE 0.88 06/12/2022 0906   GLUCOSE 114 (H) 06/12/2022 0906   CALCIUM 9.4 06/12/2022 0906    Lipids: Doing well, stable. Tolerating meds fine with no SE. Panel reviewed with patient.  Lipids: Lab  Results  Component Value Date   CHOL 91 06/12/2022   Lab Results  Component Value Date   HDL 34.60 (L) 06/12/2022   Lab Results  Component Value Date   LDLCALC 42 06/12/2022   Lab Results  Component Value Date   TRIG 72.0 06/12/2022   Lab Results  Component Value Date   CHOLHDL 3 06/12/2022    Lab Results  Component Value Date   ALT 19 06/12/2022   AST 17 06/12/2022   ALKPHOS 80 06/12/2022   BILITOT 0.7 06/12/2022     Review of Systems is noted in the HPI, as appropriate  Objective:   There were no vitals taken for this visit.  GEN: No acute distress; alert,appropriate. PULM: Breathing comfortably in no respiratory distress PSYCH: Normally interactive.   Laboratory and Imaging Data:  Assessment and Plan:   ***

## 2023-02-11 ENCOUNTER — Other Ambulatory Visit: Payer: Self-pay

## 2023-02-11 ENCOUNTER — Ambulatory Visit
Admission: RE | Admit: 2023-02-11 | Discharge: 2023-02-11 | Disposition: A | Discharge status: Home / Self Care | Payer: HMO | Source: Ambulatory Visit | Attending: Family Medicine | Admitting: Family Medicine

## 2023-02-11 ENCOUNTER — Telehealth: Payer: Self-pay | Admitting: Family Medicine

## 2023-02-11 ENCOUNTER — Ambulatory Visit (INDEPENDENT_AMBULATORY_CARE_PROVIDER_SITE_OTHER): Payer: HMO | Admitting: Family Medicine

## 2023-02-11 ENCOUNTER — Encounter: Payer: Self-pay | Admitting: Family Medicine

## 2023-02-11 VITALS — BP 130/74 | HR 76 | Temp 98.4°F | Ht 70.5 in

## 2023-02-11 DIAGNOSIS — E782 Mixed hyperlipidemia: Secondary | ICD-10-CM | POA: Diagnosis not present

## 2023-02-11 DIAGNOSIS — I10 Essential (primary) hypertension: Secondary | ICD-10-CM

## 2023-02-11 DIAGNOSIS — E1142 Type 2 diabetes mellitus with diabetic polyneuropathy: Secondary | ICD-10-CM

## 2023-02-11 DIAGNOSIS — F17200 Nicotine dependence, unspecified, uncomplicated: Secondary | ICD-10-CM | POA: Diagnosis not present

## 2023-02-11 DIAGNOSIS — I771 Stricture of artery: Secondary | ICD-10-CM | POA: Diagnosis not present

## 2023-02-11 DIAGNOSIS — Z794 Long term (current) use of insulin: Secondary | ICD-10-CM | POA: Diagnosis not present

## 2023-02-11 DIAGNOSIS — R053 Chronic cough: Secondary | ICD-10-CM | POA: Diagnosis not present

## 2023-02-11 DIAGNOSIS — I700 Atherosclerosis of aorta: Secondary | ICD-10-CM | POA: Diagnosis not present

## 2023-02-11 LAB — POCT GLYCOSYLATED HEMOGLOBIN (HGB A1C): Hemoglobin A1C: 8.1 % — AB (ref 4.0–5.6)

## 2023-02-11 MED ORDER — SEMAGLUTIDE (1 MG/DOSE) 4 MG/3ML ~~LOC~~ SOPN
1.0000 mg | PEN_INJECTOR | SUBCUTANEOUS | 2 refills | Status: DC
  Filled 2023-02-11: qty 3, 28d supply, fill #0
  Filled 2023-03-19: qty 3, 28d supply, fill #1
  Filled 2023-04-09: qty 3, 28d supply, fill #2

## 2023-02-11 NOTE — Telephone Encounter (Signed)
Received fax from [facility] [employer] asking for additional information in order to approve coverage on patient's Ozempic.  Form completed and faxed back.  Medication Approved effective 02/08/2023 through 02/08/2024.

## 2023-02-11 NOTE — Patient Instructions (Signed)
Increase Metformin to 4 tablets a day  Increase Ozempic to 1 mg each week

## 2023-02-12 ENCOUNTER — Other Ambulatory Visit (HOSPITAL_COMMUNITY): Payer: Self-pay

## 2023-02-12 ENCOUNTER — Telehealth: Payer: Self-pay | Admitting: Acute Care

## 2023-02-12 ENCOUNTER — Other Ambulatory Visit: Payer: Self-pay

## 2023-02-12 ENCOUNTER — Encounter: Payer: Self-pay | Admitting: Family Medicine

## 2023-02-12 DIAGNOSIS — F17210 Nicotine dependence, cigarettes, uncomplicated: Secondary | ICD-10-CM

## 2023-02-12 DIAGNOSIS — Z122 Encounter for screening for malignant neoplasm of respiratory organs: Secondary | ICD-10-CM

## 2023-02-12 DIAGNOSIS — Z87891 Personal history of nicotine dependence: Secondary | ICD-10-CM

## 2023-02-12 LAB — MICROALBUMIN / CREATININE URINE RATIO
Creatinine,U: 235.3 mg/dL
Microalb Creat Ratio: 7.5 mg/g (ref 0.0–30.0)
Microalb, Ur: 17.8 mg/dL — ABNORMAL HIGH (ref 0.0–1.9)

## 2023-02-12 NOTE — Telephone Encounter (Signed)
Lung Cancer Screening Narrative/Criteria Questionnaire (Cigarette Smokers Only- No Cigars/Pipes/vapes)   Connor Moore   SDMV:02/21/23 at 2956O w/ Dorene Grebe                                           [birth year]                         LDCT: 02/26/23 at 0830am / [facility]    73 y.o.  Phone: [phone] /db  Lung Screening Narrative (confirm age 77-77 yrs Medicare / 50-80 yrs Private pay insurance)   Insurance information:HTA   Referring Provider:[place]    This screening involves an initial phone call with a team member from our program. It is called a shared decision making visit. The initial meeting is required by insurance and Medicare to make sure you understand the program. This appointment takes about 15-20 minutes to complete. The CT scan will completed at a separate date/time. This scan takes about 5-10 minutes to complete and you may eat and drink before and after the scan.  Criteria questions for Lung Cancer Screening:   Are you a current or former smoker? Current Age began smoking: 73 yo   If you are a former smoker, what year did you quit smoking? {NA (within 15 yrs)   To calculate your smoking history, I need an accurate estimate of how many packs of cigarettes you smoked per day and for how many years. (Not just the number of PPD you are now smoking)   Years smoking 51 x Packs per day 1 = Pack years 51   (at least 20 pack yrs)   (Make sure they understand that we need to know how much they have smoked in the past, not just the number of PPD they are smoking now)  Do you have a personal history of cancer?  No    Do you have a family history of cancer? No  Are you coughing up blood?  No  Have you had unexplained weight loss of 15 lbs or more in the last 6 months? No  It looks like you meet all criteria.     Additional information: [name]

## 2023-02-21 ENCOUNTER — Ambulatory Visit (INDEPENDENT_AMBULATORY_CARE_PROVIDER_SITE_OTHER): Payer: HMO | Admitting: Acute Care

## 2023-02-21 DIAGNOSIS — F17210 Nicotine dependence, cigarettes, uncomplicated: Secondary | ICD-10-CM | POA: Diagnosis not present

## 2023-02-21 NOTE — Progress Notes (Addendum)
 Provider Attestation I agree with the documentation of the Shared Decision Making visit,  smoking cessation counseling if appropriate, and verification or eligibility for lung cancer screening as documented by the RN Nurse Navigator.   Raejean Bullock, MSN, AGACNP-BC [facility] See Amion for personal pager PCCM on call pager [phone]     Virtual Visit via Telephone Note  I connected with Connor Moore on 02/21/23 at  9:30 AM EST by telephone and verified that I am speaking with the correct person using two identifiers.  Location: Patient: Connor Moore Provider: Alyse Bach, RN   I discussed the limitations, risks, security and privacy concerns of performing an evaluation and management service by telephone and the availability of in person appointments. I also discussed with the patient that there may be a patient responsible charge related to this service. The patient expressed understanding and agreed to proceed.    Shared Decision Making Visit Lung Cancer Screening Program [phone])   Eligibility: Age 4 y.o. Pack Years Smoking History Calculation 51 (# packs/per year x # years smoked) Recent History of coughing up blood  no Unexplained weight loss? no ( >Than 15 pounds within the last 6 months ) Prior History Lung / other cancer yes (Diagnosis within the last 5 years already requiring surveillance chest CT Scans). Smoking Status Current Smoker Former Smokers: Years since quit: n/a  Quit Date: n/a  Visit Components: Discussion included one or more decision making aids. yes Discussion included risk/benefits of screening. yes Discussion included potential follow up diagnostic testing for abnormal scans. yes Discussion included meaning and risk of over diagnosis. yes Discussion included meaning and risk of False Positives. yes Discussion included meaning of total radiation exposure. yes  Counseling Included: Importance of  adherence to annual lung cancer LDCT screening. yes Impact of comorbidities on ability to participate in the program. yes Ability and willingness to under diagnostic treatment. yes  Smoking Cessation Counseling: Current Smokers:  Discussed importance of smoking cessation. yes Information about tobacco cessation classes and interventions provided to patient. yes Patient provided with "ticket" for LDCT Scan. no Symptomatic Patient. no  Counseling(Intermediate counseling: > three minutes) 99406 Diagnosis Code: Tobacco Use Z72.0 Asymptomatic Patient yes  Counseling (Intermediate counseling: > three minutes counseling) C1660 Former Smokers:  Discussed the importance of maintaining cigarette abstinence. yes Diagnosis Code: Personal History of Nicotine Dependence. Y30.160 Information about tobacco cessation classes and interventions provided to patient. Yes Patient provided with "ticket" for LDCT Scan. no Written Order for Lung Cancer Screening with LDCT placed in Epic. Yes (CT Chest Lung Cancer Screening Low Dose W/O CM) [id number] Z12.2-Screening of respiratory organs Z87.891-Personal history of nicotine dependence   Alyse Bach, RN

## 2023-02-21 NOTE — Patient Instructions (Signed)

## 2023-02-26 ENCOUNTER — Ambulatory Visit
Admission: RE | Admit: 2023-02-26 | Discharge: 2023-02-26 | Disposition: A | Discharge status: Home / Self Care | Payer: PPO | Source: Ambulatory Visit | Attending: Acute Care | Admitting: Acute Care

## 2023-02-26 DIAGNOSIS — Z122 Encounter for screening for malignant neoplasm of respiratory organs: Secondary | ICD-10-CM | POA: Diagnosis not present

## 2023-02-26 DIAGNOSIS — Z87891 Personal history of nicotine dependence: Secondary | ICD-10-CM | POA: Insufficient documentation

## 2023-02-26 DIAGNOSIS — F17210 Nicotine dependence, cigarettes, uncomplicated: Secondary | ICD-10-CM | POA: Diagnosis not present

## 2023-03-11 ENCOUNTER — Telehealth: Payer: Self-pay

## 2023-03-11 DIAGNOSIS — I7121 Aneurysm of the ascending aorta, without rupture: Secondary | ICD-10-CM

## 2023-03-11 DIAGNOSIS — R911 Solitary pulmonary nodule: Secondary | ICD-10-CM

## 2023-03-11 NOTE — Telephone Encounter (Signed)
 Call report from [place]  IMPRESSION: 1. Lung-RADS 3, probably benign findings. Short-term follow-up in 6 months is recommended with repeat low-dose chest CT without contrast (please use the following order, "CT CHEST LCS NODULE FOLLOW-UP W/O CM"). Two scattered solid right pulmonary nodules, largest 6.5 mm in volume derived mean diameter in the right lower lobe. 2. Three-vessel coronary atherosclerosis. 3. 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 Ascending thoracic aortic aneurysm. Recommend semi-annual imaging followup by CTA or MRA and referral to cardiothoracic surgery if not already obtained. This recommendation follows 2010 ACCF/AHA/AATS/ACR/ASA/SCA/SCAI/SIR/STS/SVM Guidelines for the Diagnosis and Management of Patients With Thoracic Aortic Disease. Circulation. 2010; 121: Z610-R604. Aortic aneurysm NOS (ICD10-I71.9) 4. Aortic Atherosclerosis (ICD10-I70.0) and Emphysema (ICD10-J43.9).

## 2023-03-13 NOTE — Telephone Encounter (Signed)
 Patient called back. Informed him of the results and recommendations. Patient verbalized understanding and is agreeable to 6 month follow up scan. Patient was reluctant to thoracic surgery referral. After discussing the importance of monitoring patients TAA along with BP control pt was agreeable to referral. Both orders placed. Pt verbalized understanding and denied any further questions or concerns at this time.

## 2023-03-13 NOTE — Telephone Encounter (Signed)
 Called and left VM for pt

## 2023-03-13 NOTE — Telephone Encounter (Signed)
 Patient had LDCT on 02/26/2023 that resulted as an LR3. Discussed results with Kandice Robinsons NP, pt will need 6 month LDCT for follow up and referral to thoracic surgery for 4.6cm TAA.

## 2023-03-13 NOTE — Addendum Note (Signed)
 Addended by: Sheran Luz on: 03/13/2023 04:34 PM   Modules accepted: Orders

## 2023-03-15 NOTE — Progress Notes (Signed)
 [address]       [place] [address]             [phone]        Connor Moore [id number] May 29, 1950  History of Present Illness:  Connor Moore is a 73 yo male with history of HTN, HLD, DM, and nicotine abuse.  He recently underwent low dose lung cancer screening on 02/27/2023.  During that scan he was noted to have an incidental finding of an Ascending Aortic aneurysm of 4.6 cm.  He was also noted to have right lower lobe pulmonary nodules, largest measuring 6.5 mm.  He has been referred to [place] to establish surgical surveillance.  He is a current smoker.  Overall patient is doing well.  He denies chest pain and shortness of breath.  He remains active and enjoys playing golf.  He is followed by his primary [facility]ary physician and is compliant with medications.   Current Outpatient Medications on File Prior to Visit  Medication Sig Dispense Refill   aspirin 81 MG tablet Take 81 mg by mouth daily.     atorvastatin (LIPITOR) 20 MG tablet Take 1 tablet (20 mg total) by mouth daily. 90 tablet 1   glucose blood test strip Use to check blood sugar up to 3 times a day 100 each 3   insulin glargine (LANTUS SOLOSTAR) 100 UNIT/ML Solostar Pen Inject 30 Units into the skin 2 (two) times daily. 60 mL 5   Insulin Pen Needle (BD PEN NEEDLE NANO U/F) 32G X 4 MM MISC USE ONE DAILY ALONG WITH YOUR INSULIN PEN 100 each 3   Lancets 28G MISC Use to check blood sugars up to 3 times a day 100 each 3   lisinopril-hydrochlorothiazide (ZESTORETIC) 20-25 MG tablet Take 1 tablet by mouth daily. 90 tablet 1   metFORMIN (GLUCOPHAGE-XR) 500 MG 24 hr tablet Take 2 tablets (1,000 mg total) by mouth 2 (two) times daily. 360 tablet 1   Multiple Vitamin (MULTIVITAMIN WITH MINERALS) TABS tablet Take 1 tablet by mouth daily.     Multiple Vitamins-Minerals (VISION FORMULA PO) Take 1 tablet by mouth daily.     Semaglutide, 1 MG/DOSE, 4 MG/3ML SOPN Inject 1 mg into the skin once a week. 3 mL 2    sildenafil (VIAGRA) 100 MG tablet Take 0.5-1 tablets (50-100 mg total) by mouth daily as needed for erectile dysfunction. 12 tablet 3   No current facility-administered medications on file prior to visit.     Physical Exam  BP (!) 153/97   Pulse 84   Resp 18   Ht 5\' 10"  (1.778 m)   Wt 205 lb (93 kg)   SpO2 94%   BMI 29.41 kg/m   Gen: NAD Heart: RRR Lungs: CTA bilaterally Ext: no edema Neck: no carotid bruit Neuro: grossly normal  CTA Results:  FINDINGS: Cardiovascular: Normal heart size. No significant pericardial effusion/thickening. Three-vessel coronary atherosclerosis. Atherosclerotic thoracic aorta with 4.6 cm diameter ascending thoracic aortic aneurysm. Normal caliber pulmonary arteries.   Mediastinum/Nodes: No significant thyroid nodules. Unremarkable esophagus. No pathologically enlarged axillary, mediastinal or hilar lymph nodes, noting limited sensitivity for the detection of hilar adenopathy on this noncontrast study.   Lungs/Pleura: No pneumothorax. No pleural effusion. Mild centrilobular emphysema with diffuse bronchial wall thickening. No acute consolidative airspace disease or lung masses. Two scattered solid right pulmonary nodules, largest 6.5 mm in volume derived mean diameter in the right lower lobe on series 3/image 193.  Upper abdomen: No acute abnormality.   Musculoskeletal: No aggressive appearing focal osseous lesions. Moderate thoracic spondylosis.   IMPRESSION: 1. Lung-RADS 3, probably benign findings. Short-term follow-up in 6 months is recommended with repeat low-dose chest CT without contrast (please use the following order, "CT CHEST LCS NODULE FOLLOW-UP W/O CM"). Two scattered solid right pulmonary nodules, largest 6.5 mm in volume derived mean diameter in the right lower lobe. 2. Three-vessel coronary atherosclerosis. 3. 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 Ascending thoracic aortic aneurysm. Recommend semi-annual  imaging followup by CTA or MRA and referral to cardiothoracic surgery if not already obtained. This recommendation follows 2010 ACCF/AHA/AATS/ACR/ASA/SCA/SCAI/SIR/STS/SVM Guidelines for the Diagnosis and Management of Patients With Thoracic Aortic Disease. Circulation. 2010; 121: H086-V784. Aortic aneurysm NOS (ICD10-I71.9) 4. Aortic Atherosclerosis (ICD10-I70.0) and Emphysema (ICD10-J43.9).   These results will be called to the ordering clinician or representative by the Radiologist Assistant, and communication documented in the PACS or [employer].     Electronically Signed   By: Delbert Phenix M.D.    A/P:  Incidental finding of Ascending Aortic Aneurysm measuring 4.6 cm- this will require bi-annual surveillance... this would be best obtained by a CTA Chest.. however, he is also due for repeat CT chest for follow up of his lung nodules. Pulmonary nodules- solid on right, for repeat scan in August HTN- slightly elevated today, per patient usually very well controlled, could be related to today's visit.[name] will monitor closely HLD- on Lipitor DM  RTC in 6 months with repeat CT scan.. lung cancer screening CT scan is okay  Risk Modification:  Statin:  Yes  Smoking cessation instruction/counseling given:  counseled patient on the dangers of tobacco use, advised patient to stop smoking, and reviewed strategies to maximize success  Patient was counseled on importance of Blood Pressure Control.  Despite Medical intervention if the patient notices persistently elevated blood pressure readings.  They are instructed to contact their Primary Care Physician  Please avoid use of Fluoroquinolones as this can potentially increase your risk of Aortic Rupture and/or Dissection  Patient educated on signs and symptoms of Aortic Dissection, handout also provided in AVS  Kahmari Koller, PA-C 03/15/23

## 2023-03-18 ENCOUNTER — Other Ambulatory Visit (HOSPITAL_COMMUNITY): Payer: Self-pay

## 2023-03-18 ENCOUNTER — Other Ambulatory Visit: Payer: Self-pay

## 2023-03-18 ENCOUNTER — Other Ambulatory Visit: Payer: Self-pay | Admitting: Family Medicine

## 2023-03-18 DIAGNOSIS — E1142 Type 2 diabetes mellitus with diabetic polyneuropathy: Secondary | ICD-10-CM

## 2023-03-18 MED ORDER — ONETOUCH ULTRA BLUE TEST VI STRP
ORAL_STRIP | 3 refills | Status: AC
  Filled 2023-03-18: qty 200, 66d supply, fill #0
  Filled 2023-05-20: qty 200, 66d supply, fill #1
  Filled 2023-09-05: qty 200, 66d supply, fill #2

## 2023-03-18 MED ORDER — ONETOUCH ULTRA 2 W/DEVICE KIT
PACK | 0 refills | Status: AC
  Filled 2023-03-18: qty 1, 30d supply, fill #0

## 2023-03-18 MED ORDER — ONETOUCH ULTRASOFT LANCETS MISC
3 refills | Status: AC
  Filled 2023-03-18: qty 200, 66d supply, fill #0
  Filled 2023-05-17: qty 200, 66d supply, fill #1
  Filled 2023-07-22: qty 200, 66d supply, fill #2
  Filled 2023-09-26: qty 200, 66d supply, fill #3

## 2023-03-19 ENCOUNTER — Other Ambulatory Visit: Payer: Self-pay

## 2023-03-20 ENCOUNTER — Other Ambulatory Visit (HOSPITAL_COMMUNITY): Payer: Self-pay

## 2023-03-26 ENCOUNTER — Institutional Professional Consult (permissible substitution): Payer: PPO

## 2023-03-26 VITALS — BP 153/97 | HR 84 | Resp 18 | Ht 70.0 in | Wt 205.0 lb

## 2023-03-26 DIAGNOSIS — I7121 Aneurysm of the ascending aorta, without rupture: Secondary | ICD-10-CM

## 2023-03-26 NOTE — Patient Instructions (Signed)
 Make every effort to maintain a "heart-healthy" lifestyle with regular physical exercise and adherence to a low-fat, low-carbohydrate diet.  Continue to seek regular follow-up appointments with your primary care physician and/or cardiologist.   AVOID FLOUROQUINOLONES AS THESE CAN INCREASE YOUR RISK OF AORTIC DISSECTION

## 2023-04-24 ENCOUNTER — Other Ambulatory Visit (HOSPITAL_COMMUNITY): Payer: Self-pay

## 2023-05-16 ENCOUNTER — Other Ambulatory Visit: Payer: Self-pay | Admitting: Family Medicine

## 2023-05-16 ENCOUNTER — Other Ambulatory Visit: Payer: Self-pay

## 2023-05-16 ENCOUNTER — Other Ambulatory Visit (HOSPITAL_COMMUNITY): Payer: Self-pay

## 2023-05-16 MED ORDER — SEMAGLUTIDE (2 MG/DOSE) 8 MG/3ML ~~LOC~~ SOPN
2.0000 mg | PEN_INJECTOR | SUBCUTANEOUS | 3 refills | Status: DC
Start: 2023-05-16 — End: 2023-08-22
  Filled 2023-05-16: qty 3, 28d supply, fill #0
  Filled 2023-05-20 – 2023-06-06 (×2): qty 3, 28d supply, fill #1
  Filled 2023-07-04: qty 3, 28d supply, fill #2
  Filled 2023-08-01: qty 3, 28d supply, fill #3

## 2023-05-16 NOTE — Telephone Encounter (Signed)
 Last office visit 02/11/23 for DM.  Last refilled 02/11/23 for 3 ml with no refills.  Office note states if tolerated will increase to 2 mg week indefinitely.  Next Appt: CPE 08/12/23.  Please advise.

## 2023-05-17 ENCOUNTER — Other Ambulatory Visit (HOSPITAL_COMMUNITY): Payer: Self-pay

## 2023-05-20 ENCOUNTER — Other Ambulatory Visit (HOSPITAL_COMMUNITY): Payer: Self-pay

## 2023-05-20 ENCOUNTER — Other Ambulatory Visit: Payer: Self-pay | Admitting: Family Medicine

## 2023-05-20 DIAGNOSIS — I10 Essential (primary) hypertension: Secondary | ICD-10-CM

## 2023-05-20 DIAGNOSIS — E782 Mixed hyperlipidemia: Secondary | ICD-10-CM

## 2023-05-20 DIAGNOSIS — Z794 Long term (current) use of insulin: Secondary | ICD-10-CM

## 2023-05-20 MED ORDER — ATORVASTATIN CALCIUM 20 MG PO TABS
20.0000 mg | ORAL_TABLET | Freq: Every day | ORAL | 1 refills | Status: DC
  Filled 2023-05-20 – 2023-07-13 (×2): qty 90, 90d supply, fill #0
  Filled 2023-07-24 – 2023-10-11 (×2): qty 90, 90d supply, fill #1

## 2023-05-20 MED ORDER — LISINOPRIL-HYDROCHLOROTHIAZIDE 20-25 MG PO TABS
1.0000 | ORAL_TABLET | Freq: Every day | ORAL | 1 refills | Status: DC
  Filled 2023-05-20 – 2023-07-13 (×2): qty 90, 90d supply, fill #0
  Filled 2023-10-11: qty 90, 90d supply, fill #1

## 2023-05-20 MED ORDER — METFORMIN HCL ER 500 MG PO TB24
1000.0000 mg | ORAL_TABLET | Freq: Two times a day (BID) | ORAL | 1 refills | Status: DC
  Filled 2023-05-20 – 2023-07-13 (×2): qty 360, 90d supply, fill #0
  Filled 2023-10-11: qty 360, 90d supply, fill #1

## 2023-05-27 ENCOUNTER — Other Ambulatory Visit (HOSPITAL_COMMUNITY): Payer: Self-pay

## 2023-05-27 NOTE — Telephone Encounter (Signed)
 lvm for pt to call office to schedule appt.

## 2023-06-17 ENCOUNTER — Encounter: Payer: Self-pay | Admitting: Acute Care

## 2023-07-13 ENCOUNTER — Other Ambulatory Visit (HOSPITAL_COMMUNITY): Payer: Self-pay

## 2023-07-22 ENCOUNTER — Other Ambulatory Visit (HOSPITAL_COMMUNITY): Payer: Self-pay

## 2023-07-23 ENCOUNTER — Other Ambulatory Visit: Payer: Self-pay

## 2023-07-24 ENCOUNTER — Other Ambulatory Visit (HOSPITAL_COMMUNITY): Payer: Self-pay

## 2023-08-12 ENCOUNTER — Encounter: Payer: HMO | Admitting: Family Medicine

## 2023-08-14 ENCOUNTER — Encounter: Payer: Self-pay | Admitting: Acute Care

## 2023-08-20 ENCOUNTER — Encounter: Payer: Self-pay | Admitting: Family Medicine

## 2023-08-20 NOTE — Progress Notes (Unsigned)
 Cecille Mcclusky T. Elyon Zoll, MD, CAQ Sports Medicine [facility] at [facility] [address] [place], [address]  Phone: [phone]  FAX: [phone]  Connor Moore - 73 y.o. male  MRN [id number]  Date of Birth: [birth year]  Date: 08/22/2023  PCP: Watt Mirza, MD  Referral: Watt Mirza, MD  No chief complaint on file.  Patient Care Team: Watt Mirza, MD as PCP - General Lonni Slain, MD as PCP - Cardiology (Cardiology) Myra Rosaline FALCON, [facility] (Inactive) as Pharmacist (Pharmacist) Subjective:   Connor Moore is a 73 y.o. pleasant patient who presents with the following:  Preventative Health Maintenance Visit:  Health Maintenance Summary Reviewed and updated, unless pt declines services.  Tobacco History Reviewed. Alcohol: No concerns, no excessive use Exercise Habits: Some activity, rec at least 30 mins 5 times a week STD concerns: no risk or activity to increase risk Drug Use: None  History is significant for type 2 diabetes with extensive polyneuropathy and now with long-term insulin  usage. He is on Lantus  as well as metformin  2000 mg a day. He is also on Ozempic  2 mg a day.  Shingrix Urine microalbumin Prevnar 20 COVID booster Eye exam  HTN: Tolerating all medications without side effects Stable and at goal No CP, no sob. No HA.  BP Readings from Last 3 Encounters:  03/26/23 (!) 153/97  02/11/23 130/74  08/08/22 121/74    Basic Metabolic Panel:    Component Value Date/Time   NA 138 06/12/2022 0906   K 4.7 06/12/2022 0906   CL 103 06/12/2022 0906   CO2 27 06/12/2022 0906   BUN 17 06/12/2022 0906   CREATININE 0.88 06/12/2022 0906   GLUCOSE 114 (H) 06/12/2022 0906   CALCIUM  9.4 06/12/2022 0906    Lipids: Doing well, stable. Tolerating meds fine with no SE. Panel reviewed with patient.  Lipids: Lab Results  Component Value Date   CHOL 91 06/12/2022   Lab Results  Component Value Date   HDL  34.60 (L) 06/12/2022   Lab Results  Component Value Date   LDLCALC 42 06/12/2022   Lab Results  Component Value Date   TRIG 72.0 06/12/2022   Lab Results  Component Value Date   CHOLHDL 3 06/12/2022    Lab Results  Component Value Date   ALT 19 06/12/2022   AST 17 06/12/2022   ALKPHOS 80 06/12/2022   BILITOT 0.7 06/12/2022     Health Maintenance  Topic Date Due   Zoster Vaccines- Shingrix (1 of 2) Never done   Diabetic kidney evaluation - Urine ACR  03/29/2009   COVID-19 Vaccine (5 - 2024-25 season) 09/16/2022   OPHTHALMOLOGY EXAM  01/18/2023   Diabetic kidney evaluation - eGFR measurement  06/12/2023   FOOT EXAM  06/18/2023   Medicare Annual Wellness (AWV)  06/18/2023   HEMOGLOBIN A1C  08/11/2023   INFLUENZA VACCINE  08/16/2023   Pneumococcal Vaccine: 50+ Years (1 of 2 - PCV) 02/11/2024 (Originally 03/31/1969)   Lung Cancer Screening  02/26/2024   DTaP/Tdap/Td (2 - Td or Tdap) 03/30/2025   Colonoscopy  08/07/2025   Hepatitis C Screening  Completed   Hepatitis B Vaccines  Aged Out   HPV VACCINES  Aged Out   Meningococcal B Vaccine  Aged Out   Immunization History  Administered Date(s) Administered   PFIZER Comirnaty(Gray Top)Covid-19 Tri-Sucrose Vaccine 08/11/2020   PFIZER(Purple Top)SARS-COV-2 Vaccination 02/21/2019, 03/14/2019, 09/14/2019   Tdap 03/31/2015   Patient Active Problem List   Diagnosis Date  Noted   Controlled type 2 diabetes mellitus with diabetic polyneuropathy, with long-term current use of insulin  (HCC) 04/01/2008    Priority: High   Diabetic polyneuropathy associated with type 2 diabetes mellitus (HCC) 12/07/2010    Priority: Medium    Hyperlipidemia 02/12/2008    Priority: Medium    Erectile dysfunction associated with type 2 diabetes mellitus (HCC) 02/12/2008    Priority: Medium    Essential hypertension 02/12/2008    Priority: Medium    One Testicle, 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 03/10/2013   Colon polyps    TOBACCO USE  05/19/2009    Past Medical History:  Diagnosis Date   Diabetes mellitus type II    Diabetic neuropathy (HCC) 12/07/2010   Diverticulosis    ED (erectile dysfunction)    HLD (hyperlipidemia)    HTN (hypertension)     Past Surgical History:  Procedure Laterality Date   APPENDECTOMY     INGUINAL HERNIA REPAIR  1974   KNEE ARTHROSCOPY  1986   Right   Left Hydrocele surgery  1974   REPLACEMENT UNICONDYLAR JOINT KNEE  2009   TESTICLE REMOVAL  1974   Left   TONSILLECTOMY     VASECTOMY  1981    Family History  Problem Relation Age of Onset   Mental illness Mother        ?    Diabetes Father    Cancer Father        mouth   Diabetes Sister    Colon cancer Neg Hx    Esophageal cancer Neg Hx    Rectal cancer Neg Hx    Stomach cancer Neg Hx    Colon polyps Neg Hx     Social History   Social History Narrative   Not on file    Past Medical History, Surgical History, Social History, Family History, Problem List, Medications, and Allergies have been reviewed and updated if relevant.  Review of Systems: Pertinent positives are listed above.  Otherwise, a full 14 point review of systems has been done in full and it is negative except where it is noted positive.  Objective:   There were no vitals taken for this visit. Ideal Body Weight:    Ideal Body Weight:   No results found.    06/18/2022    8:21 AM 04/23/2022    9:17 AM 11/07/2020    8:21 AM 09/28/2019    8:24 AM 09/24/2018    8:28 AM  Depression screen PHQ 2/9  Decreased Interest 0 0 0 0 0  Down, Depressed, Hopeless 0 0 0 0 0  PHQ - 2 Score 0 0 0 0 0     GEN: well developed, well nourished, no acute distress Eyes: conjunctiva and lids normal, PERRLA, EOMI ENT: TM clear, nares clear, oral exam WNL Neck: supple, no lymphadenopathy, no thyromegaly, no JVD Pulm: clear to auscultation and percussion, respiratory effort normal CV: regular rate and rhythm, S1-S2, no murmur, rub or gallop, no bruits, peripheral pulses  normal and symmetric, no cyanosis, clubbing, edema or varicosities GI: soft, non-tender; no hepatosplenomegaly, masses; active bowel sounds all quadrants GU: deferred Lymph: no cervical, axillary or inguinal adenopathy MSK: gait normal, muscle tone and strength WNL, no joint swelling, effusions, discoloration, crepitus  SKIN: clear, good turgor, color WNL, no rashes, lesions, or ulcerations Neuro: normal mental status, normal strength, sensation, and motion Psych: alert; oriented to person, place and time, normally interactive and not anxious or depressed in appearance.  All labs  reviewed with patient. Results for orders placed or performed in visit on 02/11/23  POCT glycosylated hemoglobin (Hb A1C)   Collection Time: 02/11/23  9:31 AM  Result Value Ref Range   Hemoglobin A1C 8.1 (A) 4.0 - 5.6 %   HbA1c POC (<> result, manual entry)     HbA1c, POC (prediabetic range)     HbA1c, POC (controlled diabetic range)      Assessment and Plan:     ICD-10-CM   1. Healthcare maintenance  Z00.00       Health Maintenance Exam: The patient's preventative maintenance and recommended screening tests for an annual wellness exam were reviewed in full today. Brought up to date unless services declined.  Counselled on the importance of diet, exercise, and its role in overall health and mortality. The patient's FH and SH was reviewed, including their home life, tobacco status, and drug and alcohol status.  Follow-up in 1 year for physical exam or additional follow-up below.  Disposition: No follow-ups on file.  No orders of the defined types were placed in this encounter.  There are no discontinued medications. No orders of the defined types were placed in this encounter.   Signed,  Jacques DASEN. Theoplis Garciagarcia, MD   Allergies as of 08/22/2023       Reactions   Ciprofloxacin Other (See Comments)   Patient has Ascending Aortic Aneurysm.. increases risk of dissection        Medication List         Accurate as of August 20, 2023 11:24 AM. If you have any questions, ask your nurse or doctor.          aspirin 81 MG tablet Take 81 mg by mouth daily.   atorvastatin  20 MG tablet Commonly known as: LIPITOR Take 1 tablet (20 mg total) by mouth daily.   Insupen Pen Needles 32G X 4 MM Misc Generic drug: Insulin  Pen Needle USE ONE DAILY ALONG WITH YOUR INSULIN  PEN   Lantus  SoloStar 100 UNIT/ML Solostar Pen Generic drug: insulin  glargine Inject 30 Units into the skin 2 (two) times daily.   lisinopril -hydrochlorothiazide  20-25 MG tablet Commonly known as: ZESTORETIC  Take 1 tablet by mouth daily.   metFORMIN  500 MG 24 hr tablet Commonly known as: GLUCOPHAGE -XR Take 2 tablets (1,000 mg total) by mouth 2 (two) times daily.   multivitamin with minerals Tabs tablet Take 1 tablet by mouth daily.   ONE TOUCH ULTRA 2 w/Device Kit Use to check blood sugar 3 times a day   OneTouch Ultra Test test strip Generic drug: glucose blood Use to check blood sugar 3 times a day   OneTouch UltraSoft 2 Lancets Misc Use to check blood sugar 3 times a day   Ozempic  (2 MG/DOSE) 8 MG/3ML Sopn Generic drug: Semaglutide  (2 MG/DOSE) Inject 2 mg as directed once a week.   sildenafil  100 MG tablet Commonly known as: VIAGRA  Take 0.5-1 tablets (50-100 mg total) by mouth daily as needed for erectile dysfunction.   VISION FORMULA PO Take 1 tablet by mouth daily.

## 2023-08-21 DIAGNOSIS — E119 Type 2 diabetes mellitus without complications: Secondary | ICD-10-CM | POA: Diagnosis not present

## 2023-08-21 DIAGNOSIS — Z961 Presence of intraocular lens: Secondary | ICD-10-CM | POA: Diagnosis not present

## 2023-08-21 DIAGNOSIS — H35033 Hypertensive retinopathy, bilateral: Secondary | ICD-10-CM | POA: Diagnosis not present

## 2023-08-21 LAB — HM DIABETES EYE EXAM

## 2023-08-22 ENCOUNTER — Other Ambulatory Visit: Payer: Self-pay | Admitting: Family Medicine

## 2023-08-22 ENCOUNTER — Other Ambulatory Visit (HOSPITAL_COMMUNITY): Payer: Self-pay

## 2023-08-22 ENCOUNTER — Ambulatory Visit (INDEPENDENT_AMBULATORY_CARE_PROVIDER_SITE_OTHER): Admitting: Family Medicine

## 2023-08-22 ENCOUNTER — Other Ambulatory Visit: Payer: Self-pay

## 2023-08-22 ENCOUNTER — Encounter: Payer: Self-pay | Admitting: Family Medicine

## 2023-08-22 VITALS — BP 140/70 | HR 76 | Temp 97.3°F | Ht 70.5 in | Wt 201.4 lb

## 2023-08-22 DIAGNOSIS — Z125 Encounter for screening for malignant neoplasm of prostate: Secondary | ICD-10-CM | POA: Diagnosis not present

## 2023-08-22 DIAGNOSIS — Z794 Long term (current) use of insulin: Secondary | ICD-10-CM | POA: Diagnosis not present

## 2023-08-22 DIAGNOSIS — F17210 Nicotine dependence, cigarettes, uncomplicated: Secondary | ICD-10-CM | POA: Diagnosis not present

## 2023-08-22 DIAGNOSIS — E782 Mixed hyperlipidemia: Secondary | ICD-10-CM

## 2023-08-22 DIAGNOSIS — Z0000 Encounter for general adult medical examination without abnormal findings: Secondary | ICD-10-CM

## 2023-08-22 DIAGNOSIS — E1142 Type 2 diabetes mellitus with diabetic polyneuropathy: Secondary | ICD-10-CM | POA: Diagnosis not present

## 2023-08-22 DIAGNOSIS — Z79899 Other long term (current) drug therapy: Secondary | ICD-10-CM | POA: Diagnosis not present

## 2023-08-22 DIAGNOSIS — E875 Hyperkalemia: Secondary | ICD-10-CM

## 2023-08-22 LAB — BASIC METABOLIC PANEL WITH GFR
BUN: 22 mg/dL (ref 6–23)
CO2: 24 meq/L (ref 19–32)
Calcium: 9.7 mg/dL (ref 8.4–10.5)
Chloride: 103 meq/L (ref 96–112)
Creatinine, Ser: 0.94 mg/dL (ref 0.40–1.50)
GFR: 81.62 mL/min (ref >60.00)
Glucose, Bld: 116 mg/dL — ABNORMAL HIGH (ref 70–99)
Potassium: 5.5 meq/L — ABNORMAL HIGH (ref 3.5–5.1)
Sodium: 141 meq/L (ref 135–145)

## 2023-08-22 LAB — HEPATIC FUNCTION PANEL
ALT: 22 U/L (ref 0–53)
AST: 18 U/L (ref 0–37)
Albumin: 4.3 g/dL (ref 3.5–5.2)
Alkaline Phosphatase: 62 U/L (ref 39–117)
Bilirubin, Direct: 0.2 mg/dL (ref 0.0–0.3)
Total Bilirubin: 0.7 mg/dL (ref 0.2–1.2)
Total Protein: 6.5 g/dL (ref 6.0–8.3)

## 2023-08-22 LAB — CBC WITH DIFFERENTIAL/PLATELET
Basophils Absolute: 0.1 K/uL (ref 0.0–0.1)
Basophils Relative: 0.6 % (ref 0.0–3.0)
Eosinophils Absolute: 0.2 K/uL (ref 0.0–0.7)
Eosinophils Relative: 1.8 % (ref 0.0–5.0)
HCT: 42.3 % (ref 39.0–52.0)
Hemoglobin: 14.1 g/dL (ref 13.0–17.0)
Lymphocytes Relative: 25.7 % (ref 12.0–46.0)
Lymphs Abs: 2.7 K/uL (ref 0.7–4.0)
MCHC: 33.4 g/dL (ref 30.0–36.0)
MCV: 99.2 fl (ref 78.0–100.0)
Monocytes Absolute: 0.8 K/uL (ref 0.1–1.0)
Monocytes Relative: 7.2 % (ref 3.0–12.0)
Neutro Abs: 6.9 K/uL (ref 1.4–7.7)
Neutrophils Relative %: 64.7 % (ref 43.0–77.0)
Platelets: 235 K/uL (ref 150.0–400.0)
RBC: 4.26 Mil/uL (ref 4.22–5.81)
RDW: 13.4 % (ref 11.5–15.5)
WBC: 10.7 K/uL — ABNORMAL HIGH (ref 4.0–10.5)

## 2023-08-22 LAB — HEMOGLOBIN A1C: Hgb A1c MFr Bld: 6.6 % — ABNORMAL HIGH (ref 4.6–6.5)

## 2023-08-22 LAB — MICROALBUMIN / CREATININE URINE RATIO
Creatinine,U: 159.6 mg/dL
Microalb Creat Ratio: UNDETERMINED mg/g (ref 0.0–30.0)
Microalb, Ur: <0.7 mg/dL

## 2023-08-22 LAB — LIPID PANEL
Cholesterol: 108 mg/dL (ref 0–200)
HDL: 41.5 mg/dL (ref >39.00)
LDL Cholesterol: 48 mg/dL (ref 0–99)
NonHDL: 66.98
Total CHOL/HDL Ratio: 3
Triglycerides: 95 mg/dL (ref 0.0–149.0)
VLDL: 19 mg/dL (ref 0.0–40.0)

## 2023-08-22 MED ORDER — SEMAGLUTIDE (1 MG/DOSE) 4 MG/3ML ~~LOC~~ SOPN
1.0000 mg | PEN_INJECTOR | SUBCUTANEOUS | 5 refills | Status: AC
  Filled 2023-08-22: qty 3, 28d supply, fill #0
  Filled 2023-08-30 – 2023-09-12 (×3): qty 3, 28d supply, fill #1
  Filled 2023-10-13: qty 3, 28d supply, fill #2
  Filled 2023-12-18: qty 3, 28d supply, fill #3
  Filled 2024-02-20: qty 3, 28d supply, fill #4

## 2023-08-25 ENCOUNTER — Ambulatory Visit: Payer: Self-pay | Admitting: Family Medicine

## 2023-08-26 LAB — PSA, TOTAL WITH REFLEX TO PSA, FREE: PSA, Total: 2.8 ng/mL (ref <=4.0)

## 2023-08-27 ENCOUNTER — Other Ambulatory Visit: Payer: Self-pay | Admitting: Thoracic Surgery (Cardiothoracic Vascular Surgery)

## 2023-08-27 DIAGNOSIS — I712 Thoracic aortic aneurysm, without rupture, unspecified: Secondary | ICD-10-CM

## 2023-08-27 NOTE — Addendum Note (Signed)
 Addended by: WATT MIRZA on: 08/27/2023 04:28 PM   Modules accepted: Orders

## 2023-08-29 ENCOUNTER — Other Ambulatory Visit (HOSPITAL_COMMUNITY): Payer: Self-pay

## 2023-08-30 ENCOUNTER — Other Ambulatory Visit (HOSPITAL_COMMUNITY): Payer: Self-pay

## 2023-09-05 ENCOUNTER — Other Ambulatory Visit (HOSPITAL_COMMUNITY): Payer: Self-pay

## 2023-09-05 ENCOUNTER — Other Ambulatory Visit: Payer: Self-pay

## 2023-09-12 ENCOUNTER — Other Ambulatory Visit (INDEPENDENT_AMBULATORY_CARE_PROVIDER_SITE_OTHER)

## 2023-09-12 DIAGNOSIS — E875 Hyperkalemia: Secondary | ICD-10-CM

## 2023-09-12 LAB — BASIC METABOLIC PANEL WITH GFR
BUN: 18 mg/dL (ref 6–23)
CO2: 26 meq/L (ref 19–32)
Calcium: 9.5 mg/dL (ref 8.4–10.5)
Chloride: 106 meq/L (ref 96–112)
Creatinine, Ser: 1.09 mg/dL (ref 0.40–1.50)
GFR: 68.31 mL/min (ref >60.00)
Glucose, Bld: 91 mg/dL (ref 70–99)
Potassium: 5.1 meq/L (ref 3.5–5.1)
Sodium: 142 meq/L (ref 135–145)

## 2023-09-26 ENCOUNTER — Other Ambulatory Visit: Payer: Self-pay

## 2023-09-26 ENCOUNTER — Other Ambulatory Visit (HOSPITAL_COMMUNITY): Payer: Self-pay

## 2023-09-27 ENCOUNTER — Ambulatory Visit (HOSPITAL_COMMUNITY)
Admission: RE | Admit: 2023-09-27 | Discharge: 2023-09-27 | Disposition: A | Discharge status: Home / Self Care | Source: Ambulatory Visit | Attending: Thoracic Surgery (Cardiothoracic Vascular Surgery) | Admitting: Thoracic Surgery (Cardiothoracic Vascular Surgery)

## 2023-09-27 DIAGNOSIS — I712 Thoracic aortic aneurysm, without rupture, unspecified: Secondary | ICD-10-CM | POA: Diagnosis not present

## 2023-09-27 DIAGNOSIS — J439 Emphysema, unspecified: Secondary | ICD-10-CM | POA: Diagnosis not present

## 2023-09-27 DIAGNOSIS — R918 Other nonspecific abnormal finding of lung field: Secondary | ICD-10-CM | POA: Diagnosis not present

## 2023-09-30 ENCOUNTER — Other Ambulatory Visit (HOSPITAL_COMMUNITY): Payer: Self-pay

## 2023-10-04 ENCOUNTER — Ambulatory Visit

## 2023-10-04 VITALS — BP 145/83 | HR 75 | Resp 20 | Ht 70.5 in | Wt 207.5 lb

## 2023-10-04 DIAGNOSIS — I7121 Aneurysm of the ascending aorta, without rupture: Secondary | ICD-10-CM

## 2023-10-04 DIAGNOSIS — I712 Thoracic aortic aneurysm, without rupture, unspecified: Secondary | ICD-10-CM | POA: Insufficient documentation

## 2023-10-04 NOTE — Progress Notes (Signed)
 [address] [place] [address]             [phone]            Connor Moore [id number] 1950-02-05   History of Present Illness:  [name] is a 73 year old man with medical history of hypertension, type 2 diabetes, hyperlipidemia and tobacco user who presents for continued follow up of ascending thoracic aortic aneurysm.  On CT of chest aneurysm measured 4.4 cm.   He presents to the clinic today with his wife.  His blood pressure is slightly elevated at today's visit.  He does not check his blood pressure at home frequently.  He reports that usually his office readings are ok.  He does exercise by golfing and doing yard work.  He denies heavy lifting.  He is still current everyday smoker.  He smokes less than one pack per day.  He is not ready to quit at this time.  He denies chest pain, shortness of breath and lower leg swelling.    Current Outpatient Medications on File Prior to Visit  Medication Sig Dispense Refill   aspirin 81 MG tablet Take 81 mg by mouth daily.     atorvastatin  (LIPITOR) 20 MG tablet Take 1 tablet (20 mg total) by mouth daily. 90 tablet 1   Blood Glucose Monitoring Suppl (ONE TOUCH ULTRA 2) w/Device KIT Use to check blood sugar 3 times a day 1 kit 0   glucose blood (ONETOUCH ULTRA BLUE TEST) test strip Use to check blood sugar 3 times a day 200 each 3   insulin  glargine (LANTUS  SOLOSTAR) 100 UNIT/ML Solostar Pen Inject 25 Units into the skin 2 (two) times daily.     Insulin  Pen Needle (BD PEN NEEDLE NANO U/F) 32G X 4 MM MISC USE ONE DAILY ALONG WITH YOUR INSULIN  PEN 100 each 3   Lancets (ONETOUCH ULTRASOFT) lancets Use to check blood sugar 3 times a day 200 each 3   lisinopril -hydrochlorothiazide  (ZESTORETIC ) 20-25 MG tablet Take 1 tablet by mouth daily. 90 tablet 1   metFORMIN  (GLUCOPHAGE -XR) 500 MG 24 hr tablet Take 2 tablets (1,000 mg total) by mouth 2 (two) times daily. 360 tablet 1   Multiple Vitamin  (MULTIVITAMIN WITH MINERALS) TABS tablet Take 1 tablet by mouth daily.     Multiple Vitamins-Minerals (VISION FORMULA PO) Take 1 tablet by mouth daily.     Semaglutide , 1 MG/DOSE, 4 MG/3ML SOPN Inject 1 mg as directed once a week. 3 mL 5   sildenafil  (VIAGRA ) 100 MG tablet Take 0.5-1 tablets (50-100 mg total) by mouth daily as needed for erectile dysfunction. 12 tablet 3   No current facility-administered medications on file prior to visit.     ROS: Review of Systems  Constitutional: Negative.  Negative for chills and fever.  Respiratory:  Negative for cough, shortness of breath and wheezing.   Cardiovascular: Negative.  Negative for chest pain, palpitations and leg swelling.     BP (!) 145/83 (BP Location: Right Arm, Patient Position: Sitting, Cuff Size: Normal)   Pulse 75   Resp 20   Ht 5' 10.5 (1.791 m)   Wt 207 lb 8 oz (94.1 kg)   SpO2 98% Comment: RA  BMI 29.35 kg/m   Physical Exam Constitutional:      Appearance: Normal appearance.  HENT:     Head: Normocephalic and atraumatic.  Cardiovascular:     Rate  and Rhythm: Normal rate and regular rhythm.     Heart sounds: Normal heart sounds, S1 normal and S2 normal.  Pulmonary:     Effort: Pulmonary effort is normal.     Breath sounds: Normal breath sounds.  Skin:    General: Skin is warm and dry.  Neurological:     General: No focal deficit present.     Mental Status: He is alert and oriented to person, place, and time.      Imaging: CLINICAL DATA:  Follow-up aortic aneurysm.   EXAM: CT CHEST WITHOUT CONTRAST   TECHNIQUE: Multidetector CT imaging of the chest was performed following the standard protocol without IV contrast.   RADIATION DOSE REDUCTION: This exam was performed according to the departmental dose-optimization program which includes automated exposure control, adjustment of the mA and/or kV according to patient size and/or use of iterative reconstruction technique.   COMPARISON:  Chest CT  02/26/2023   FINDINGS: Cardiovascular: The heart is normal in size. No pericardial effusion. Stable mild fusiform aneurysmal dilatation of the ascending thoracic aorta measuring 4.4 cm. Recommend annual imaging followup by CTA or MRA. This recommendation follows 2010 ACCF/AHA/AATS/ACR/ASA/SCA/SCAI/SIR/STS/SVM Guidelines for the Diagnosis and Management of Patients with Thoracic Aortic Disease. Circulation. 2010; 121: Z733-z630. Aortic aneurysm NOS (ICD10-I71.9).   Stable extensive three-vessel coronary artery calcifications and moderate scattered aortic calcifications.   Lipomatous hypertrophy of the interatrial septum again noted.   Mediastinum/Nodes: No mediastinal or hilar mass or lymphadenopathy. The esophagus is unremarkable.   Lungs/Pleura: Stable underlying emphysematous changes. No acute pulmonary process. No infiltrates, edema or effusions.   Stable 7 mm right lower lobe pulmonary nodule on image 80/302.   Stable 3 mm right middle lobe pulmonary nodule on image number 84/302.   No new pulmonary nodules.   Upper Abdomen: No significant upper abdominal findings   Musculoskeletal: No significant bony findings.   IMPRESSION: 1. Stable mild fusiform aneurysmal dilatation of the ascending thoracic aorta measuring 4.4 cm. 2. Stable extensive three-vessel coronary artery calcifications. 3. Stable emphysematous changes. 4. Stable right middle lobe and right lower lobe pulmonary nodules. Recommend resumption of routine lung cancer screening with repeat study in February 2026. 5. No acute pulmonary findings.   Aortic Atherosclerosis (ICD10-I70.0) and Emphysema (ICD10-J43.9).     Electronically Signed   By: MYRTIS Stammer M.D.   On: 09/27/2023 11:30     A/P: Aneurysm of ascending aorta without rupture (HCC) -4.4 cm ascending thoracic aortic aneurysm on CT of chest. We discussed the natural history and and risk factors for growth of ascending aortic aneurysms.  Discussed recommendations to minimize the risk of further expansion or dissection including careful blood pressure control, avoidance of contact sports and heavy lifting, attention to lipid management.  We covered the importance of smoking cessation.  The patient does not yet meet surgical criteria of >5.5cm. The patient is aware of signs and symptoms of aortic dissection and when to present to the emergency department  -Recommend that he start checking blood pressure at home to confirm that current medications are therapeutic.   -Follow up in 6 months with low dose CT scan for continued surveillance of ascending thoracic aortic aneurysm and stable pulmonary nodules    Risk Modification:  Statin:  atorvastatin    Smoking cessation instruction/counseling given:  counseled patient on the dangers of tobacco use, advised patient to stop smoking, and reviewed strategies to maximize success  Patient was counseled on importance of Blood Pressure Control  They are instructed to contact their  Primary Care Physician if they start to have blood pressure readings over 130s/90s. Do not ever stop blood pressure medications on your own, unless instructed by healthcare professional.  Please avoid use of Fluoroquinolones as this can potentially increase your risk of Aortic Rupture and/or Dissection  Patient educated on signs and symptoms of Aortic Dissection, handout also provided in AVS  Manuelita CHRISTELLA Rough, PA-C 10/04/23

## 2023-10-04 NOTE — Patient Instructions (Signed)

## 2023-10-08 ENCOUNTER — Other Ambulatory Visit: Payer: Self-pay

## 2023-10-08 DIAGNOSIS — F17210 Nicotine dependence, cigarettes, uncomplicated: Secondary | ICD-10-CM

## 2023-10-08 DIAGNOSIS — Z122 Encounter for screening for malignant neoplasm of respiratory organs: Secondary | ICD-10-CM

## 2023-10-08 DIAGNOSIS — R911 Solitary pulmonary nodule: Secondary | ICD-10-CM

## 2023-10-08 DIAGNOSIS — Z87891 Personal history of nicotine dependence: Secondary | ICD-10-CM

## 2023-10-14 ENCOUNTER — Other Ambulatory Visit: Payer: Self-pay

## 2023-10-14 ENCOUNTER — Other Ambulatory Visit (HOSPITAL_COMMUNITY): Payer: Self-pay

## 2023-12-16 ENCOUNTER — Other Ambulatory Visit (HOSPITAL_COMMUNITY): Payer: Self-pay

## 2023-12-16 ENCOUNTER — Other Ambulatory Visit: Payer: Self-pay | Admitting: Family Medicine

## 2023-12-16 DIAGNOSIS — E1142 Type 2 diabetes mellitus with diabetic polyneuropathy: Secondary | ICD-10-CM

## 2023-12-16 MED ORDER — LANTUS SOLOSTAR 100 UNIT/ML ~~LOC~~ SOPN
25.0000 [IU] | PEN_INJECTOR | Freq: Two times a day (BID) | SUBCUTANEOUS | 5 refills | Status: AC
  Filled 2023-12-16: qty 60, 120d supply, fill #0

## 2023-12-17 ENCOUNTER — Other Ambulatory Visit: Payer: Self-pay

## 2023-12-17 ENCOUNTER — Other Ambulatory Visit (HOSPITAL_COMMUNITY): Payer: Self-pay

## 2023-12-18 ENCOUNTER — Other Ambulatory Visit: Payer: Self-pay

## 2024-01-05 ENCOUNTER — Other Ambulatory Visit: Payer: Self-pay | Admitting: Family Medicine

## 2024-01-05 DIAGNOSIS — I10 Essential (primary) hypertension: Secondary | ICD-10-CM

## 2024-01-05 DIAGNOSIS — E1142 Type 2 diabetes mellitus with diabetic polyneuropathy: Secondary | ICD-10-CM

## 2024-01-05 DIAGNOSIS — E782 Mixed hyperlipidemia: Secondary | ICD-10-CM

## 2024-01-05 MED ORDER — ATORVASTATIN CALCIUM 20 MG PO TABS
20.0000 mg | ORAL_TABLET | Freq: Every day | ORAL | 1 refills | Status: AC
  Filled 2024-01-05: qty 90, 90d supply, fill #0
  Filled 2024-01-11: qty 90, 90d supply, fill #1

## 2024-01-05 MED ORDER — LISINOPRIL-HYDROCHLOROTHIAZIDE 20-25 MG PO TABS
1.0000 | ORAL_TABLET | Freq: Every day | ORAL | 1 refills | Status: AC
  Filled 2024-01-05: qty 90, 90d supply, fill #0
  Filled 2024-01-11: qty 90, 90d supply, fill #1

## 2024-01-05 MED ORDER — METFORMIN HCL ER 500 MG PO TB24
1000.0000 mg | ORAL_TABLET | Freq: Two times a day (BID) | ORAL | 1 refills | Status: AC
  Filled 2024-01-05: qty 360, 90d supply, fill #0

## 2024-01-06 ENCOUNTER — Other Ambulatory Visit (HOSPITAL_COMMUNITY): Payer: Self-pay

## 2024-01-06 ENCOUNTER — Other Ambulatory Visit: Payer: Self-pay

## 2024-01-11 ENCOUNTER — Other Ambulatory Visit (HOSPITAL_COMMUNITY): Payer: Self-pay

## 2024-01-20 ENCOUNTER — Ambulatory Visit (INDEPENDENT_AMBULATORY_CARE_PROVIDER_SITE_OTHER): Admitting: Family Medicine

## 2024-01-20 ENCOUNTER — Encounter: Payer: Self-pay | Admitting: Family Medicine

## 2024-01-20 VITALS — BP 108/60 | HR 76 | Temp 97.7°F | Ht 70.5 in | Wt 211.5 lb

## 2024-01-20 DIAGNOSIS — H60312 Diffuse otitis externa, left ear: Secondary | ICD-10-CM | POA: Diagnosis not present

## 2024-01-20 DIAGNOSIS — K119 Disease of salivary gland, unspecified: Secondary | ICD-10-CM | POA: Diagnosis not present

## 2024-01-20 MED ORDER — AMOXICILLIN-POT CLAVULANATE 875-125 MG PO TABS: 1.0000 | ORAL_TABLET | Freq: Two times a day (BID) | ORAL | 0 refills | Status: AC

## 2024-01-20 MED ORDER — OFLOXACIN 0.3 % OT SOLN: 5.0000 [drp] | Freq: Every day | OTIC | 0 refills | Status: AC

## 2024-01-20 NOTE — Progress Notes (Signed)
 "    Connor Dawe T. Valora Norell, MD, CAQ Sports Medicine [facility] [address] [place], [address]  Phone: [phone]  FAX: [phone]  Connor Moore - 74 y.o. male  MRN [id number]  Date of Birth: 08/19/1950  Date: 01/20/2024  PCP: Watt Mirza, MD  Referral: Watt Mirza, MD  Chief Complaint  Patient presents with   Facial Swelling   Ear Pain    Left Ear Canal   Adenopathy   Subjective:   Connor Moore is a 74 y.o. very pleasant male patient with Body mass index is 29.92 kg/m. who presents with the following:  Discussed the use of AI scribe software for clinical note transcription with the patient, who gave verbal consent to proceed.   History of Present Illness Connor Moore Gene is a 74 year old male who presents with left ear pain and swelling.  He has left ear pain and swelling, with the swelling extending up his face. The swelling is significant near the ear and diminishes as it moves upward. The inside of his ear canal feels swollen, causing pain when inserting his hearing aid. No fever, chills, cough, or flu-like symptoms are present. The discomfort is localized to the ear area, and he experiences difficulty with his hearing aid due to the swelling.  He regularly uses hearing aids and relies more on his left ear for hearing. He denies any medication allergies, including to Cipro, and has never had a reaction to medications. He is experiencing significant discomfort due to the swelling and pain in the ear.    Review of Systems is noted in the HPI, as appropriate  Objective:   BP 108/60   Pulse 76   Temp 97.7 F (36.5 C) (Temporal)   Ht 5' 10.5 (1.791 m)   Wt 211 lb 8 oz (95.9 kg)   SpO2 98%   BMI 29.92 kg/m   GEN: No acute distress; alert,appropriate. PULM: Breathing comfortably in no respiratory distress PSYCH: Normally interactive.   Left ear canal swollen and boggy in appearance.  The  tympanic membrane is clear.  He also has some tenderness at the corner of the jaw adjacent to the parotid gland and posterior to the ear.  Scattered lymph nodes.  Throat is clear.  Laboratory and Imaging Data:  Assessment and Plan:     ICD-10-CM   1. Acute diffuse otitis externa of left ear  H60.312     2. Parotid discomfort  K11.9      Assessment & Plan Acute otitis externa Swollen and painful ear canal likely due to infection. Differential includes parotid gland infection. No fever or systemic symptoms. Hearing aids may contribute. - Prescribed ofloxacin  ear drops, 10 drops once daily. - Prescribed oral Augmentin , twice daily.  Medication Management during today's office visit: Meds ordered this encounter  Medications   ofloxacin  (FLOXIN ) 0.3 % OTIC solution    Sig: Place 5 drops into the left ear daily. For 7 days    Dispense:  10 mL    Refill:  0   amoxicillin -clavulanate (AUGMENTIN ) 875-125 MG tablet    Sig: Take 1 tablet by mouth 2 (two) times daily.    Dispense:  20 tablet    Refill:  0   There are no discontinued medications.  Orders placed today for conditions managed today: No orders of the defined types were placed in this encounter.   Disposition: No follow-ups on file.  [employer] One speech-to-text software was used  for transcription in this dictation.  Possible transcriptional errors can occur using Animal nutritionist.   Signed,  Jacques DASEN. Clella Mckeel, MD   Outpatient Encounter Medications as of 01/20/2024  Medication Sig   amoxicillin -clavulanate (AUGMENTIN ) 875-125 MG tablet Take 1 tablet by mouth 2 (two) times daily.   aspirin 81 MG tablet Take 81 mg by mouth daily.   atorvastatin  (LIPITOR) 20 MG tablet Take 1 tablet (20 mg total) by mouth daily.   Blood Glucose Monitoring Suppl (ONE TOUCH ULTRA 2) w/Device KIT Use to check blood sugar 3 times a day   glucose blood (ONETOUCH ULTRA BLUE TEST) test strip Use to check blood sugar 3 times a day    insulin  glargine (LANTUS  SOLOSTAR) 100 UNIT/ML Solostar Pen Inject 25 Units into the skin 2 (two) times daily.   Insulin  Pen Needle (BD PEN NEEDLE NANO U/F) 32G X 4 MM MISC USE ONE DAILY ALONG WITH YOUR INSULIN  PEN   Lancets (ONETOUCH ULTRASOFT) lancets Use to check blood sugar 3 times a day   lisinopril -hydrochlorothiazide  (ZESTORETIC ) 20-25 MG tablet Take 1 tablet by mouth daily.   metFORMIN  (GLUCOPHAGE -XR) 500 MG 24 hr tablet Take 2 tablets (1,000 mg total) by mouth 2 (two) times daily.   Multiple Vitamin (MULTIVITAMIN WITH MINERALS) TABS tablet Take 1 tablet by mouth daily.   Multiple Vitamins-Minerals (VISION FORMULA PO) Take 1 tablet by mouth daily.   ofloxacin  (FLOXIN ) 0.3 % OTIC solution Place 5 drops into the left ear daily. For 7 days   Semaglutide , 1 MG/DOSE, 4 MG/3ML SOPN Inject 1 mg as directed once a week.   sildenafil  (VIAGRA ) 100 MG tablet Take 0.5-1 tablets (50-100 mg total) by mouth daily as needed for erectile dysfunction.   No facility-administered encounter medications on file as of 01/20/2024.   "

## 2024-02-18 ENCOUNTER — Encounter: Payer: Self-pay | Admitting: Acute Care

## 2024-02-20 ENCOUNTER — Other Ambulatory Visit (HOSPITAL_COMMUNITY): Payer: Self-pay

## 2024-02-20 ENCOUNTER — Telehealth (HOSPITAL_COMMUNITY): Payer: Self-pay

## 2024-02-21 ENCOUNTER — Telehealth (HOSPITAL_COMMUNITY): Payer: Self-pay

## 2024-02-21 ENCOUNTER — Other Ambulatory Visit (HOSPITAL_COMMUNITY): Payer: Self-pay

## 2024-02-21 NOTE — Telephone Encounter (Signed)
 PA request has been Received. New Encounter has been or will be created for follow up. For additional info see Pharmacy Prior Auth telephone encounter from 02/21/24.

## 2024-02-21 NOTE — Telephone Encounter (Signed)
 Pharmacy Patient Advocate Encounter   Received notification from Pt Calls Messages that prior authorization for Ozempic  (1 MG/DOSE) 4MG /3ML pen-injectors  is required/requested.   Insurance verification completed.   The patient is insured through [employer] ADVANTAGE/RX ADVANCE.   Per test claim: PA required; PA submitted to above mentioned insurance via Latent Key/confirmation #/EOC [id number] Status is pending

## 2024-02-24 ENCOUNTER — Ambulatory Visit: Admitting: Family Medicine
